# Patient Record
Sex: Female | Born: 1985 | Race: Black or African American | Hispanic: No | Marital: Single | Smoking: Former smoker
Health system: Southern US, Community
[De-identification: ages and names within clinical notes are randomized; demographics above are authoritative.]

## PROBLEM LIST (undated history)

## (undated) ENCOUNTER — Inpatient Hospital Stay (HOSPITAL_COMMUNITY): Payer: Self-pay

## (undated) DIAGNOSIS — F32A Depression, unspecified: Secondary | ICD-10-CM

## (undated) DIAGNOSIS — F419 Anxiety disorder, unspecified: Secondary | ICD-10-CM

## (undated) DIAGNOSIS — R1110 Vomiting, unspecified: Secondary | ICD-10-CM

## (undated) DIAGNOSIS — IMO0001 Reserved for inherently not codable concepts without codable children: Secondary | ICD-10-CM

## (undated) DIAGNOSIS — O3430 Maternal care for cervical incompetence, unspecified trimester: Secondary | ICD-10-CM

## (undated) DIAGNOSIS — A6000 Herpesviral infection of urogenital system, unspecified: Secondary | ICD-10-CM

## (undated) DIAGNOSIS — K219 Gastro-esophageal reflux disease without esophagitis: Secondary | ICD-10-CM

## (undated) DIAGNOSIS — F329 Major depressive disorder, single episode, unspecified: Secondary | ICD-10-CM

## (undated) DIAGNOSIS — B999 Unspecified infectious disease: Secondary | ICD-10-CM

## (undated) DIAGNOSIS — R42 Dizziness and giddiness: Secondary | ICD-10-CM

---
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
## 2010-11-27 ENCOUNTER — Encounter: Payer: Self-pay | Admitting: Obstetrics & Gynecology

## 2011-12-06 ENCOUNTER — Emergency Department (HOSPITAL_COMMUNITY)
Admission: EM | Admit: 2011-12-06 | Discharge: 2011-12-06 | Disposition: A | Discharge status: Home / Self Care | Payer: Medicaid - Out of State | Attending: Emergency Medicine | Admitting: Emergency Medicine

## 2011-12-06 ENCOUNTER — Emergency Department (HOSPITAL_COMMUNITY): Payer: Medicaid - Out of State

## 2011-12-06 ENCOUNTER — Encounter (HOSPITAL_COMMUNITY): Payer: Self-pay | Admitting: *Deleted

## 2011-12-06 DIAGNOSIS — R110 Nausea: Secondary | ICD-10-CM | POA: Insufficient documentation

## 2011-12-06 DIAGNOSIS — H8110 Benign paroxysmal vertigo, unspecified ear: Secondary | ICD-10-CM

## 2011-12-06 DIAGNOSIS — H538 Other visual disturbances: Secondary | ICD-10-CM | POA: Insufficient documentation

## 2011-12-06 MED ORDER — MECLIZINE HCL 25 MG PO TABS: 25.0000 mg | ORAL_TABLET | Freq: Three times a day (TID) | ORAL | Status: AC | PRN

## 2011-12-06 MED ORDER — MECLIZINE HCL 25 MG PO TABS
25.0000 mg | ORAL_TABLET | Freq: Once | ORAL | Status: AC
  Administered 2011-12-06: 25 mg via ORAL
  Filled 2011-12-06: qty 1

## 2011-12-06 MED ORDER — ONDANSETRON 4 MG PO TBDP
4.0000 mg | ORAL_TABLET | Freq: Once | ORAL | Status: AC
  Administered 2011-12-06: 4 mg via ORAL
  Filled 2011-12-06: qty 1

## 2011-12-06 MED ORDER — ONDANSETRON HCL 4 MG PO TABS: 4.0000 mg | ORAL_TABLET | Freq: Four times a day (QID) | ORAL | Status: AC

## 2011-12-06 NOTE — ED Notes (Signed)
Reports having nausea, dizziness, change in vision and hearing x over one year and has been to 3 diff drs for it. No acute distress noted at triage.

## 2011-12-06 NOTE — ED Provider Notes (Signed)
History     CSN: [id number]  Arrival date & time 12/06/11  1751   First MD Initiated Contact with Patient 12/06/11 1819      Chief Complaint  Patient presents with  . Nausea  . Dizziness  . Blurred Vision    (Consider location/radiation/quality/duration/timing/severity/associated sxs/prior treatment) HPI Comments: Patient reports 3 months of nausea, dizziness, blurry vision that occurs intermittently. She seen several doctors for this and told she may have a middle ear problem. She's had recurrent episodes of dizziness with position changes associated with the room spinning. Associated with nausea and blurry vision. No chest pain or shortness of breath or syncope. She denies any weakness, numbness, tingling, headache. She's not take any medications for it. She has not seen an ENT specialist.  The history is provided by the patient.    History reviewed. No pertinent past medical history.  History reviewed. No pertinent past surgical history.  History reviewed. No pertinent family history.  History  Substance Use Topics  . Smoking status: Not on file  . Smokeless tobacco: Not on file  . Alcohol Use: Yes     occ    OB History    Grav Para Term Preterm Abortions TAB SAB Ect Mult Living                  Review of Systems  Constitutional: Negative for fever and activity change.  HENT: Negative for congestion and rhinorrhea.   Eyes: Positive for visual disturbance.  Respiratory: Negative for chest tightness and shortness of breath.   Cardiovascular: Negative for chest pain.  Gastrointestinal: Positive for nausea. Negative for vomiting and abdominal pain.  Genitourinary: Negative for dysuria and hematuria.  Musculoskeletal: Negative for back pain.  Skin: Negative for rash.  Neurological: Positive for dizziness and light-headedness. Negative for weakness.    Allergies  Review of patient's allergies indicates no known allergies.  Home Medications  No current  outpatient prescriptions on file.  BP 135/82  Pulse 91  Temp(Src) 98.2 F (36.8 C) (Oral)  Ht 5\' 6"  (1.676 m)  Wt 290 lb (131.543 kg)  BMI 46.81 kg/m2  SpO2 99%  LMP 12/04/2011  Physical Exam  Constitutional: She is oriented to person, place, and time. She appears well-developed and well-nourished. No distress.  HENT:  Head: Normocephalic and atraumatic.  Mouth/Throat: Oropharynx is clear and moist. No oropharyngeal exudate.  Eyes: Conjunctivae are normal. Pupils are equal, round, and reactive to light.  Neck: Normal range of motion.  Cardiovascular: Normal rate, regular rhythm and normal heart sounds.   Pulmonary/Chest: Effort normal and breath sounds normal. No respiratory distress.  Abdominal: Soft. There is no tenderness. There is no rebound and no guarding.  Musculoskeletal: Normal range of motion. She exhibits no edema and no tenderness.  Neurological: She is alert and oriented to person, place, and time. No cranial nerve deficit.       No face asymmetry, no nystagmus, 5 out of 5 strength throughout, no ataxia finger to nose, normal gait. Catch up saccades on head impulse testing Test of skew negative  Skin: Skin is warm.    ED Course  Procedures (including critical care time)  Labs Reviewed - No data to display Ct Head Wo Contrast  12/06/2011  *RADIOLOGY REPORT*  Clinical Data: Nausea.  Dizziness.  Blurry vision.  CT HEAD WITHOUT CONTRAST  Technique:  Contiguous axial images were obtained from the base of the skull through the vertex without contrast.  Comparison: None  Findings: No mass lesion,  mass effect, midline shift, hydrocephalus, hemorrhage.  No territorial ischemia or acute infarction.  Paranasal sinuses are within normal limits.  IMPRESSION: Negative CT head.  Original Report Authenticated By: Andreas Newport, M.D.     No diagnosis found.    MDM  Intermittent vertigo, nausea, change in vision. And off problem for several months. Normal neurological exam  today. Catch up saccades suggestive of peripheral vestibular nerve problem We'll treat as benign positional vertigo and followup with ENT.  CT head negative symptoms improved with meclizine. No further dizziness. Normal gait.   Date: 12/06/2011  Rate: 82  Rhythm: normal sinus rhythm  QRS Axis: normal  Intervals: normal  ST/T Wave abnormalities: normal  Conduction Disutrbances:none  Narrative Interpretation:   Old EKG Reviewed: unchanged         Glynn Octave, MD 12/06/11 1939

## 2011-12-06 NOTE — ED Notes (Signed)
Patient transported to CT 

## 2012-01-28 ENCOUNTER — Emergency Department (HOSPITAL_COMMUNITY)
Admission: EM | Admit: 2012-01-28 | Discharge: 2012-01-29 | Disposition: A | Discharge status: Home / Self Care | Payer: Medicaid Other | Attending: Emergency Medicine | Admitting: Emergency Medicine

## 2012-01-28 ENCOUNTER — Encounter (HOSPITAL_COMMUNITY): Payer: Self-pay | Admitting: Emergency Medicine

## 2012-01-28 DIAGNOSIS — N939 Abnormal uterine and vaginal bleeding, unspecified: Secondary | ICD-10-CM

## 2012-01-28 DIAGNOSIS — A599 Trichomoniasis, unspecified: Secondary | ICD-10-CM | POA: Insufficient documentation

## 2012-01-28 DIAGNOSIS — N898 Other specified noninflammatory disorders of vagina: Secondary | ICD-10-CM | POA: Insufficient documentation

## 2012-01-28 DIAGNOSIS — IMO0001 Reserved for inherently not codable concepts without codable children: Secondary | ICD-10-CM | POA: Insufficient documentation

## 2012-01-28 HISTORY — DX: Reserved for inherently not codable concepts without codable children: IMO0001

## 2012-01-28 LAB — URINALYSIS, ROUTINE W REFLEX MICROSCOPIC
Bilirubin Urine: NEGATIVE
Glucose, UA: NEGATIVE mg/dL
Ketones, ur: NEGATIVE mg/dL
Nitrite: NEGATIVE
Protein, ur: NEGATIVE mg/dL
Specific Gravity, Urine: 1.028 (ref 1.005–1.030)
Urobilinogen, UA: 1 mg/dL (ref 0.0–1.0)
pH: 6.5 (ref 5.0–8.0)

## 2012-01-28 LAB — GLUCOSE, CAPILLARY: Glucose-Capillary: 93 mg/dL (ref 70–99)

## 2012-01-28 LAB — URINE MICROSCOPIC-ADD ON

## 2012-01-28 LAB — POCT PREGNANCY, URINE: Preg Test, Ur: NEGATIVE

## 2012-01-28 NOTE — ED Notes (Signed)
Patient states that she has been vaginal bleeding and "spotting" for the past six months; patient also reports UTI symptoms that started yesterday -- patient reports severe itching and burning and that she has taken AZO.  Patient states that she has had several relatives that have issues with spotting and needed to have surgery to alleviate symptoms. Patient reports that she does not have a primary care physician and wants to be worked up for both issues.

## 2012-01-29 LAB — WET PREP, GENITAL: Yeast Wet Prep HPF POC: NONE SEEN

## 2012-01-29 MED ORDER — LIDOCAINE HCL (PF) 1 % IJ SOLN
INTRAMUSCULAR | Status: AC
  Administered 2012-01-29: 2.1 mL
  Filled 2012-01-29: qty 5

## 2012-01-29 MED ORDER — AZITHROMYCIN 250 MG PO TABS
1000.0000 mg | ORAL_TABLET | Freq: Once | ORAL | Status: AC
  Administered 2012-01-29: 1000 mg via ORAL
  Filled 2012-01-29: qty 4

## 2012-01-29 MED ORDER — CEFTRIAXONE SODIUM 250 MG IJ SOLR
250.0000 mg | Freq: Once | INTRAMUSCULAR | Status: AC
  Administered 2012-01-29: 250 mg via INTRAMUSCULAR
  Filled 2012-01-29: qty 250

## 2012-01-29 MED ORDER — METRONIDAZOLE 500 MG PO TABS
2000.0000 mg | ORAL_TABLET | Freq: Once | ORAL | Status: AC
  Administered 2012-01-29: 2000 mg via ORAL
  Filled 2012-01-29: qty 4

## 2012-01-29 NOTE — ED Notes (Signed)
Natalia Leatherwood, FNP at bedside with pelvic cart and RN.

## 2012-01-29 NOTE — ED Notes (Signed)
Patient currently resting quietly in bed; no respiratory or acute distress noted.  Patient updated on plan of care; informed patient that we are currently waiting on EDP to come and assess patient.  Patient has no other questions or concerns at this time; will continue to monitor.

## 2012-01-29 NOTE — ED Notes (Signed)
Pt states understanding of discharge instructions 

## 2012-01-29 NOTE — ED Notes (Signed)
Patient currently sitting up in bed; no respiratory or acute distress noted.  Patient updated on plan of care; informed patient that we are currently waiting on disposition from FNP.  Patient has no other questions or concerns at this time; will continue to monitor.

## 2012-01-29 NOTE — ED Notes (Signed)
Patient currently sitting up in bed; no respiratory or acute distress noted.  Patient moved to CDU 11 to prepare for pelvic exam; pelvic cart set up at bedside.  Natalia Leatherwood, FNP at bedside.  Patient has no other questions or concerns at this time; will continue to monitor.

## 2012-01-29 NOTE — Discharge Instructions (Signed)
Please review the instructions below. You were seen in the emergency department tonight for your vaginal discharge, irritation  and persistent vaginal bleeding. Your pelvic exam tonight shows that you have Trichomonas. We have treated that infection here in the emergency department. You are having a small to moderate amount of bleeding,  your remaining pelvic exam appeared normal. You will need to arrange follow up with a gynecologist or the [facility] for further evaluation of your abnormal vaginal bleeding. We also encourage you to arrange follow up at the [facility] Department STD clinic for further screening such as Syphilis and HIV screening. Return to the emergency department for worsening symptoms otherwise follow up as discussed.   Trichomoniasis Trichomoniasis is an infection, caused by the Trichomonas organism, that affects both women and men. In women, the outer female genitalia and the vagina are affected. In men, the penis is mainly affected, but the prostate and other reproductive organs can also be involved. Trichomoniasis is a sexually transmitted disease (STD) and is most often passed to another person through sexual contact. The majority of people who get trichomoniasis do so from a sexual encounter and are also at risk for other STDs. CAUSES   Sexual intercourse with an infected partner.   It can be present in swimming pools or hot tubs.  SYMPTOMS   Abnormal gray-green frothy vaginal discharge in women.   Vaginal itching and irritation in women.   Itching and irritation of the area outside the vagina in women.   Penile discharge with or without pain in males.   Inflammation of the urethra (urethritis), causing painful urination.   Bleeding after sexual intercourse.  RELATED COMPLICATIONS  Pelvic inflammatory disease.   Infection of the uterus (endometritis).   Infertility.   Tubal (ectopic) pregnancy.   It can be associated with other  STDs, including gonorrhea and chlamydia, hepatitis B, and HIV.  COMPLICATIONS DURING PREGNANCY  Early (premature) delivery.   Premature rupture of the membranes (PROM).   Low birth weight.  DIAGNOSIS   Visualization of Trichomonas under the microscope from the vagina discharge.   Ph of the vagina greater than 4.5, tested with a test tape.   Trich Rapid Test.   Culture of the organism, but this is not usually needed.   It may be found on a Pap test.   Having a "strawberry cervix,"which means the cervix looks very red like a strawberry.  TREATMENT   You may be given medication to fight the infection. Inform your caregiver if you could be or are pregnant. Some medications used to treat the infection should not be taken during pregnancy.   Over-the-counter medications or creams to decrease itching or irritation may be recommended.   Your sexual partner will need to be treated if infected.  HOME CARE INSTRUCTIONS   Take all medication prescribed by your caregiver.   Take over-the-counter medication for itching or irritation as directed by your caregiver.   Do not have sexual intercourse while you have the infection.   Do not douche or wear tampons.   Discuss your infection with your partner, as your partner may have acquired the infection from you. Or, your partner may have been the person who transmitted the infection to you.   Have your sex partner examined and treated if necessary.   Practice safe, informed, and protected sex.   See your caregiver for other STD testing.  SEEK MEDICAL CARE IF:   You still have symptoms after you finish the  medication.   You have an oral temperature above 102 F (38.9 C).   You develop belly (abdominal) pain.   You have pain when you urinate.   You have bleeding after sexual intercourse.   You develop a rash.   The medication makes you sick or makes you throw up (vomit).  Document Released: 04/18/2001 Document Revised:  10/12/2011 Document Reviewed: 05/14/2009 [facility] Patient Information 2012 [place], [place].Abnormal Vaginal Bleeding Abnormal vaginal bleeding means bleeding from the vagina that is not your normal menstrual period. Bleeding may be heavy or light. It may last for days or come and go. There are many problems that may cause this. HOME CARE  Keep track of your periods on a calendar if they are not regular.   Write down:   How often pads or tampons are changed.   The size and number of clots, if there are any.   A change in the color of the blood.   A change in the amount of blood.   Any smell.   The time and strength of cramps or pain.   Limit activity as told.   Eat a healthy diet.   Do not have sex (intercourse) until your doctor says it is okay.   Never have unprotected sex unless you are trying to get pregnant.   Only take medicine as told by your doctor.  GET HELP RIGHT AWAY IF:   You get dizzy or feel faint when standing up.   You have to change pads or tampons more than once an hour.   You feel a sudden change in your pain.   You start bleeding heavily.   You develop a fever.  MAKE SURE YOU:  Understand these instructions.   Will watch your condition.   Will get help right away if you are not doing well or get worse.  Document Released: 08/20/2009 Document Revised: 10/12/2011 Document Reviewed: 08/20/2009 [facility] Patient Information 2012 [place], [place].Abnormal Vaginal Bleeding Abnormal vaginal bleeding means bleeding from the vagina that is not your normal menstrual period. Bleeding may be heavy or light. It may last for days or come and go. There are many problems that may cause this. HOME CARE  Keep track of your periods on a calendar if they are not regular.   Write down:   How often pads or tampons are changed.   The size and number of clots, if there are any.   A change in the color of the blood.   A change in the amount of blood.   Any  smell.   The time and strength of cramps or pain.   Limit activity as told.   Eat a healthy diet.   Do not have sex (intercourse) until your doctor says it is okay.   Never have unprotected sex unless you are trying to get pregnant.   Only take medicine as told by your doctor.  GET HELP RIGHT AWAY IF:   You get dizzy or feel faint when standing up.   You have to change pads or tampons more than once an hour.   You feel a sudden change in your pain.   You start bleeding heavily.   You develop a fever.  MAKE SURE YOU:  Understand these instructions.   Will watch your condition.   Will get help right away if you are not doing well or get worse.  Document Released: 08/20/2009 Document Revised: 10/12/2011 Document Reviewed: 08/20/2009 [facility] Patient Information 2012 [place], [place].

## 2012-01-29 NOTE — ED Notes (Signed)
Patient currently sitting up in bed; no respiratory or acute distress noted.  Patient updated on plan of care; informed patient that Samantha Leatherwood, FNP will be in to talk with patient.  No new orders at this time.  Patient denies pain and nausea at this time.  Patient has no other questions or concerns; will continue to monitor.

## 2012-01-29 NOTE — ED Provider Notes (Signed)
History     CSN: [id number]  Arrival date & time 01/28/12  2047   First MD Initiated Contact with Patient 01/28/12 2344      Chief Complaint  Patient presents with  . Vaginal Bleeding    HPI: Patient is a 26 y.o. female presenting with vaginal bleeding. The history is provided by the patient.  Vaginal Bleeding This is a chronic problem. The current episode started more than 1 month ago. The problem occurs intermittently. The problem has been unchanged. Associated symptoms include urinary symptoms. Pertinent negatives include no abdominal pain, change in bowel habit, fever, nausea or vomiting. The symptoms are aggravated by nothing. She has tried nothing for the symptoms.   patient reports vaginal irritation and itching for approximately 2 months. She recently has had increased dysuria. Admits to recent unprotected intercourse. Patient also reports a several month history of dysfunctional uterine bleeding. States she has bleeding every 2 to 3 weeks for months. Denies significant abd pain. States that this is not new but she would like to be evaluated for. Denies any significant abdominal pain. Denies fever, nausea, vomiting or other associated symptoms.   Past Medical History  Diagnosis Date  . No significant past medical history     History reviewed. No pertinent past surgical history.  History reviewed. No pertinent family history.  History  Substance Use Topics  . Smoking status: Not on file  . Smokeless tobacco: Not on file  . Alcohol Use: Yes     occ    OB History    Grav Para Term Preterm Abortions TAB SAB Ect Mult Living                  Review of Systems  Constitutional: Negative.  Negative for fever.  HENT: Negative.   Eyes: Negative.   Respiratory: Negative.   Cardiovascular: Negative.   Gastrointestinal: Negative.  Negative for nausea, vomiting, abdominal pain and change in bowel habit.  Genitourinary: Positive for dysuria and vaginal bleeding.    Musculoskeletal: Negative.   Skin: Negative.   Neurological: Negative.   Hematological: Negative.   Psychiatric/Behavioral: Negative.     Allergies  Review of patient's allergies indicates no known allergies.  Home Medications  No current outpatient prescriptions on file.  BP 125/83  Pulse 60  Temp(Src) 97.2 F (36.2 C) (Oral)  Resp 19  SpO2 99%  LMP 12/29/2011  Physical Exam  Constitutional: She is oriented to person, place, and time. She appears well-developed and well-nourished.  HENT:  Head: Normocephalic and atraumatic.  Eyes: Conjunctivae are normal.  Neck: Neck supple.  Cardiovascular: Normal rate and regular rhythm.   Pulmonary/Chest: Effort normal and breath sounds normal.  Abdominal: Soft. Bowel sounds are normal. Hernia confirmed negative in the right inguinal area and confirmed negative in the left inguinal area.  Genitourinary: Uterus normal. Cervix exhibits no motion tenderness, no discharge and no friability. Right adnexum displays no mass, no tenderness and no fullness. Left adnexum displays no mass, no tenderness and no fullness. There is bleeding around the vagina. No tenderness around the vagina. Vaginal discharge found.       Mod amount vag bleeding  Musculoskeletal: Normal range of motion.  Lymphadenopathy:       Right: No inguinal adenopathy present.       Left: No inguinal adenopathy present.  Neurological: She is alert and oriented to person, place, and time.  Skin: Skin is warm and dry. No erythema.  Psychiatric: She has a normal mood and affect.  ED Course  Pelvic exam Date/Time: 01/29/2012 1:40 AM Performed by: Leanne Chang Authorized by: Leanne Chang Consent: Verbal consent obtained. Risks and benefits: risks, benefits and alternatives were discussed Consent given by: patient Patient understanding: patient states understanding of the procedure being performed Required items: required blood products, implants, devices, and  special equipment available Patient identity confirmed: verbally with patient and arm band Local anesthesia used: no Patient sedated: no Patient tolerance: Patient tolerated the procedure well with no immediate complications.  Findings and clinical impression discussed w/ pt. Will treat for Trich and encourage close f/u for further screening at [facility] and at [facility] for further eval of her abnormal vaginal bleeding. Pt agreeable w/ plan.  Labs Reviewed  URINALYSIS, ROUTINE W REFLEX MICROSCOPIC - Abnormal; Notable for the following:    APPearance CLOUDY (*)    Hgb urine dipstick MODERATE (*)    Leukocytes, UA MODERATE (*)    All other components within normal limits  URINE MICROSCOPIC-ADD ON - Abnormal; Notable for the following:    Squamous Epithelial / LPF FEW (*)    All other components within normal limits  WET PREP, GENITAL - Abnormal; Notable for the following:    Trich, Wet Prep MODERATE (*)    Clue Cells Wet Prep HPF POC FEW (*)    WBC, Wet Prep HPF POC FEW (*)    All other components within normal limits  POCT PREGNANCY, URINE  GLUCOSE, CAPILLARY  GC/CHLAMYDIA PROBE AMP, GENITAL   No results found.   1. Trichomoniasis   2. Abnormal vaginal bleeding       MDM  HPI/PE and clinical findings c/w 1. Trichomonas vaginitis 2. Abnormal vaginal bleeding        Leanne Chang, NP 01/31/12 1401

## 2012-01-30 LAB — GC/CHLAMYDIA PROBE AMP, GENITAL
Chlamydia, DNA Probe: NEGATIVE
GC Probe Amp, Genital: NEGATIVE

## 2012-01-31 NOTE — ED Provider Notes (Signed)
Medical screening examination/treatment/procedure(s) were performed by non-physician practitioner and as supervising physician I was immediately available for consultation/collaboration.  Vallerie Hentz K Adriena Manfre-Rasch, MD 01/31/12 2301 

## 2012-02-20 ENCOUNTER — Emergency Department (INDEPENDENT_AMBULATORY_CARE_PROVIDER_SITE_OTHER)
Admission: EM | Admit: 2012-02-20 | Discharge: 2012-02-20 | Disposition: A | Discharge status: Home / Self Care | Payer: Self-pay | Source: Home / Self Care | Attending: Emergency Medicine | Admitting: Emergency Medicine

## 2012-02-20 ENCOUNTER — Encounter (HOSPITAL_COMMUNITY): Payer: Self-pay | Admitting: *Deleted

## 2012-02-20 DIAGNOSIS — N926 Irregular menstruation, unspecified: Secondary | ICD-10-CM

## 2012-02-20 DIAGNOSIS — N938 Other specified abnormal uterine and vaginal bleeding: Secondary | ICD-10-CM

## 2012-02-20 HISTORY — DX: Dizziness and giddiness: R42

## 2012-02-20 LAB — POCT PREGNANCY, URINE: Preg Test, Ur: NEGATIVE

## 2012-02-20 LAB — POCT URINALYSIS DIP (DEVICE)
Glucose, UA: NEGATIVE mg/dL
Hgb urine dipstick: NEGATIVE
Ketones, ur: NEGATIVE mg/dL
Leukocytes, UA: NEGATIVE
Nitrite: NEGATIVE
Protein, ur: 30 mg/dL — AB
Specific Gravity, Urine: >=1.03 (ref 1.005–1.030)
Urobilinogen, UA: 0.2 mg/dL (ref 0.0–1.0)
pH: 6 (ref 5.0–8.0)

## 2012-02-20 LAB — WET PREP, GENITAL
Trich, Wet Prep: NONE SEEN
Yeast Wet Prep HPF POC: NONE SEEN

## 2012-02-20 MED ORDER — NORGESTIM-ETH ESTRAD TRIPHASIC 0.18/0.215/0.25 MG-35 MCG PO TABS: 1.0000 | ORAL_TABLET | Freq: Every day | ORAL | Status: DC

## 2012-02-20 NOTE — Discharge Instructions (Signed)
Abnormal Uterine Bleeding Abnormal uterine bleeding can have many causes. Some cases are simply treated, while others are more serious. There are several kinds of bleeding that is considered abnormal, including:  Bleeding between periods.   Bleeding after sexual intercourse.   Spotting anytime in the menstrual cycle.   Bleeding heavier or more than normal.   Bleeding after menopause.  CAUSES  There are many causes of abnormal uterine bleeding. It can be present in teenagers, pregnant women, women during their reproductive years, and women who have reached menopause. Your caregiver will look for the more common causes depending on your age, signs, symptoms and your particular circumstance. Most cases are not serious and can be treated. Even the more serious causes, like cancer of the female organs, can be treated adequately if found in the early stages. That is why all types of bleeding should be evaluated and treated as soon as possible. DIAGNOSIS  Diagnosing the cause may take several kinds of tests. Your caregiver may:  Take a complete history of the type of bleeding.   Perform a complete physical exam and Pap smear.   Take an ultrasound on the abdomen showing a picture of the female organs and the pelvis.   Inject dye into the uterus and Fallopian tubes and X-ray them (hysterosalpingogram).   Place fluid in the uterus and do an ultrasound (sonohysterogrqphy).   Take a CT scan to examine the female organs and pelvis.   Take an MRI to examine the female organs and pelvis. There is no X-ray involved with this procedure.   Look inside the uterus with a telescope that has a light at the end (hysteroscopy).   Scrap the inside of the uterus to get tissue to examine (Dilatation and Curettage, D&C).   Look into the pelvis with a telescope that has a light at the end (laparoscopy). This is done through a very small cut (incision) in the abdomen.  TREATMENT  Treatment will depend on the  cause of the abnormal bleeding. It can include:  Doing nothing to allow the problem to take care of itself over time.   Hormone treatment.   Birth control pills.   Treating the medical condition causing the problem.   Laparoscopy.   Major or minor surgery   Destroying the lining of the uterus with electrical currant, laser, freezing or heat (uterine ablation).  HOME CARE INSTRUCTIONS   Follow your caregiver's recommendation on how to treat your problem.   See your caregiver if you missed a menstrual period and think you may be pregnant.   If you are bleeding heavily, count the number of pads/tampons you use and how often you have to change them. Tell this to your caregiver.   Avoid sexual intercourse until the problem is controlled.  SEEK MEDICAL CARE IF:   You have any kind of abnormal bleeding mentioned above.   You feel dizzy at times.   You are 26 years old and have not had a menstrual period yet.  SEEK IMMEDIATE MEDICAL CARE IF:   You pass out.   You are changing pads/tampons every 15 to 30 minutes.   You have belly (abdominal) pain.   You have a temperature of 100 F (37.8 C) or higher.   You become sweaty or weak.   You are passing large blood clots from the vagina.   You start to feel sick to your stomach (nauseous) and throw up (vomit).  Document Released: 10/23/2005 Document Revised: 10/12/2011 Document Reviewed: 03/18/2009 [employer]   Patient Information 2012 [employer]. 

## 2012-02-20 NOTE — ED Notes (Signed)
4 months of irregular vaginal bleeding which has gotten worse.    3 days sago she started having fever/chills N, V and D . Today she has stopped vomiting and has had 1 loose stool

## 2012-02-20 NOTE — ED Provider Notes (Signed)
Chief Complaint  Patient presents with  . Vaginal Bleeding    History of Present Illness:   Samantha Guzman is a 26 year old female who comes in today because of a 4 month history of irregular vaginal bleeding. Sometimes she has spotting and sometimes a normal menstrual flow, but this can occur randomly throughout the month with no particular pattern. She denies any pain or passage of tissue or clots. She has not been sexually active for about a year. She did have STD testing about a month ago this was all negative. The patient recalls she had something similar several years ago and had to take birth control pills to control her menses. She states the birth control pills does work quite well and while she was on them she had very regular periods.  She also had what she thinks was food poisoning from Saturday March 13 through Monday, March 15 with nausea, vomiting, and diarrhea. She also felt fatigued and chilled. She had eaten at a [place] buffet prior to onset of symptoms.  Review of Systems:  Other than noted above, the patient denies any of the following symptoms: Systemic:  No fever, chills, sweats, fatigue, or weight loss. GI:  No abdominal pain, nausea, anorexia, vomiting, diarrhea, constipation, melena or hematochezia. GU:  No dysuria, frequency, urgency, hematuria, vaginal discharge, itching, or abnormal vaginal bleeding. Skin:  No rash or itching.   PMFSH:  Past medical history, family history, social history, meds, and allergies were reviewed.  Physical Exam:   Vital signs:  BP 110/62  Pulse 78  Temp(Src) 98.5 F (36.9 C) (Oral)  Resp 16  SpO2 98%  LMP 11/28/2011 General:  Alert, oriented and in no distress. Lungs:  Breath sounds clear and equal bilaterally.  No wheezes, rales or rhonchi. Heart:  Regular rhythm.  No gallops or murmers. Abdomen:  Soft, flat and non-distended.  No organomegaly or mass.  No tenderness, guarding or rebound.  Bowel sounds normally active. Pelvic exam:   Normal external genitalia. Vaginal and cervical mucosa unremarkable. There was a small amount of blood in the vaginal vault. No clots or tissue. The cervix appeared normal. No cervical motion tenderness. Uterus was anterior, normal in size and shape without any tenderness or masses. No adnexal tenderness or mass. Skin:  Clear, warm and dry.  Labs:   Results for orders placed during the hospital encounter of 02/20/12  POCT URINALYSIS DIP (DEVICE)      Component Value Range   Glucose, UA NEGATIVE  NEGATIVE (mg/dL)   Bilirubin Urine SMALL (*) NEGATIVE    Ketones, ur NEGATIVE  NEGATIVE (mg/dL)   Specific Gravity, Urine >=1.030  1.005 - 1.030    Hgb urine dipstick NEGATIVE  NEGATIVE    pH 6.0  5.0 - 8.0    Protein, ur 30 (*) NEGATIVE (mg/dL)   Urobilinogen, UA 0.2  0.0 - 1.0 (mg/dL)   Nitrite NEGATIVE  NEGATIVE    Leukocytes, UA NEGATIVE  NEGATIVE     Other Labs Obtained at Urgent Care Center:  GC and Chlamydia DNA probe was obtained as well.  Results are pending at this time and we will call about any positive results. Her urine pregnancy test was negative.  Assessment:  The encounter diagnosis was Dysfunctional uterine bleeding.  Plan:   1.  The following meds were prescribed:   New Prescriptions   NORGESTIMATE-ETHINYL ESTRADIOL TRIPHASIC (TRI-SPRINTEC) 0.18/0.215/0.25 MG-35 MCG TABLET    Take 1 tablet by mouth daily.   2.  The patient was instructed in  symptomatic care and handouts were given. 3.  The patient was told to return if becoming worse in any way, if no better in 3 or 4 days, and given some red flag symptoms that would indicate earlier return.  Follow up:  The patient was told to follow up with Dr.Lavoie within the next month.     Reuben Likes, MD 02/20/12 [phone]

## 2012-02-21 LAB — GC/CHLAMYDIA PROBE AMP, GENITAL: Chlamydia, DNA Probe: NEGATIVE

## 2012-03-18 ENCOUNTER — Encounter (HOSPITAL_COMMUNITY): Payer: Self-pay | Admitting: Emergency Medicine

## 2012-03-18 ENCOUNTER — Emergency Department (INDEPENDENT_AMBULATORY_CARE_PROVIDER_SITE_OTHER)
Admission: EM | Admit: 2012-03-18 | Discharge: 2012-03-18 | Disposition: A | Discharge status: Home / Self Care | Payer: Self-pay | Source: Home / Self Care | Attending: Emergency Medicine | Admitting: Emergency Medicine

## 2012-03-18 DIAGNOSIS — R300 Dysuria: Secondary | ICD-10-CM

## 2012-03-18 LAB — POCT URINALYSIS DIP (DEVICE)
Bilirubin Urine: NEGATIVE
Glucose, UA: NEGATIVE mg/dL
Hgb urine dipstick: NEGATIVE
Ketones, ur: NEGATIVE mg/dL
Leukocytes, UA: NEGATIVE
Nitrite: NEGATIVE
Protein, ur: NEGATIVE mg/dL
Specific Gravity, Urine: 1.025 (ref 1.005–1.030)
Urobilinogen, UA: 0.2 mg/dL (ref 0.0–1.0)
pH: 6 (ref 5.0–8.0)

## 2012-03-18 LAB — POCT PREGNANCY, URINE: Preg Test, Ur: NEGATIVE

## 2012-03-18 LAB — WET PREP, GENITAL

## 2012-03-18 MED ORDER — CEPHALEXIN 500 MG PO CAPS: 500.0000 mg | ORAL_CAPSULE | Freq: Three times a day (TID) | ORAL | Status: AC

## 2012-03-18 NOTE — Discharge Instructions (Signed)

## 2012-03-18 NOTE — ED Provider Notes (Signed)
Chief Complaint  Patient presents with  . Urinary Tract Infection  . SEXUALLY TRANSMITTED DISEASE    History of Present Illness:   Samantha Guzman is a 26 year old female who presents today with irritative voiding symptoms with bladder pressure and urgency. She's had slight dysuria and frequency but no hematuria. She denies any suprapubic or lower back pain. No fever, chills, nausea, or vomiting. She denies any vaginal discharge or itching. Her menses are now regular with the birth control pills. She was here about a month ago because of your her menses and she was switched to try Tri-Sprintec and she feels these are working very well and she is pleased with the results. She plans to see a gynecologist sometime in the next few months.  Review of Systems:  Other than noted above, the patient denies any of the following symptoms: Systemic:  No fever, chills, sweats, fatigue, or weight loss. GI:  No abdominal pain, nausea, anorexia, vomiting, diarrhea, constipation, melena or hematochezia. GU:  No dysuria, frequency, urgency, hematuria, vaginal discharge, itching, or abnormal vaginal bleeding. Skin:  No rash or itching.   PMFSH:  Past medical history, family history, social history, meds, and allergies were reviewed.  Physical Exam:   Vital signs:  BP 127/73  Pulse 90  Temp(Src) 98.3 F (36.8 C) (Oral)  Resp 16  SpO2 97%  LMP 03/06/2012 General:  Alert, oriented and in no distress. Lungs:  Breath sounds clear and equal bilaterally.  No wheezes, rales or rhonchi. Heart:  Regular rhythm.  No gallops or murmers. Abdomen:  Soft, flat and non-distended.  No organomegaly or mass.  No tenderness, guarding or rebound.  Bowel sounds normally active. Pelvic exam:  Normal external genitalia, vaginal and cervical mucosa are normal. No cervical motion tenderness. Uterus is normal in size and nontender. No adnexal masses or tenderness. Skin:  Clear, warm and dry.  Labs:   Results for orders placed during the  hospital encounter of 03/18/12  WET PREP, GENITAL      Component Value Range   Yeast Wet Prep HPF POC NONE SEEN  NONE SEEN    Trich, Wet Prep NONE SEEN  NONE SEEN    Clue Cells Wet Prep HPF POC FEW (*) NONE SEEN    WBC, Wet Prep HPF POC NONE SEEN  NONE SEEN   POCT URINALYSIS DIP (DEVICE)      Component Value Range   Glucose, UA NEGATIVE  NEGATIVE (mg/dL)   Bilirubin Urine NEGATIVE  NEGATIVE    Ketones, ur NEGATIVE  NEGATIVE (mg/dL)   Specific Gravity, Urine 1.025  1.005 - 1.030    Hgb urine dipstick NEGATIVE  NEGATIVE    pH 6.0  5.0 - 8.0    Protein, ur NEGATIVE  NEGATIVE (mg/dL)   Urobilinogen, UA 0.2  0.0 - 1.0 (mg/dL)   Nitrite NEGATIVE  NEGATIVE    Leukocytes, UA NEGATIVE  NEGATIVE   POCT PREGNANCY, URINE      Component Value Range   Preg Test, Ur NEGATIVE  NEGATIVE      Assessment:  The encounter diagnosis was Dysuria. Even though her UA doesn't show much, I went ahead and did a culture and we'll go ahead and start with antibiotics since she has symptoms suggestive of an early urinary tract infection.  Plan:   1.  The following meds were prescribed:   New Prescriptions   CEPHALEXIN (KEFLEX) 500 MG CAPSULE    Take 1 capsule (500 mg total) by mouth 3 (three) times daily.  2.  The patient was instructed in symptomatic care and handouts were given. 3.  The patient was told to return if becoming worse in any way, if no better in 3 or 4 days, and given some red flag symptoms that would indicate earlier return.    Reuben Likes, MD 03/18/12 1346

## 2012-03-18 NOTE — ED Notes (Signed)
PT HERE WITH URINE FREQ/URGE AND IRRITATION THAT STARTED YESTERDAY.DENIES BURN/ITCH OR FEVERS.LMP 03/06/12.PT ALSO REQUESTING STD TESTING DUE TO POS TRICHOMONAS LAST MNTH FROM BOYFRIEND.DENIES VAG D/C

## 2012-03-19 LAB — URINE CULTURE
Colony Count: 30000
Culture  Setup Time: 201305131240

## 2012-03-19 LAB — GC/CHLAMYDIA PROBE AMP, GENITAL: Chlamydia, DNA Probe: NEGATIVE

## 2012-03-21 NOTE — ED Notes (Signed)
Urine culture: 30,000 colonies Lactobacillus species.  Pt. treated with Keflex.  No sensitivity. Lab shown to Dr. Lorenz Coaster and he said it is normal flora- no further action needed. Samantha Guzman 03/21/2012

## 2012-05-03 ENCOUNTER — Emergency Department (HOSPITAL_COMMUNITY)
Admission: EM | Admit: 2012-05-03 | Discharge: 2012-05-03 | Disposition: A | Discharge status: Home / Self Care | Payer: Medicaid Other | Attending: Emergency Medicine | Admitting: Emergency Medicine

## 2012-05-03 ENCOUNTER — Encounter (HOSPITAL_COMMUNITY): Payer: Self-pay

## 2012-05-03 ENCOUNTER — Emergency Department (HOSPITAL_COMMUNITY): Payer: Medicaid Other

## 2012-05-03 DIAGNOSIS — N72 Inflammatory disease of cervix uteri: Secondary | ICD-10-CM

## 2012-05-03 DIAGNOSIS — A599 Trichomoniasis, unspecified: Secondary | ICD-10-CM

## 2012-05-03 DIAGNOSIS — N390 Urinary tract infection, site not specified: Secondary | ICD-10-CM

## 2012-05-03 DIAGNOSIS — A5909 Other urogenital trichomoniasis: Secondary | ICD-10-CM | POA: Insufficient documentation

## 2012-05-03 LAB — URINALYSIS, ROUTINE W REFLEX MICROSCOPIC
Bilirubin Urine: NEGATIVE
Glucose, UA: NEGATIVE mg/dL
Hgb urine dipstick: NEGATIVE
Protein, ur: NEGATIVE mg/dL

## 2012-05-03 LAB — WET PREP, GENITAL: Clue Cells Wet Prep HPF POC: NONE SEEN

## 2012-05-03 MED ORDER — CEPHALEXIN 500 MG PO CAPS: 500.0000 mg | ORAL_CAPSULE | Freq: Four times a day (QID) | ORAL | Status: AC

## 2012-05-03 MED ORDER — METRONIDAZOLE 500 MG PO TABS
2000.0000 mg | ORAL_TABLET | Freq: Once | ORAL | Status: AC
  Administered 2012-05-03: 2000 mg via ORAL
  Filled 2012-05-03: qty 4

## 2012-05-03 MED ORDER — CEFTRIAXONE SODIUM 250 MG IJ SOLR
250.0000 mg | Freq: Once | INTRAMUSCULAR | Status: AC
  Administered 2012-05-03: 250 mg via INTRAMUSCULAR
  Filled 2012-05-03: qty 250

## 2012-05-03 MED ORDER — AZITHROMYCIN 250 MG PO TABS
1000.0000 mg | ORAL_TABLET | Freq: Once | ORAL | Status: AC
  Administered 2012-05-03: 1000 mg via ORAL
  Filled 2012-05-03: qty 4

## 2012-05-03 NOTE — Discharge Instructions (Signed)
Your urine looks contaminated, take keflex as prescribed until all gone. You were treated today for trichomonas and other possible sexually transmitted infections. Make sure to avoid intercourse for 5 days, make sure your partner is treated as well. Follow up with your doctor or health department for recheck in 1 week.    Trichomoniasis Trichomoniasis is an infection, caused by the Trichomonas organism, that affects both women and men. In women, the outer female genitalia and the vagina are affected. In men, the penis is mainly affected, but the prostate and other reproductive organs can also be involved. Trichomoniasis is a sexually transmitted disease (STD) and is most often passed to another person through sexual contact. The majority of people who get trichomoniasis do so from a sexual encounter and are also at risk for other STDs. CAUSES   Sexual intercourse with an infected partner.   It can be present in swimming pools or hot tubs.  SYMPTOMS   Abnormal gray-green frothy vaginal discharge in women.   Vaginal itching and irritation in women.   Itching and irritation of the area outside the vagina in women.   Penile discharge with or without pain in males.   Inflammation of the urethra (urethritis), causing painful urination.   Bleeding after sexual intercourse.  RELATED COMPLICATIONS  Pelvic inflammatory disease.   Infection of the uterus (endometritis).   Infertility.   Tubal (ectopic) pregnancy.   It can be associated with other STDs, including gonorrhea and chlamydia, hepatitis B, and HIV.  COMPLICATIONS DURING PREGNANCY  Early (premature) delivery.   Premature rupture of the membranes (PROM).   Low birth weight.  DIAGNOSIS   Visualization of Trichomonas under the microscope from the vagina discharge.   Ph of the vagina greater than 4.5, tested with a test tape.   Trich Rapid Test.   Culture of the organism, but this is not usually needed.   It may be found  on a Pap test.   Having a "strawberry cervix,"which means the cervix looks very red like a strawberry.  TREATMENT   You may be given medication to fight the infection. Inform your caregiver if you could be or are pregnant. Some medications used to treat the infection should not be taken during pregnancy.   Over-the-counter medications or creams to decrease itching or irritation may be recommended.   Your sexual partner will need to be treated if infected.  HOME CARE INSTRUCTIONS   Take all medication prescribed by your caregiver.   Take over-the-counter medication for itching or irritation as directed by your caregiver.   Do not have sexual intercourse while you have the infection.   Do not douche or wear tampons.   Discuss your infection with your partner, as your partner may have acquired the infection from you. Or, your partner may have been the person who transmitted the infection to you.   Have your sex partner examined and treated if necessary.   Practice safe, informed, and protected sex.   See your caregiver for other STD testing.  SEEK MEDICAL CARE IF:   You still have symptoms after you finish the medication.   You have an oral temperature above 102 F (38.9 C).   You develop belly (abdominal) pain.   You have pain when you urinate.   You have bleeding after sexual intercourse.   You develop a rash.   The medication makes you sick or makes you throw up (vomit).  Document Released: 04/18/2001 Document Revised: 10/12/2011 Document Reviewed: 05/14/2009 [employer] Patient  Information 2012 [place], [place]. Cervicitis Cervicitis is a soreness and swelling (inflammation) of the cervix. Your cervix is located at the bottom of your uterus which opens up to the vagina.  CAUSES   Sexually transmitted infections (STIs).   Allergic reaction.   Medicines or birth control devices that are put in the vagina.   Injury to the cervix.   Bacterial infections.  SYMPTOMS    There may be no symptoms. If symptoms occur, they may include:  Grey, white, yellow, or bad smelling vaginal discharge.   Pain or itching of the area outside the vagina.   Painful sexual intercourse.   Lower abdominal or lower back pain, especially during intercourse.   Frequent urination.   Abnormal vaginal bleeding between periods, after sexual intercourse, or after menopause.   Pressure or a heavy feeling in the pelvis.  DIAGNOSIS  Diagnosis is made after a pelvic exam. Other tests may include:  Examination of any discharge under a microscope (wet prep).   A Pap test.  TREATMENT  Treatment will depend on the cause of cervicitis. If it is caused by an STI, both you and your partner will need to be treated. Antibiotic medicines will be given. HOME CARE INSTRUCTIONS   Do not have sexual intercourse until your caregiver says it is okay.   Do not have sexual intercourse until your partner has been treated if your cervicitis is caused by an STI.   Take your antibiotics as directed. Finish them even if you start to feel better.  SEEK IMMEDIATE MEDICAL CARE IF:   Your symptoms come back.   You have a fever.   You experience any problems that may be related to the medicine you are taking.  MAKE SURE YOU:   Understand these instructions.   Will watch your condition.   Will get help right away if you are not doing well or get worse.  Document Released: 10/23/2005 Document Revised: 10/12/2011 Document Reviewed: 05/22/2011 [facility] Patient Information 2012 [place], [place].

## 2012-05-03 NOTE — ED Notes (Signed)
Pt presents with 3 week h/o urinary frequency.  Pt reports R flank pain x 1 week that radiates into r groin with voiding. +foul-smelling urine.  Pt reports vaginal discharge that is brown, just began birth control x 1 month ago.

## 2012-05-03 NOTE — ED Provider Notes (Signed)
History     CSN: [id number]  Arrival date & time 05/03/12  [id number]   First MD Initiated Contact with Patient 05/03/12 [phone]      Chief Complaint  Patient presents with  . Dysuria    (Consider location/radiation/quality/duration/timing/severity/associated sxs/prior treatment) Patient is a 26 y.o. female presenting with dysuria. The history is provided by the patient.  Dysuria  This is a new problem. The current episode started more than 2 days ago. The problem occurs every urination. The problem has not changed since onset.The quality of the pain is described as burning. The pain is at a severity of 3/10. The pain is mild. There has been no fever. She is sexually active. There is no history of pyelonephritis. Associated symptoms include discharge, frequency, possible pregnancy, urgency and flank pain. Pertinent negatives include no chills, no nausea and no vomiting.  Pt states dysuria for about a week. States suprapubic pain for the same time. States pain now starting in her right flank. No fever, n/v. States unsure if pregnant. Also reports starting on birth control a month ago. States also vaginal discharge that is brown. Denies prior similar discharge.   Past Medical History  Diagnosis Date  . No significant past medical history   . Vertigo     Past Surgical History  Procedure Date  . Cesarean section     No family history on file.  History  Substance Use Topics  . Smoking status: Never Smoker   . Smokeless tobacco: Not on file  . Alcohol Use: Yes     occ    OB History    Grav Para Term Preterm Abortions TAB SAB Ect Mult Living                  Review of Systems  Constitutional: Negative for fever and chills.  Respiratory: Negative.   Cardiovascular: Negative.   Gastrointestinal: Positive for abdominal pain. Negative for nausea and vomiting.  Genitourinary: Positive for dysuria, urgency, frequency, flank pain and vaginal discharge. Negative for vaginal bleeding,  vaginal pain and pelvic pain.  Musculoskeletal: Positive for back pain.  Skin: Negative.   Neurological: Negative for dizziness and weakness.    Allergies  Review of patient's allergies indicates no known allergies.  Home Medications   Current Outpatient Rx  Name Route Sig Dispense Refill  . NORGESTIM-ETH ESTRAD TRIPHASIC 0.18/0.215/0.25 MG-35 MCG PO TABS Oral Take 1 tablet by mouth daily. 1 Package 11    BP 131/64  Pulse 81  Temp 98.1 F (36.7 C) (Oral)  Resp 18  Ht 5\' 7"  (1.702 m)  Wt 286 lb (129.729 kg)  BMI 44.79 kg/m2  SpO2 99%  LMP 04/21/2012  Physical Exam  Nursing note and vitals reviewed. Constitutional: She appears well-developed and well-nourished. No distress.  Eyes: Conjunctivae are normal.  Neck: Neck supple.  Cardiovascular: Normal rate, regular rhythm and normal heart sounds.   Pulmonary/Chest: Effort normal and breath sounds normal. No respiratory distress. She has no wheezes. She has no rales.  Abdominal: Soft. Bowel sounds are normal. She exhibits no distension. There is no tenderness. There is no rebound.  Genitourinary:       Normal external genitalia. Normal vaginal canal. White vaginal discharge. No CMT. No adnexal masses or tenderness, however limited exam due to pt's body habitus.   Neurological: She is alert.  Skin: Skin is warm and dry.    ED Course  Procedures (including critical care time)   Results for orders placed during the hospital encounter  of 05/03/12  URINALYSIS, ROUTINE W REFLEX MICROSCOPIC      Component Value Range   Color, Urine YELLOW  YELLOW   APPearance CLOUDY (*) CLEAR   Specific Gravity, Urine 1.029  1.005 - 1.030   pH 6.5  5.0 - 8.0   Glucose, UA NEGATIVE  NEGATIVE mg/dL   Hgb urine dipstick NEGATIVE  NEGATIVE   Bilirubin Urine NEGATIVE  NEGATIVE   Ketones, ur NEGATIVE  NEGATIVE mg/dL   Protein, ur NEGATIVE  NEGATIVE mg/dL   Urobilinogen, UA 0.2  0.0 - 1.0 mg/dL   Nitrite NEGATIVE  NEGATIVE   Leukocytes, UA  SMALL (*) NEGATIVE  WET PREP, GENITAL      Component Value Range   Yeast Wet Prep HPF POC NONE SEEN  NONE SEEN   Trich, Wet Prep MODERATE (*) NONE SEEN   Clue Cells Wet Prep HPF POC NONE SEEN  NONE SEEN   WBC, Wet Prep HPF POC MODERATE (*) NONE SEEN  URINE MICROSCOPIC-ADD ON      Component Value Range   Squamous Epithelial / LPF MANY (*) RARE   WBC, UA 7-10  <3 WBC/hpf   RBC / HPF 0-2  <3 RBC/hpf   Bacteria, UA FEW (*) RARE   Urine-Other MUCOUS PRESENT     No results found.  Pt with urinary symptoms, vaginal discharge. Pelvic exam showed white vaginal discharge, no CMT. Results as above. Possible UTI. Cervicitis. Treated in ED with flagyl 2g PO, zithromax 1g PO, rocephin 250mg  IM. Will treat with keflex for UTI. D/c home with follow up.    1. Cervicitis   2. Trichimoniasis   3. UTI (lower urinary tract infection)       MDM          Lottie Mussel, PA 05/03/12 [phone]

## 2012-05-04 LAB — GC/CHLAMYDIA PROBE AMP, GENITAL
Chlamydia, DNA Probe: NEGATIVE
GC Probe Amp, Genital: NEGATIVE

## 2012-05-05 LAB — URINE CULTURE: Colony Count: 70000

## 2012-05-05 NOTE — ED Provider Notes (Signed)
Medical screening examination/treatment/procedure(s) were performed by non-physician practitioner and as supervising physician I was immediately available for consultation/collaboration.  Jasmine Awe, MD 05/05/12 [id number]

## 2012-05-23 NOTE — ED Notes (Signed)
Pt called this am 05/23/12 requesting refill of Rx CEPHALEXIN 500MG  TAB because she lost medication and sx has restarted.Told pt she needs to be seen again due to last visit 03/18/12

## 2012-05-24 ENCOUNTER — Encounter (HOSPITAL_COMMUNITY): Payer: Self-pay

## 2012-05-24 ENCOUNTER — Emergency Department (HOSPITAL_COMMUNITY)
Admission: EM | Admit: 2012-05-24 | Discharge: 2012-05-24 | Disposition: A | Discharge status: Home / Self Care | Payer: Medicaid Other | Attending: Emergency Medicine | Admitting: Emergency Medicine

## 2012-05-24 DIAGNOSIS — R109 Unspecified abdominal pain: Secondary | ICD-10-CM | POA: Insufficient documentation

## 2012-05-24 DIAGNOSIS — Z87891 Personal history of nicotine dependence: Secondary | ICD-10-CM | POA: Insufficient documentation

## 2012-05-24 DIAGNOSIS — R011 Cardiac murmur, unspecified: Secondary | ICD-10-CM | POA: Insufficient documentation

## 2012-05-24 DIAGNOSIS — N342 Other urethritis: Secondary | ICD-10-CM | POA: Insufficient documentation

## 2012-05-24 LAB — URINE MICROSCOPIC-ADD ON

## 2012-05-24 LAB — URINALYSIS, ROUTINE W REFLEX MICROSCOPIC
Glucose, UA: NEGATIVE mg/dL
Hgb urine dipstick: NEGATIVE
Ketones, ur: 15 mg/dL — AB
Protein, ur: NEGATIVE mg/dL

## 2012-05-24 LAB — WET PREP, GENITAL

## 2012-05-24 MED ORDER — AZITHROMYCIN 1 G PO PACK
1.0000 g | PACK | Freq: Once | ORAL | Status: AC
  Administered 2012-05-24: 1 g via ORAL
  Filled 2012-05-24: qty 1

## 2012-05-24 MED ORDER — ONDANSETRON 4 MG PO TBDP
8.0000 mg | ORAL_TABLET | Freq: Once | ORAL | Status: DC
  Filled 2012-05-24: qty 2

## 2012-05-24 MED ORDER — METRONIDAZOLE 500 MG PO TABS: 500.0000 mg | ORAL_TABLET | Freq: Two times a day (BID) | ORAL | Status: AC

## 2012-05-24 MED ORDER — ONDANSETRON HCL 8 MG PO TABS: 8.0000 mg | ORAL_TABLET | Freq: Four times a day (QID) | ORAL | Status: AC

## 2012-05-24 MED ORDER — PHENAZOPYRIDINE HCL 200 MG PO TABS: 200.0000 mg | ORAL_TABLET | Freq: Three times a day (TID) | ORAL | Status: AC

## 2012-05-24 NOTE — ED Provider Notes (Cosign Needed)
History  This chart was scribed for Ward Givens, MD by Erskine Emery. This patient was seen in room TR08C/TR08C and the patient's care was started at 15:31.   CSN: [id number]  Arrival date & time 05/24/12  1306   First MD Initiated Contact with Patient 05/24/12 1531      Chief Complaint  Patient presents with  . Urinary Tract Infection    (Consider location/radiation/quality/duration/timing/severity/associated sxs/prior treatment) HPI  Samantha Guzman is a 26 y.o. female who presents to the Emergency Department complaining of a persistent UTI with associated left flank pain for the last 2 months. Pt was prescribed antibiotics on June 28th after her last visit but did not take them correctly. Pt reports she first noticed the symptoms about 2 months ago but they have been intermittent since, gradually worsening within the last few days. Pt also reports recent episode of emesis x 3 , nausea the past 3 days, and abdominal pain during urination, with a squeezing sensation in bladder for the past couple days. Pt denies any fevers. Pt reports frequency without dysuria, she states she started having vaginal discharge yesterday.  Pt's LNMP started the 11th of June.  Pt reports she is having sex with the same sexual partner. Pt reports she found out at her last visit that she had an trichomoniasis and she was treated and he also went to be checked. Pt is G3 P1 A2.  Pt has no PCP.    Past Medical History  Diagnosis Date  . No significant past medical history   . Vertigo     Past Surgical History  Procedure Date  . Cesarean section     No family history on file.  History  Substance Use Topics  . Smoking status: Former Games developer  . Smokeless tobacco: Not on file  . Alcohol Use: Yes     occ  Pt reports she recently quit smoking. Pt is a CNA.   OB History    Grav Para Term Preterm Abortions TAB SAB Ect Mult Living                   Review of Systems  Constitutional: Negative for  fever and chills.  Respiratory: Negative for shortness of breath.   Gastrointestinal: Positive for nausea, vomiting and abdominal pain.  Genitourinary: Positive for dysuria, frequency, flank pain and vaginal discharge.  Neurological: Negative for weakness.    Allergies  Review of patient's allergies indicates no known allergies.  Home Medications   Current Outpatient Rx  Name Route Sig Dispense Refill  . CEPHALEXIN 500 MG PO CAPS Oral Take 500 mg by mouth 4 (four) times daily. Patient was on this medication and lost it.      BP 147/78  Pulse 71  Temp 98.3 F (36.8 C) (Oral)  Resp 16  SpO2 97%  LMP 04/21/2012  Vital signs normal    Physical Exam  Nursing note and vitals reviewed. Constitutional: She is oriented to person, place, and time. She appears well-developed and well-nourished. No distress.  HENT:  Head: Normocephalic and atraumatic.  Right Ear: External ear normal.  Left Ear: External ear normal.  Mouth/Throat: Oropharynx is clear and moist.  Eyes: Conjunctivae and EOM are normal. Pupils are equal, round, and reactive to light.  Neck: Normal range of motion. Neck supple. No tracheal deviation present.  Cardiovascular: Normal rate and regular rhythm.   Murmur heard.      Faint, grade 1 systolic murmur along the external border.   Pulmonary/Chest:  Effort normal and breath sounds normal. No respiratory distress.  Abdominal: Soft. Bowel sounds are normal. She exhibits no distension. There is no tenderness. There is no rebound and no guarding.  Genitourinary:       Normal external genitalia, some white discharge, thick at times. Uterus and ovaries are normal sized and nontender.   Musculoskeletal: Normal range of motion.  Neurological: She is alert and oriented to person, place, and time.  Skin: Skin is warm and dry.  Psychiatric: She has a normal mood and affect. Her behavior is normal.    ED Course  Patient does not appear to have a UTI or STD. She does not want  to have a CT AP done to r/o renal stone, states the pain isn't that bad. Also no pain in her flank on ROM to be musculoskeletal. She hasn't had fever, hematuria, but does have a lot of bladder pain with urination.   Procedures (including critical care time)  DIAGNOSTIC STUDIES: Oxygen Saturation is 97% on room air, adequate by my interpretation.    COORDINATION OF CARE:  15:56--I did a pelvic examination on the pt.   17:56--I rechecked the pt and she notified me she does not want a CAT scan.    Results for orders placed during the hospital encounter of 05/24/12  URINALYSIS, ROUTINE W REFLEX MICROSCOPIC      Component Value Range   Color, Urine YELLOW  YELLOW   APPearance CLOUDY (*) CLEAR   Specific Gravity, Urine 1.021  1.005 - 1.030   pH 6.0  5.0 - 8.0   Glucose, UA NEGATIVE  NEGATIVE mg/dL   Hgb urine dipstick NEGATIVE  NEGATIVE   Bilirubin Urine NEGATIVE  NEGATIVE   Ketones, ur 15 (*) NEGATIVE mg/dL   Protein, ur NEGATIVE  NEGATIVE mg/dL   Urobilinogen, UA 0.2  0.0 - 1.0 mg/dL   Nitrite NEGATIVE  NEGATIVE   Leukocytes, UA SMALL (*) NEGATIVE  POCT PREGNANCY, URINE      Component Value Range   Preg Test, Ur NEGATIVE  NEGATIVE  URINE MICROSCOPIC-ADD ON      Component Value Range   Squamous Epithelial / LPF MANY (*) RARE   WBC, UA 3-6  <3 WBC/hpf   RBC / HPF 0-2  <3 RBC/hpf   Bacteria, UA RARE  RARE   Urine-Other MUCOUS PRESENT    WET PREP, GENITAL      Component Value Range   Yeast Wet Prep HPF POC NONE SEEN  NONE SEEN   Trich, Wet Prep NONE SEEN  NONE SEEN   Clue Cells Wet Prep HPF POC NONE SEEN  NONE SEEN   WBC, Wet Prep HPF POC FEW (*) NONE SEEN   Laboratory interpretation all normal     1. Lt flank pain   2. Urethritis   3. Heart murmur    Patient's Medications  New Prescriptions   METRONIDAZOLE (FLAGYL) 500 MG TABLET    Take 1 tablet (500 mg total) by mouth 2 (two) times daily.   ONDANSETRON (ZOFRAN) 8 MG TABLET    Take 1 tablet (8 mg total) by mouth every  6 (six) hours.   PHENAZOPYRIDINE (PYRIDIUM) 200 MG TABLET    Take 1 tablet (200 mg total) by mouth 3 (three) times daily.   Plan discharge  Devoria Albe, MD, FACEP    MDM  I personally performed the services described in this documentation, which was scribed in my presence. The recorded information has been reviewed and considered.  Devoria Albe, MD,  Franz Dell, MD 05/24/12 Aldona Lento  Ward Givens, MD 05/24/12 [phone]

## 2012-05-24 NOTE — ED Notes (Signed)
Pt sts taht dx with uti, did not take abx appropriately, sts has back pain, and body pain, n/v ? bv infection per pt

## 2012-05-24 NOTE — ED Notes (Signed)
Pt states that she wants to be checked for STDs.  Pt states she was treated for Trich couple weeks ago. Pt told partner that they needed to be treated.  Pt states she doesn't believe pt was ever treated for Trich and she is having same vaginal discharge. Pt states she tried OTC Monistat but discharge and symptoms keep returning. Pt instructed to undress from waist down and put on gown.

## 2012-05-27 LAB — GC/CHLAMYDIA PROBE AMP, GENITAL
Chlamydia, DNA Probe: POSITIVE — AB
GC Probe Amp, Genital: NEGATIVE

## 2012-05-28 NOTE — ED Notes (Addendum)
+   Chlamydia Patient treated with Chlamydia-chart appended per protocol MD.

## 2012-05-28 NOTE — ED Notes (Signed)
Patient informed of positive results after id'd x 2 and informed of need to notify partner to be treated. 

## 2012-06-08 ENCOUNTER — Emergency Department (HOSPITAL_COMMUNITY)
Admission: EM | Admit: 2012-06-08 | Discharge: 2012-06-08 | Disposition: A | Discharge status: Home / Self Care | Payer: Medicaid Other | Attending: Emergency Medicine | Admitting: Emergency Medicine

## 2012-06-08 ENCOUNTER — Encounter (HOSPITAL_COMMUNITY): Payer: Self-pay | Admitting: *Deleted

## 2012-06-08 DIAGNOSIS — Z87891 Personal history of nicotine dependence: Secondary | ICD-10-CM | POA: Insufficient documentation

## 2012-06-08 DIAGNOSIS — N898 Other specified noninflammatory disorders of vagina: Secondary | ICD-10-CM

## 2012-06-08 LAB — URINALYSIS, ROUTINE W REFLEX MICROSCOPIC
Bilirubin Urine: NEGATIVE
Hgb urine dipstick: NEGATIVE
Nitrite: NEGATIVE
Specific Gravity, Urine: 1.028 (ref 1.005–1.030)
pH: 6 (ref 5.0–8.0)

## 2012-06-08 LAB — WET PREP, GENITAL
Clue Cells Wet Prep HPF POC: NONE SEEN
Trich, Wet Prep: NONE SEEN
Yeast Wet Prep HPF POC: NONE SEEN

## 2012-06-08 NOTE — ED Provider Notes (Signed)
History     CSN: [id number]  Arrival date & time 06/08/12  7829   First MD Initiated Contact with Patient 06/08/12 1046      Chief Complaint  Patient presents with  . Vaginal Discharge    (Consider location/radiation/quality/duration/timing/severity/associated sxs/prior treatment) Patient is a 26 y.o. female presenting with vaginal discharge. The history is provided by the patient.  Vaginal Discharge This is a new problem. The current episode started yesterday. The problem occurs constantly. The problem has been unchanged. Associated symptoms include abdominal pain. Pertinent negatives include no fever. Associated symptoms comments: She has mild lower abdominal pain associated with vaginal discharge since yesterday. No bleeding, nausea or fever. She denies dysuria. .    Past Medical History  Diagnosis Date  . No significant past medical history   . Vertigo     Past Surgical History  Procedure Date  . Cesarean section     History reviewed. No pertinent family history.  History  Substance Use Topics  . Smoking status: Former Games developer  . Smokeless tobacco: Not on file  . Alcohol Use: Yes     occ    OB History    Grav Para Term Preterm Abortions TAB SAB Ect Mult Living                  Review of Systems  Constitutional: Negative for fever.  Gastrointestinal: Positive for abdominal pain.  Genitourinary: Positive for vaginal discharge. Negative for dysuria.    Allergies  Review of patient's allergies indicates no known allergies.  Home Medications   Current Outpatient Rx  Name Route Sig Dispense Refill  . IBUPROFEN 800 MG PO TABS Oral Take 400 mg by mouth every 8 (eight) hours as needed. For pain      BP 135/67  Pulse 56  Temp 98.4 F (36.9 C) (Oral)  Resp 18  SpO2 99%  LMP 05/30/2012  Physical Exam  Constitutional: She appears well-developed and well-nourished.  HENT:  Head: Normocephalic.  Neck: Normal range of motion. Neck supple.  Cardiovascular:  Normal rate and regular rhythm.   Pulmonary/Chest: Effort normal and breath sounds normal.  Abdominal: Soft. Bowel sounds are normal. There is no tenderness. There is no rebound and no guarding.  Genitourinary: Vagina normal and uterus normal.       No adnexal mass or tenderness. Minimal white vaginal discharge. Cervix is unremarkable in appearance with motion tenderness.   Musculoskeletal: Normal range of motion.  Neurological: She is alert. No cranial nerve deficit.  Skin: Skin is warm and dry. No rash noted.  Psychiatric: She has a normal mood and affect.    ED Course  Procedures (including critical care time)   Labs Reviewed  URINALYSIS, ROUTINE W REFLEX MICROSCOPIC  WET PREP, GENITAL  PREGNANCY, URINE  GC/CHLAMYDIA PROBE AMP, GENITAL   No results found.  Results for orders placed during the hospital encounter of 06/08/12  URINALYSIS, ROUTINE W REFLEX MICROSCOPIC      Component Value Range   Color, Urine YELLOW  YELLOW   APPearance CLEAR  CLEAR   Specific Gravity, Urine 1.028  1.005 - 1.030   pH 6.0  5.0 - 8.0   Glucose, UA NEGATIVE  NEGATIVE mg/dL   Hgb urine dipstick NEGATIVE  NEGATIVE   Bilirubin Urine NEGATIVE  NEGATIVE   Ketones, ur NEGATIVE  NEGATIVE mg/dL   Protein, ur NEGATIVE  NEGATIVE mg/dL   Urobilinogen, UA 0.2  0.0 - 1.0 mg/dL   Nitrite NEGATIVE  NEGATIVE   Leukocytes,  UA NEGATIVE  NEGATIVE  WET PREP, GENITAL      Component Value Range   Yeast Wet Prep HPF POC NONE SEEN  NONE SEEN   Trich, Wet Prep NONE SEEN  NONE SEEN   Clue Cells Wet Prep HPF POC NONE SEEN  NONE SEEN   WBC, Wet Prep HPF POC NONE SEEN  NONE SEEN  PREGNANCY, URINE      Component Value Range   Preg Test, Ur NEGATIVE  NEGATIVE    No diagnosis found.  MDM  All tests negative, exam essentially unremarkable.         Rodena Medin, PA-C 06/08/12 1308

## 2012-06-08 NOTE — ED Notes (Signed)
Pt reports having vaginal discharge and itching x 4 days, thinks she has an infection. No acute distress noted at triage.

## 2012-06-09 NOTE — ED Provider Notes (Signed)
Medical screening examination/treatment/procedure(s) were performed by non-physician practitioner and as supervising physician I was immediately available for consultation/collaboration.  Finnian Husted, MD 06/09/12 0744 

## 2012-06-10 LAB — GC/CHLAMYDIA PROBE AMP, GENITAL: Chlamydia, DNA Probe: NEGATIVE

## 2012-09-12 ENCOUNTER — Emergency Department (HOSPITAL_COMMUNITY): Payer: Self-pay

## 2012-09-12 ENCOUNTER — Encounter (HOSPITAL_COMMUNITY): Payer: Self-pay | Admitting: Emergency Medicine

## 2012-09-12 ENCOUNTER — Emergency Department (HOSPITAL_COMMUNITY)
Admission: EM | Admit: 2012-09-12 | Discharge: 2012-09-12 | Disposition: A | Discharge status: Home / Self Care | Payer: Self-pay | Attending: Emergency Medicine | Admitting: Emergency Medicine

## 2012-09-12 DIAGNOSIS — R51 Headache: Secondary | ICD-10-CM | POA: Insufficient documentation

## 2012-09-12 DIAGNOSIS — R509 Fever, unspecified: Secondary | ICD-10-CM | POA: Insufficient documentation

## 2012-09-12 DIAGNOSIS — J069 Acute upper respiratory infection, unspecified: Secondary | ICD-10-CM

## 2012-09-12 DIAGNOSIS — R5381 Other malaise: Secondary | ICD-10-CM | POA: Insufficient documentation

## 2012-09-12 DIAGNOSIS — Z87891 Personal history of nicotine dependence: Secondary | ICD-10-CM | POA: Insufficient documentation

## 2012-09-12 DIAGNOSIS — R42 Dizziness and giddiness: Secondary | ICD-10-CM | POA: Insufficient documentation

## 2012-09-12 DIAGNOSIS — R0602 Shortness of breath: Secondary | ICD-10-CM | POA: Insufficient documentation

## 2012-09-12 DIAGNOSIS — R6883 Chills (without fever): Secondary | ICD-10-CM | POA: Insufficient documentation

## 2012-09-12 LAB — CBC WITH DIFFERENTIAL/PLATELET
Eosinophils Relative: 0 % (ref 0–5)
HCT: 34.6 % — ABNORMAL LOW (ref 36.0–46.0)
Hemoglobin: 11.4 g/dL — ABNORMAL LOW (ref 12.0–15.0)
Lymphocytes Relative: 16 % (ref 12–46)
Lymphs Abs: 1.9 10*3/uL (ref 0.7–4.0)
MCV: 77.8 fL — ABNORMAL LOW (ref 78.0–100.0)
Monocytes Absolute: 0.9 10*3/uL (ref 0.1–1.0)
Monocytes Relative: 8 % (ref 3–12)
Neutro Abs: 8.7 10*3/uL — ABNORMAL HIGH (ref 1.7–7.7)
RBC: 4.45 MIL/uL (ref 3.87–5.11)
WBC: 11.4 10*3/uL — ABNORMAL HIGH (ref 4.0–10.5)

## 2012-09-12 LAB — COMPREHENSIVE METABOLIC PANEL
AST: 13 U/L (ref 0–37)
CO2: 27 mEq/L (ref 19–32)
Calcium: 8.9 mg/dL (ref 8.4–10.5)
Chloride: 99 mEq/L (ref 96–112)
Creatinine, Ser: 1.09 mg/dL (ref 0.50–1.10)
GFR calc Af Amer: 82 mL/min — ABNORMAL LOW (ref >90)
GFR calc non Af Amer: 70 mL/min — ABNORMAL LOW (ref >90)
Glucose, Bld: 90 mg/dL (ref 70–99)
Total Bilirubin: 0.3 mg/dL (ref 0.3–1.2)

## 2012-09-12 LAB — URINALYSIS, ROUTINE W REFLEX MICROSCOPIC
Glucose, UA: NEGATIVE mg/dL
Hgb urine dipstick: NEGATIVE
Protein, ur: NEGATIVE mg/dL
Specific Gravity, Urine: 1.019 (ref 1.005–1.030)
pH: 7.5 (ref 5.0–8.0)

## 2012-09-12 LAB — URINE MICROSCOPIC-ADD ON

## 2012-09-12 MED ORDER — ACETAMINOPHEN 325 MG PO TABS
650.0000 mg | ORAL_TABLET | Freq: Once | ORAL | Status: AC
  Administered 2012-09-12: 650 mg via ORAL

## 2012-09-12 MED ORDER — AMOXICILLIN 500 MG PO CAPS: 1000.0000 mg | ORAL_CAPSULE | Freq: Two times a day (BID) | ORAL | Status: DC

## 2012-09-12 MED ORDER — HYDROCOD POLST-CHLORPHEN POLST 10-8 MG/5ML PO LQCR: 5.0000 mL | Freq: Two times a day (BID) | ORAL | Status: DC

## 2012-09-12 NOTE — ED Notes (Signed)
Pt c/o throat pain x couple of days, also c/o productive cough, clear mucus with yellow, also lower back pain, denies dysuria, but c/o frequency and urgency

## 2012-09-12 NOTE — ED Provider Notes (Signed)
History     CSN: [id number]  Arrival date & time 09/12/12  4098   First MD Initiated Contact with Patient 09/12/12 [phone]      Chief Complaint  Patient presents with  . Sore Throat  . Fever    (Consider location/radiation/quality/duration/timing/severity/associated sxs/prior treatment) HPI Comments: Samantha Guzman 26 y.o. female   The chief complaint is: Patient presents with:   Sore Throat   Fever    C/o sore throat, swollen tonsils, + BL rib pain and LBP. + cough +weakness and myalgias. No arthralgia. + chills and fever. + mild fatigue. No flushot.  Mucus in throat. _ sinus pressure/ pain. + odynophagia. + DOE when going up stairs. Different from previous.  No hs asthma. Denies wheezing.  Patient been taking 400 mg ibuprofen and nyquil. Friend with similar sxs.      Patient is a 26 y.o. female presenting with pharyngitis and fever.  Sore Throat Associated symptoms include chills, fatigue, a fever, headaches and neck pain. Pertinent negatives include no congestion, numbness or weakness.  Fever Primary symptoms of the febrile illness include fever, fatigue, headaches and shortness of breath. Primary symptoms do not include wheezing.  The headache is not associated with neck stiffness or weakness.     Past Medical History  Diagnosis Date  . No significant past medical history   . Vertigo     Past Surgical History  Procedure Date  . Cesarean section     History reviewed. No pertinent family history.  History  Substance Use Topics  . Smoking status: Former Games developer  . Smokeless tobacco: Not on file  . Alcohol Use: Yes     Comment: occ    OB History    Grav Para Term Preterm Abortions TAB SAB Ect Mult Living                  Review of Systems  Constitutional: Positive for fever, chills, activity change and fatigue. Negative for appetite change.  HENT: Positive for neck pain and tinnitus. Negative for ear pain, congestion, facial swelling, mouth sores,  trouble swallowing, neck stiffness, voice change and sinus pressure.   Eyes: Negative.   Respiratory: Positive for shortness of breath. Negative for wheezing.   Cardiovascular: Negative.   Gastrointestinal: Negative.   Genitourinary: Negative.   Skin: Negative.   Neurological: Positive for headaches. Negative for weakness and numbness.  All other systems reviewed and are negative.    Allergies  Review of patient's allergies indicates no known allergies.  Home Medications   Current Outpatient Rx  Name  Route  Sig  Dispense  Refill  . IBUPROFEN 800 MG PO TABS   Oral   Take 400 mg by mouth every 8 (eight) hours as needed. For pain           BP 148/80  Pulse 105  Temp 101.4 F (38.6 C) (Oral)  Resp 18  SpO2 97%  Physical Exam  Nursing note and vitals reviewed. Constitutional: She is oriented to person, place, and time. She appears well-developed and well-nourished. No distress.       Appears ill, glassy eyes  HENT:  Head: Normocephalic and atraumatic.       Large swollen tonsils with exudates.  Mild pharyngeal erythema uvula is midline.  No posterior pharynx edema or erythema noted.  She has tender tonsillar and cervical lymphadenopathy.  TMs normal bilaterally.  Eyes: Conjunctivae normal are normal. No scleral icterus.  Neck: Normal range of motion.  Cardiovascular: Normal rate, regular  rhythm and normal heart sounds.  Exam reveals no gallop and no friction rub.   No murmur heard. Pulmonary/Chest: Effort normal and breath sounds normal. No respiratory distress.  Abdominal: Soft. Bowel sounds are normal. She exhibits no distension and no mass. There is no tenderness. There is no guarding.  Neurological: She is alert and oriented to person, place, and time.  Skin: Skin is warm and dry. She is not diaphoretic.    ED Course  Procedures (including critical care time)  Labs Reviewed  URINALYSIS, ROUTINE W REFLEX MICROSCOPIC - Abnormal; Notable for the following:     Leukocytes, UA MODERATE (*)     All other components within normal limits  CBC WITH DIFFERENTIAL - Abnormal; Notable for the following:    WBC 11.4 (*)     Hemoglobin 11.4 (*)     HCT 34.6 (*)     MCV 77.8 (*)     MCH 25.6 (*)     Neutro Abs 8.7 (*)     All other components within normal limits  COMPREHENSIVE METABOLIC PANEL - Abnormal; Notable for the following:    GFR calc non Af Amer 70 (*)     GFR calc Af Amer 82 (*)     All other components within normal limits  RAPID STREP SCREEN  URINE MICROSCOPIC-ADD ON  PREGNANCY, URINE   Dg Chest 2 View  09/12/2012  *RADIOLOGY REPORT*  Clinical Data: Fever, sore throat, chest pain.  CHEST - 2 VIEW  Comparison: None.  Findings: Lungs are clear. No pleural effusion or pneumothorax. The cardiomediastinal contours are within normal limits. The visualized bones and soft tissues are without significant appreciable abnormality.  IMPRESSION: No radiographic evidence of acute cardiopulmonary process.   Original Report Authenticated By: Jearld Lesch, M.D.      1. Acute URI       MDM  URI with Fever and tender LAD.  I will d/c with amoxicillin and tussionex.   Fu at ED if sxs worsen or shourtness of breath/ difficulty swallowing. Discussed reasons to seek immediate care. Patient expresses understanding and agrees with plan.         Arthor Captain, PA-C 09/12/12 1724

## 2012-09-12 NOTE — ED Notes (Signed)
Pt c/o sore throat and fever x3 days.

## 2012-09-16 NOTE — ED Provider Notes (Signed)
Medical screening examination/treatment/procedure(s) were performed by non-physician practitioner and as supervising physician I was immediately available for consultation/collaboration.  Flint Melter, MD 09/16/12 [phone]

## 2012-10-12 ENCOUNTER — Emergency Department (HOSPITAL_COMMUNITY)
Admission: EM | Admit: 2012-10-12 | Discharge: 2012-10-12 | Disposition: A | Discharge status: Home / Self Care | Payer: Medicaid Other | Attending: Emergency Medicine | Admitting: Emergency Medicine

## 2012-10-12 ENCOUNTER — Encounter (HOSPITAL_COMMUNITY): Payer: Self-pay | Admitting: *Deleted

## 2012-10-12 DIAGNOSIS — Z8679 Personal history of other diseases of the circulatory system: Secondary | ICD-10-CM | POA: Insufficient documentation

## 2012-10-12 DIAGNOSIS — R300 Dysuria: Secondary | ICD-10-CM | POA: Insufficient documentation

## 2012-10-12 DIAGNOSIS — N390 Urinary tract infection, site not specified: Secondary | ICD-10-CM

## 2012-10-12 DIAGNOSIS — Z87891 Personal history of nicotine dependence: Secondary | ICD-10-CM | POA: Insufficient documentation

## 2012-10-12 LAB — URINE MICROSCOPIC-ADD ON

## 2012-10-12 LAB — URINALYSIS, ROUTINE W REFLEX MICROSCOPIC
Bilirubin Urine: NEGATIVE
Ketones, ur: NEGATIVE mg/dL
Nitrite: NEGATIVE
Urobilinogen, UA: 0.2 mg/dL (ref 0.0–1.0)
pH: 5.5 (ref 5.0–8.0)

## 2012-10-12 MED ORDER — SULFAMETHOXAZOLE-TRIMETHOPRIM 800-160 MG PO TABS: 1.0000 | ORAL_TABLET | Freq: Two times a day (BID) | ORAL | Status: DC

## 2012-10-12 NOTE — ED Notes (Signed)
Reports lower back pain and pain with urination x 1 month, thinks she has uti. Denies vaginal discharge or itching. No distress noted at triage.

## 2012-10-12 NOTE — ED Provider Notes (Signed)
History     CSN: [id number]  Arrival date & time 10/12/12  1157   First MD Initiated Contact with Patient 10/12/12 1236      Chief Complaint  Patient presents with  . Back Pain  . Urinary Tract Infection    (Consider location/radiation/quality/duration/timing/severity/associated sxs/prior treatment) HPI Comments: Pt c/o lower back pain:pt denies vaginal discharge:pt states that she is having lower abdominal pressure and this is similar to previous QMV:HQION pt has not had in the last year  Patient is a 26 y.o. female presenting with urinary tract infection. The history is provided by the patient. No language interpreter was used.  Urinary Tract Infection This is a new problem. The current episode started 1 to 4 weeks ago. The problem occurs constantly. The problem has been unchanged. Pertinent negatives include no fever, nausea or vomiting.    Past Medical History  Diagnosis Date  . No significant past medical history   . Vertigo     Past Surgical History  Procedure Date  . Cesarean section     History reviewed. No pertinent family history.  History  Substance Use Topics  . Smoking status: Former Games developer  . Smokeless tobacco: Not on file  . Alcohol Use: Yes     Comment: occ    OB History    Grav Para Term Preterm Abortions TAB SAB Ect Mult Living                  Review of Systems  Constitutional: Negative for fever.  Respiratory: Negative.   Cardiovascular: Negative.   Gastrointestinal: Negative for nausea and vomiting.  Genitourinary: Positive for dysuria.    Allergies  Review of patient's allergies indicates no known allergies.  Home Medications   Current Outpatient Rx  Name  Route  Sig  Dispense  Refill  . IBUPROFEN 800 MG PO TABS   Oral   Take 400 mg by mouth every 8 (eight) hours as needed. For pain         . SULFAMETHOXAZOLE-TRIMETHOPRIM 800-160 MG PO TABS   Oral   Take 1 tablet by mouth every 12 (twelve) hours.   6 tablet   0      BP 142/82  Pulse 84  Temp 97.8 F (36.6 C) (Oral)  Resp 20  SpO2 93%  LMP 10/02/2012  Physical Exam  Nursing note and vitals reviewed. Constitutional: She appears well-developed and well-nourished.  Cardiovascular: Normal rate and regular rhythm.   Pulmonary/Chest: Effort normal and breath sounds normal.  Abdominal: Soft. Bowel sounds are normal. There is no tenderness. There is no CVA tenderness.  Musculoskeletal: Normal range of motion.  Neurological: She is alert.  Skin: Skin is warm and dry.    ED Course  Procedures (including critical care time)  Labs Reviewed  URINALYSIS, ROUTINE W REFLEX MICROSCOPIC - Abnormal; Notable for the following:    APPearance TURBID (*)     Hgb urine dipstick LARGE (*)     Protein, ur 100 (*)     Leukocytes, UA LARGE (*)     All other components within normal limits  URINE MICROSCOPIC-ADD ON - Abnormal; Notable for the following:    Squamous Epithelial / LPF MANY (*)     All other components within normal limits  PREGNANCY, URINE  URINE CULTURE   No results found.   1. UTI (lower urinary tract infection)       MDM  Will treat for simple uti with bactrim:pt not pregnant:will send for culture  [name], NP 10/12/12 1358

## 2012-10-13 NOTE — ED Provider Notes (Signed)
Medical screening examination/treatment/procedure(s) were performed by non-physician practitioner and as supervising physician I was immediately available for consultation/collaboration.   Samantha Guzman M Samantha Howser, DO 10/13/12 1324 

## 2012-10-14 LAB — URINE CULTURE: Colony Count: 15000

## 2012-11-02 ENCOUNTER — Encounter (HOSPITAL_COMMUNITY): Payer: Self-pay | Admitting: Emergency Medicine

## 2012-11-02 ENCOUNTER — Emergency Department (HOSPITAL_COMMUNITY)
Admission: EM | Admit: 2012-11-02 | Discharge: 2012-11-02 | Disposition: A | Discharge status: Home / Self Care | Payer: Medicaid Other | Source: Home / Self Care

## 2012-11-02 DIAGNOSIS — J9801 Acute bronchospasm: Secondary | ICD-10-CM

## 2012-11-02 DIAGNOSIS — J069 Acute upper respiratory infection, unspecified: Secondary | ICD-10-CM

## 2012-11-02 LAB — POCT RAPID STREP A: Streptococcus, Group A Screen (Direct): NEGATIVE

## 2012-11-02 MED ORDER — IPRATROPIUM BROMIDE 0.02 % IN SOLN
0.5000 mg | Freq: Once | RESPIRATORY_TRACT | Status: AC
  Administered 2012-11-02: 0.5 mg via RESPIRATORY_TRACT

## 2012-11-02 MED ORDER — METHYLPREDNISOLONE 4 MG PO KIT: PACK | ORAL | Status: DC

## 2012-11-02 MED ORDER — ALBUTEROL SULFATE (5 MG/ML) 0.5% IN NEBU
INHALATION_SOLUTION | RESPIRATORY_TRACT | Status: AC
  Filled 2012-11-02: qty 0.5

## 2012-11-02 MED ORDER — ALBUTEROL SULFATE (5 MG/ML) 0.5% IN NEBU
2.5000 mg | INHALATION_SOLUTION | Freq: Once | RESPIRATORY_TRACT | Status: AC
  Administered 2012-11-02: 2.5 mg via RESPIRATORY_TRACT

## 2012-11-02 MED ORDER — ALBUTEROL SULFATE HFA 108 (90 BASE) MCG/ACT IN AERS: 1.0000 | INHALATION_SPRAY | Freq: Four times a day (QID) | RESPIRATORY_TRACT | Status: DC | PRN

## 2012-11-02 NOTE — ED Provider Notes (Signed)
History     CSN: [id number]  Arrival date & time 11/02/12  1145   None     Chief Complaint  Patient presents with  . URI    (Consider location/radiation/quality/duration/timing/severity/associated sxs/prior treatment) HPI Comments: 26 year old female presents with cough for one week. Is associated with bodyaches and headache. She also is nasal congestion and shortness of breath. She has been taking Tussionex but this has not helped her cough. The cough is worse when lying down. Chest and ear discomfort and congestion associated with nasal congestion, runny nose and perception of a fever earlier this week but not recently.  Patient is a 26 y.o. female presenting with URI. The history is provided by the patient.  URI The primary symptoms include fever, headaches, sore throat, cough, vomiting and myalgias. Primary symptoms do not include fatigue, swollen glands or rash. The current episode started 6 to 7 days ago.  The headache is not associated with neck stiffness.  Symptoms associated with the illness include congestion and rhinorrhea. The illness is not associated with chills.    Past Medical History  Diagnosis Date  . No significant past medical history   . Vertigo     Past Surgical History  Procedure Date  . Cesarean section     No family history on file.  History  Substance Use Topics  . Smoking status: Former Games developer  . Smokeless tobacco: Not on file  . Alcohol Use: Yes     Comment: occ    OB History    Grav Para Term Preterm Abortions TAB SAB Ect Mult Living                  Review of Systems  Constitutional: Positive for fever. Negative for chills, activity change, appetite change and fatigue.  HENT: Positive for congestion, sore throat, rhinorrhea and postnasal drip. Negative for facial swelling, neck pain and neck stiffness.   Eyes: Negative.   Respiratory: Positive for cough.   Cardiovascular: Negative.   Gastrointestinal: Positive for vomiting.    Vomiting once today.  Genitourinary: Negative.   Musculoskeletal: Positive for myalgias.  Skin: Negative for pallor and rash.  Neurological: Positive for headaches.    Allergies  Review of patient's allergies indicates no known allergies.  Home Medications   Current Outpatient Rx  Name  Route  Sig  Dispense  Refill  . ALBUTEROL SULFATE HFA 108 (90 BASE) MCG/ACT IN AERS   Inhalation   Inhale 1-2 puffs into the lungs every 6 (six) hours as needed for wheezing.   1 Inhaler   0   . IBUPROFEN 800 MG PO TABS   Oral   Take 400 mg by mouth every 8 (eight) hours as needed. For pain         . METHYLPREDNISOLONE 4 MG PO KIT      follow package directions   21 tablet   0   . SULFAMETHOXAZOLE-TRIMETHOPRIM 800-160 MG PO TABS   Oral   Take 1 tablet by mouth every 12 (twelve) hours.   6 tablet   0     BP 115/69  Pulse 102  Temp 98.4 F (36.9 C) (Oral)  Resp 19  SpO2 99%  LMP 11/02/2012  Physical Exam  Nursing note and vitals reviewed. Constitutional: She is oriented to person, place, and time. She appears well-developed and well-nourished. No distress.  HENT:       Bilateral TMs are normal Oropharynx with erythema mildly enlarged tonsils with postpharyngeal streaking and exudates.  Eyes:  EOM are normal.  Neck: Normal range of motion. Neck supple.  Cardiovascular: Normal rate, regular rhythm and normal heart sounds.   Pulmonary/Chest: Effort normal. No respiratory distress. She has wheezes. She has no rales.       She is unable to take a deep breath due to coughing. Expiratory wheezes throughout. No crackles.  Abdominal: Soft. There is no tenderness.  Musculoskeletal: Normal range of motion. She exhibits no edema.  Lymphadenopathy:    She has no cervical adenopathy.  Neurological: She is alert and oriented to person, place, and time. She exhibits normal muscle tone.  Skin: Skin is warm and dry. No rash noted.  Psychiatric: She has a normal mood and affect.    ED  Course  Procedures (including critical care time)   Labs Reviewed  POCT RAPID STREP A ([facility] ONLY)   No results found.   1. URI (upper respiratory infection)   2. Bronchospasm       MDM  Duoneb. Instead she states she is feeling better. She is able to take a deep breath without coughing now. Her wheezes have greatly improved and are much diminished. Abdomen has improved and expiratory phase is normal.  Medrol dose pack Albuterol HFA 2 puffs every 4 hours when necessary cough and wheeze Drink plenty of fluids stay well hydrated May continue taking the Tussionex every 12 hours when necessary upper respiratory symptoms. Do not overuse the Tussionex for the cough as this is primarily due to bronchospasm and the albuterol HFA should be the treatment of choice for the cough. For any worsening, new symptoms problems such as shortness of breath, fever, worsening cough or not getting any better he should recheck be rechecked. May return if necessary.        Hayden Rasmussen, NP 11/02/12 [phone]

## 2012-11-02 NOTE — ED Notes (Signed)
Pt c/o cold sx x1 week... Started to vomit and feel dizzy today... Sx include: fevers, nauseas, cough w/green sputum, body aches, headaches... Denies: diarrhea... She is alert w/no signs of acute distress

## 2012-11-04 NOTE — ED Provider Notes (Signed)
Medical screening examination/treatment/procedure(s) were performed by resident physician or non-physician practitioner and as supervising physician I was immediately available for consultation/collaboration.   Evadne Ose DOUGLAS MD.    Malaquias Lenker D Knowledge Escandon, MD 11/04/12 2056 

## 2013-01-16 ENCOUNTER — Inpatient Hospital Stay (HOSPITAL_COMMUNITY)
Admission: AD | Admit: 2013-01-16 | Discharge: 2013-01-16 | Disposition: A | Discharge status: Home / Self Care | Payer: Medicaid Other | Source: Ambulatory Visit | Attending: Obstetrics and Gynecology | Admitting: Obstetrics and Gynecology

## 2013-01-16 ENCOUNTER — Encounter (HOSPITAL_COMMUNITY): Payer: Self-pay | Admitting: *Deleted

## 2013-01-16 DIAGNOSIS — R109 Unspecified abdominal pain: Secondary | ICD-10-CM | POA: Insufficient documentation

## 2013-01-16 DIAGNOSIS — N390 Urinary tract infection, site not specified: Secondary | ICD-10-CM | POA: Insufficient documentation

## 2013-01-16 DIAGNOSIS — R300 Dysuria: Secondary | ICD-10-CM | POA: Insufficient documentation

## 2013-01-16 LAB — URINALYSIS, ROUTINE W REFLEX MICROSCOPIC
Nitrite: POSITIVE — AB
Protein, ur: >300 mg/dL — AB
Urobilinogen, UA: 0.2 mg/dL (ref 0.0–1.0)

## 2013-01-16 LAB — URINE MICROSCOPIC-ADD ON

## 2013-01-16 MED ORDER — CIPROFLOXACIN HCL 500 MG PO TABS: 500.0000 mg | ORAL_TABLET | Freq: Two times a day (BID) | ORAL | Status: DC

## 2013-01-16 MED ORDER — PHENAZOPYRIDINE HCL 200 MG PO TABS: 200.0000 mg | ORAL_TABLET | Freq: Three times a day (TID) | ORAL | Status: DC | PRN

## 2013-01-16 NOTE — MAU Provider Note (Signed)
History     CSN: [id number]  Arrival date and time: 01/16/13 4098   First Provider Initiated Contact with Patient 01/16/13 0242      Chief Complaint  Patient presents with  . Vaginal Bleeding  . Dysuria   HPI [name] is a 27 y.o. [phone] who presents to MAU today with complaint of dysuria and leaking of urine. The patient states that she had intercourse yesterday and felt a pop. She states that there was no pain at the time, but she thought he may have "gone through her cervix." Today she is having lower abdominal pressure and leaking urine. She is also having bleeding although unsure if this is vaginal bleeding or hematuria. LMP was 12/30/12. The patient states that this is not a new partner and they did not use protection. She denies vaginal discharge or fever. She has had some nausea without vomiting. 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   OB History   Grav Para Term Preterm Abortions TAB SAB Ect Mult Living   3 1  1 2  2   1       Past Medical History  Diagnosis Date  . No significant past medical history   . Vertigo     Past Surgical History  Procedure Laterality Date  . Cesarean section      History reviewed. No pertinent family history.  History  Substance Use Topics  . Smoking status: Former Games developer  . Smokeless tobacco: Not on file  . Alcohol Use: Yes     Comment: occ    Allergies: No Known Allergies  Prescriptions prior to admission  Medication Sig Dispense Refill  . albuterol (PROVENTIL HFA;VENTOLIN HFA) 108 (90 BASE) MCG/ACT inhaler Inhale 1-2 puffs into the lungs every 6 (six) hours as needed for wheezing.  1 Inhaler  0  . ibuprofen (ADVIL,MOTRIN) 800 MG tablet Take 400 mg by mouth every 8 (eight) hours as needed. For pain      . [DISCONTINUED] methylPREDNISolone (MEDROL DOSEPAK) 4 MG tablet follow package directions  21 tablet  0  . [DISCONTINUED] sulfamethoxazole-trimethoprim (SEPTRA DS) 800-160 MG per  tablet Take 1 tablet by mouth every 12 (twelve) hours.  6 tablet  0    Review of Systems  Constitutional: Negative for fever, chills and malaise/fatigue.  Gastrointestinal: Positive for nausea, abdominal pain and constipation. Negative for vomiting and diarrhea.  Genitourinary: Positive for urgency and hematuria. Negative for dysuria and frequency.       + bladder tightening + leaking urine  Musculoskeletal: Negative for back pain.   Physical Exam   Blood pressure 124/80, pulse 69, temperature 97.4 F (36.3 C), temperature source Oral, resp. rate 20, height 5\' 7"  (1.702 m), weight 268 lb (121.564 kg), last menstrual period 12/30/2012, SpO2 100.00%.  Physical Exam  Constitutional: She is oriented to person, place, and time. She appears well-developed and well-nourished. No distress.  HENT:  Head: Normocephalic and atraumatic.  Cardiovascular: Normal rate, regular rhythm and normal heart sounds.   Respiratory: Effort normal and breath sounds normal. No respiratory distress.  GI: Soft. Bowel sounds are normal. She exhibits no distension and no mass. There is no tenderness. There is no rebound, no guarding and no CVA tenderness.  Genitourinary: Vagina normal. Uterus is not enlarged and not tender. Cervix exhibits discharge (small amount of thin white discharge noted in the vagina). Cervix exhibits no motion tenderness and no friability. Right adnexum displays no mass and no tenderness. Left  adnexum displays no mass and no tenderness.  No evidence of trauma. No bleeding. No loss of urine with valsalva.   Neurological: She is alert and oriented to person, place, and time.  Skin: Skin is warm and dry. No erythema.  Psychiatric: She has a normal mood and affect.   Results for orders placed during the hospital encounter of 01/16/13 (from the past 24 hour(s))  URINALYSIS, ROUTINE W REFLEX MICROSCOPIC     Status: Abnormal   Collection Time    01/16/13  2:03 AM      Result Value Range   Color,  Urine YELLOW  YELLOW   APPearance CLOUDY (*) CLEAR   Specific Gravity, Urine >1.030 (*) 1.005 - 1.030   pH 6.0  5.0 - 8.0   Glucose, UA NEGATIVE  NEGATIVE mg/dL   Hgb urine dipstick LARGE (*) NEGATIVE   Bilirubin Urine SMALL (*) NEGATIVE   Ketones, ur NEGATIVE  NEGATIVE mg/dL   Protein, ur >308 (*) NEGATIVE mg/dL   Urobilinogen, UA 0.2  0.0 - 1.0 mg/dL   Nitrite POSITIVE (*) NEGATIVE   Leukocytes, UA SMALL (*) NEGATIVE  URINE MICROSCOPIC-ADD ON     Status: None   Collection Time    01/16/13  2:03 AM      Result Value Range   Squamous Epithelial / LPF RARE  RARE   WBC, UA 0-2  <3 WBC/hpf   RBC / HPF 21-50  <3 RBC/hpf   Bacteria, UA RARE  RARE  POCT PREGNANCY, URINE     Status: None   Collection Time    01/16/13  2:39 AM      Result Value Range   Preg Test, Ur NEGATIVE  NEGATIVE    MAU Course  Procedures None  MDM Patient is afebrile and without CVA tenderness today. Leaking of urine is most likely from urinary urgency as a symptom of current UTI.   Assessment and Plan  A: UTI  P: Discharge home Rx for Cipro and Pyridium given to the patient Encouraged increased PO hydration Contact information for [facility] given to patient. Encouraged patient to establish care with PCP. Possible urology referral may be necessary if symptoms do not improve.  Patient may return to MAU as needed or if her condition were to change or worsen  Freddi Starr, PA-C  01/16/2013, 4:18 AM

## 2013-01-16 NOTE — MAU Note (Signed)
Pt states"i think i messed something up in my bladder", states during sex yesterday she states she felt a "pop". States it was not painful at the time but she has had lower abd pain all day today. States she is having vaginal bleeding and is leaking urine today.

## 2013-01-21 NOTE — MAU Provider Note (Signed)
Attestation of Attending Supervision of Advanced Practitioner: Evaluation and management procedures were performed by the PA/NP/CNM/OB Fellow under my supervision/collaboration. Chart reviewed and agree with management and plan.  FERGUSON,JOHN V 01/21/2013 2:57 PM

## 2013-06-15 ENCOUNTER — Encounter (HOSPITAL_COMMUNITY): Payer: Self-pay

## 2013-06-15 ENCOUNTER — Inpatient Hospital Stay (HOSPITAL_COMMUNITY)
Admission: AD | Admit: 2013-06-15 | Discharge: 2013-06-16 | Disposition: A | Discharge status: Home / Self Care | Payer: Medicaid Other | Source: Ambulatory Visit | Attending: Obstetrics and Gynecology | Admitting: Obstetrics and Gynecology

## 2013-06-15 DIAGNOSIS — O210 Mild hyperemesis gravidarum: Secondary | ICD-10-CM | POA: Insufficient documentation

## 2013-06-15 DIAGNOSIS — O219 Vomiting of pregnancy, unspecified: Secondary | ICD-10-CM

## 2013-06-15 LAB — POCT PREGNANCY, URINE: Preg Test, Ur: POSITIVE — AB

## 2013-06-15 NOTE — MAU Note (Signed)
Pt reports vomiting x 5 today, LMP 04/30/2013. +UPT at [facility].

## 2013-06-16 ENCOUNTER — Encounter (HOSPITAL_COMMUNITY): Payer: Self-pay

## 2013-06-16 DIAGNOSIS — O210 Mild hyperemesis gravidarum: Secondary | ICD-10-CM

## 2013-06-16 LAB — URINALYSIS, ROUTINE W REFLEX MICROSCOPIC
Glucose, UA: NEGATIVE mg/dL
Nitrite: NEGATIVE
Specific Gravity, Urine: 1.02 (ref 1.005–1.030)
pH: 6.5 (ref 5.0–8.0)

## 2013-06-16 LAB — URINE MICROSCOPIC-ADD ON

## 2013-06-16 MED ORDER — ONDANSETRON HCL 4 MG PO TABS: 4.0000 mg | ORAL_TABLET | Freq: Three times a day (TID) | ORAL | Status: DC | PRN

## 2013-06-16 MED ORDER — PROMETHAZINE HCL 12.5 MG PO TABS: 12.5000 mg | ORAL_TABLET | Freq: Four times a day (QID) | ORAL | Status: DC | PRN

## 2013-06-16 MED ORDER — PROMETHAZINE HCL 25 MG/ML IJ SOLN
25.0000 mg | Freq: Once | INTRAMUSCULAR | Status: AC
  Administered 2013-06-16: 25 mg via INTRAMUSCULAR
  Filled 2013-06-16: qty 1

## 2013-06-16 NOTE — MAU Provider Note (Signed)
History     CSN: [id number]  Arrival date and time: 06/15/13 2306   First Provider Initiated Contact with Patient 06/15/13 2356      Chief Complaint  Patient presents with  . Emesis During Pregnancy   HPI  Samantha Guzman is a 27 y.o. [id number] at Unknown EGA who presents today with nausea and vomiting. She states that for the last week she has been vomiting about 5x per day. She denies any pain or bleeding. She states that she had hyperemesis with her first pregnancy and had reglan pump. That was while she was living in [place]. She has an appointment on the 25th with [facility].   Past Medical History  Diagnosis Date  . No significant past medical history   . Vertigo     Past Surgical History  Procedure Laterality Date  . Cesarean section      History reviewed. No pertinent family history.  History  Substance Use Topics  . Smoking status: Former Games developer  . Smokeless tobacco: Not on file  . Alcohol Use: Yes     Comment: occ    Allergies: No Known Allergies  Prescriptions prior to admission  Medication Sig Dispense Refill  . albuterol (PROVENTIL HFA;VENTOLIN HFA) 108 (90 BASE) MCG/ACT inhaler Inhale 1-2 puffs into the lungs every 6 (six) hours as needed for wheezing.  1 Inhaler  0  . ciprofloxacin (CIPRO) 500 MG tablet Take 1 tablet (500 mg total) by mouth every 12 (twelve) hours.  14 tablet  0  . ibuprofen (ADVIL,MOTRIN) 800 MG tablet Take 400 mg by mouth every 8 (eight) hours as needed. For pain      . phenazopyridine (PYRIDIUM) 200 MG tablet Take 1 tablet (200 mg total) by mouth 3 (three) times daily as needed for pain.  10 tablet  0    ROS Physical Exam   Blood pressure 129/93, pulse 101, temperature 98.5 F (36.9 C), temperature source Oral, resp. rate 20, height 5\' 7"  (1.702 m), weight 113.671 kg (250 lb 9.6 oz), last menstrual period 04/30/2013.  Physical Exam  Nursing note and vitals reviewed. Constitutional: She is oriented to person, place, and time. She  appears well-developed and well-nourished. No distress.  Cardiovascular: Normal rate.   Respiratory: Effort normal.  GI: Soft. There is no tenderness.  Neurological: She is alert and oriented to person, place, and time.  Skin: Skin is warm and dry.  Psychiatric: She has a normal mood and affect.    MAU Course  Procedures  Results for orders placed during the hospital encounter of 06/15/13 (from the past 24 hour(s))  URINALYSIS, ROUTINE W REFLEX MICROSCOPIC     Status: Abnormal   Collection Time    06/15/13 11:25 PM      Result Value Range   Color, Urine YELLOW  YELLOW   APPearance CLEAR  CLEAR   Specific Gravity, Urine 1.020  1.005 - 1.030   pH 6.5  5.0 - 8.0   Glucose, UA NEGATIVE  NEGATIVE mg/dL   Hgb urine dipstick NEGATIVE  NEGATIVE   Bilirubin Urine NEGATIVE  NEGATIVE   Ketones, ur NEGATIVE  NEGATIVE mg/dL   Protein, ur NEGATIVE  NEGATIVE mg/dL   Urobilinogen, UA 0.2  0.0 - 1.0 mg/dL   Nitrite NEGATIVE  NEGATIVE   Leukocytes, UA SMALL (*) NEGATIVE  URINE MICROSCOPIC-ADD ON     Status: Abnormal   Collection Time    06/15/13 11:25 PM      Result Value Range   Squamous Epithelial /  LPF FEW (*) RARE   WBC, UA 7-10  <3 WBC/hpf   RBC / HPF 3-6  <3 RBC/hpf   Bacteria, UA FEW (*) RARE   Urine-Other RARE YEAST    POCT PREGNANCY, URINE     Status: Abnormal   Collection Time    06/15/13 11:29 PM      Result Value Range   Preg Test, Ur POSITIVE (*) NEGATIVE   0155: Patient has been able to keep down gingerale, and states that she is feeling better.   Assessment and Plan   1. Nausea and vomiting in pregnancy prior to [redacted] weeks gestation    RX: phenergan 12.5 #30 with 1 RF Zofran 4mg  #30 with 1 rf Start PNC at [facility] as planned Return to MAU as needed  Tawnya Crook 06/16/2013, 12:01 AM

## 2013-06-18 LAB — URINE CULTURE

## 2013-06-20 NOTE — MAU Provider Note (Signed)
Attestation of Attending Supervision of Advanced Practitioner: Evaluation and management procedures were performed by the PA/NP/CNM/OB Fellow under my supervision/collaboration. Chart reviewed and agree with management and plan.  Bently Morath V 06/20/2013 5:33 AM

## 2013-06-27 ENCOUNTER — Encounter (HOSPITAL_COMMUNITY): Payer: Self-pay | Admitting: *Deleted

## 2013-06-27 ENCOUNTER — Emergency Department (HOSPITAL_COMMUNITY)
Admission: EM | Admit: 2013-06-27 | Discharge: 2013-06-27 | Disposition: A | Discharge status: Home / Self Care | Payer: Medicaid Other | Attending: Emergency Medicine | Admitting: Emergency Medicine

## 2013-06-27 ENCOUNTER — Emergency Department (HOSPITAL_COMMUNITY): Payer: Medicaid Other

## 2013-06-27 DIAGNOSIS — K5900 Constipation, unspecified: Secondary | ICD-10-CM | POA: Insufficient documentation

## 2013-06-27 DIAGNOSIS — O9989 Other specified diseases and conditions complicating pregnancy, childbirth and the puerperium: Secondary | ICD-10-CM | POA: Insufficient documentation

## 2013-06-27 DIAGNOSIS — O219 Vomiting of pregnancy, unspecified: Secondary | ICD-10-CM | POA: Insufficient documentation

## 2013-06-27 DIAGNOSIS — Z87891 Personal history of nicotine dependence: Secondary | ICD-10-CM | POA: Insufficient documentation

## 2013-06-27 DIAGNOSIS — R110 Nausea: Secondary | ICD-10-CM | POA: Insufficient documentation

## 2013-06-27 LAB — POCT I-STAT, CHEM 8
BUN: 5 mg/dL — ABNORMAL LOW (ref 6–23)
Calcium, Ion: 1.1 mmol/L — ABNORMAL LOW (ref 1.12–1.23)
Chloride: 102 mEq/L (ref 96–112)
Creatinine, Ser: 0.8 mg/dL (ref 0.50–1.10)
Glucose, Bld: 87 mg/dL (ref 70–99)
HCT: 37 % (ref 36.0–46.0)
Hemoglobin: 12.6 g/dL (ref 12.0–15.0)
Potassium: 3.2 mEq/L — ABNORMAL LOW (ref 3.5–5.1)
Sodium: 138 mEq/L (ref 135–145)
TCO2: 23 mmol/L (ref 0–100)

## 2013-06-27 LAB — URINALYSIS, ROUTINE W REFLEX MICROSCOPIC
Bilirubin Urine: NEGATIVE
Glucose, UA: NEGATIVE mg/dL
Hgb urine dipstick: NEGATIVE
Ketones, ur: >80 mg/dL — AB
Nitrite: NEGATIVE
Protein, ur: NEGATIVE mg/dL
Specific Gravity, Urine: 1.027 (ref 1.005–1.030)
Urobilinogen, UA: 0.2 mg/dL (ref 0.0–1.0)
pH: 6 (ref 5.0–8.0)

## 2013-06-27 LAB — URINE MICROSCOPIC-ADD ON

## 2013-06-27 LAB — POCT PREGNANCY, URINE: Preg Test, Ur: POSITIVE — AB

## 2013-06-27 MED ORDER — ONDANSETRON HCL 4 MG PO TABS: 4.0000 mg | ORAL_TABLET | Freq: Four times a day (QID) | ORAL | Status: DC | PRN

## 2013-06-27 MED ORDER — SODIUM CHLORIDE 0.9 % IV BOLUS (SEPSIS)
1000.0000 mL | Freq: Once | INTRAVENOUS | Status: AC
  Administered 2013-06-27: 1000 mL via INTRAVENOUS

## 2013-06-27 MED ORDER — ONDANSETRON HCL 4 MG/2ML IJ SOLN
4.0000 mg | INTRAMUSCULAR | Status: AC
  Administered 2013-06-27: 4 mg via INTRAVENOUS
  Filled 2013-06-27: qty 2

## 2013-06-27 MED ORDER — POTASSIUM CHLORIDE CRYS ER 20 MEQ PO TBCR
40.0000 meq | EXTENDED_RELEASE_TABLET | Freq: Once | ORAL | Status: AC
  Administered 2013-06-27: 10 meq via ORAL
  Filled 2013-06-27: qty 2

## 2013-06-27 MED ORDER — POTASSIUM CHLORIDE 20 MEQ/15ML (10%) PO LIQD
40.0000 meq | Freq: Once | ORAL | Status: AC
  Administered 2013-06-27: 40 meq via ORAL
  Filled 2013-06-27: qty 30

## 2013-06-27 NOTE — ED Notes (Addendum)
Pt laying in bed, appears uncomfortable sts feeling nauseated and vomiting x3 weeks with no relief. Sts is approximately [redacted] weeks pregnant has had similar episode with previous pregnancy sts was admitted due to pancreatitis and fatty liver. Pt sts has taken PO phenegran and zofran without relief. sts previous admission reglan gave pt moderate relief.

## 2013-06-27 NOTE — ED Notes (Signed)
Pt st's unable to void at this time 

## 2013-06-27 NOTE — ED Provider Notes (Signed)
CSN: [id number]     Arrival date & time 06/27/13  1653 History     First MD Initiated Contact with Patient 06/27/13 1706     Chief Complaint  Patient presents with  . Emesis During Pregnancy   (Consider location/radiation/quality/duration/timing/severity/associated sxs/prior Treatment) HPI Comments: Patient is a 27 year old [id number] female with recent positive pregnancy who presents for nausea and vomiting x 1 week. Patient states that symptoms have been persistent since onset without any modifying factors. She states she has tried phenergan for symptoms without relief. She endorses nausea/emesis with prior pregnancies. Patient denies hematemesis, though she states she did have one episode of emesis with a small amount of streaked blood. She admits to associated constipation and states it has been a few days since her last BM. She further denies fever, CP, SOB, abdominal pain, vaginal bleeding, vaginal discharge, urinary symptoms, numbness/tingling, and weakness.  LMP 04/30/2013. Patient has not been seen by OBGYN for prenatal care yet; however, Epic indicates she was seen by Dr. Sherryl Barters on 06/15/13. She endorses scheduled appt in 3 days.  The history is provided by the patient. No language interpreter was used.    Past Medical History  Diagnosis Date  . No significant past medical history   . Vertigo    Past Surgical History  Procedure Laterality Date  . Cesarean section     History reviewed. No pertinent family history. History  Substance Use Topics  . Smoking status: Former Games developer  . Smokeless tobacco: Not on file  . Alcohol Use: Yes     Comment: occ   OB History   Grav Para Term Preterm Abortions TAB SAB Ect Mult Living   4 1  1 2  2   1      Review of Systems  Constitutional: Negative for fever.  Gastrointestinal: Positive for nausea, vomiting and constipation. Negative for abdominal pain and blood in stool.  Genitourinary: Negative for dysuria, hematuria, flank pain,  vaginal bleeding and vaginal discharge.  Neurological: Negative for syncope, weakness and numbness.  All other systems reviewed and are negative.   Allergies  Review of patient's allergies indicates no known allergies.  Home Medications   Current Outpatient Rx  Name  Route  Sig  Dispense  Refill  . ibuprofen (ADVIL,MOTRIN) 800 MG tablet   Oral   Take 400 mg by mouth every 8 (eight) hours as needed. For pain         . promethazine (PHENERGAN) 12.5 MG tablet   Oral   Take 1 tablet (12.5 mg total) by mouth every 6 (six) hours as needed for nausea.   30 tablet   1   . ondansetron (ZOFRAN) 4 MG tablet   Oral   Take 1 tablet (4 mg total) by mouth every 6 (six) hours as needed for nausea.   20 tablet   0    BP 124/55  Pulse 63  Temp(Src) 98.6 F (37 C) (Oral)  Resp 18  SpO2 100%  LMP 04/30/2013  Physical Exam  Nursing note and vitals reviewed. Constitutional: She is oriented to person, place, and time. She appears well-developed and well-nourished. No distress.  HENT:  Head: Normocephalic and atraumatic.  Mouth/Throat: Oropharynx is clear and moist. No oropharyngeal exudate.  Eyes: Conjunctivae and EOM are normal. Pupils are equal, round, and reactive to light. No scleral icterus.  Neck: Normal range of motion.  Cardiovascular: Normal rate, regular rhythm and normal heart sounds.   Patient not tachycardic as noted in triage  Pulmonary/Chest: Effort  normal and breath sounds normal. No respiratory distress. She has no wheezes. She has no rales.  Abdominal: Soft. She exhibits no distension. There is no tenderness. There is no rebound.  Soft, obese abdomen. No tenderness to palpation, peritoneal signs, or evidence of acute surgical abdomen  Musculoskeletal: Normal range of motion.  Neurological: She is alert and oriented to person, place, and time.  Skin: Skin is warm and dry. No rash noted. She is not diaphoretic. No erythema. No pallor.  Psychiatric: She has a normal  mood and affect. Her behavior is normal.    ED Course   Procedures (including critical care time)  Labs Reviewed  URINALYSIS, ROUTINE W REFLEX MICROSCOPIC - Abnormal; Notable for the following:    Ketones, ur >80 (*)    Leukocytes, UA SMALL (*)    All other components within normal limits  URINE MICROSCOPIC-ADD ON - Abnormal; Notable for the following:    Squamous Epithelial / LPF MANY (*)    Bacteria, UA MANY (*)    All other components within normal limits  POCT I-STAT, CHEM 8 - Abnormal; Notable for the following:    Potassium 3.2 (*)    BUN 5 (*)    Calcium, Ion 1.10 (*)    All other components within normal limits  POCT PREGNANCY, URINE - Abnormal; Notable for the following:    Preg Test, Ur POSITIVE (*)    All other components within normal limits  URINE CULTURE   US Ob Comp Less 14 Wks  06/27/2013   *RADIOLOGY REPORT*  Clinical Data:  Pregnancy, emesis  OBSTETRIC <14 WK [place] AND TRANSVAGINAL OB US  Technique:  Both transabdominal and transvaginal ultrasound examinations were performed for complete evaluation of the gestation as well as the maternal uterus, adnexal regions, and pelvic cul-de-sac.  Transvaginal technique was performed to assess early pregnancy.  Comparison:  None for this gestation  Intrauterine gestational sac:  Visualized/normal in shape. Yolk sac: Present Embryo: Present Cardiac Activity: Present Heart Rate: 182 bpm  CRL: 17.9  mm  8 w  2 d             [place] EDC: 02/04/2014  Maternal uterus/adnexae: No subchorionic hemorrhage. Left ovary not visualized, question obscured by bowel gas. Right ovary normal size and morphology 2.1 x 4.0 x 2.4 cm. Trace free pelvic fluid. No adnexal masses.  IMPRESSION: Single live early intrauterine gestation measured at 8 weeks 2 days EGA by crown-rump length. No acute abnormalities.   Original Report Authenticated By: Ulyses Southward, M.D.   US Ob Transvaginal  06/27/2013   *RADIOLOGY REPORT*  Clinical Data:  Pregnancy, emesis  OBSTETRIC  <14 WK [place] AND TRANSVAGINAL OB US  Technique:  Both transabdominal and transvaginal ultrasound examinations were performed for complete evaluation of the gestation as well as the maternal uterus, adnexal regions, and pelvic cul-de-sac.  Transvaginal technique was performed to assess early pregnancy.  Comparison:  None for this gestation  Intrauterine gestational sac:  Visualized/normal in shape. Yolk sac: Present Embryo: Present Cardiac Activity: Present Heart Rate: 182 bpm  CRL: 17.9  mm  8 w  2 d             [place] EDC: 02/04/2014  Maternal uterus/adnexae: No subchorionic hemorrhage. Left ovary not visualized, question obscured by bowel gas. Right ovary normal size and morphology 2.1 x 4.0 x 2.4 cm. Trace free pelvic fluid. No adnexal masses.  IMPRESSION: Single live early intrauterine gestation measured at 8 weeks 2 days EGA by crown-rump  length. No acute abnormalities.   Original Report Authenticated By: Ulyses Southward, M.D.   1. Nausea and vomiting in pregnancy    MDM  27 year old female presents for nausea and vomiting times one week. Patient currently pregnant. She is afebrile and hemodynamically stable. Lab significant for hypokalemia for which patient was given PO potassium replacement. UA findings consistent with contamination and dehydration. Abdominal ultrasound obtained as patient was tachycardic in triage. Ultrasound findings significant for a single live intrauterine gestation approximately 8 weeks 2 days. Patient has been well hydrated with 2 L IVF. Nausea well controlled in ED with IV Zofran. Patient tolerating fluids without emesis. She is stable and appropriate for discharge with OB/GYN followup; endorses scheduled appointment with obstetrician on Monday. Indications for ED return provided.    Antony Madura, PA-C 06/27/13 2221

## 2013-06-27 NOTE — ED Provider Notes (Signed)
Medical screening examination/treatment/procedure(s) were performed by non-physician practitioner and as supervising physician I was immediately available for consultation/collaboration.  Lyanne Co, MD 06/27/13 2225

## 2013-06-27 NOTE — ED Notes (Signed)
Pt drinking water- able to keep fluids down. Requesting more anti-emetics

## 2013-06-27 NOTE — ED Notes (Signed)
Pt reports that she is pregnant and vomiting.  States taht she is unsure of how many weeks she is.  LMP June 25.

## 2013-06-27 NOTE — ED Notes (Signed)
Pt sts still unable to void. Pt requesting ultrasound to check on baby. Tresa Endo PA-C made aware.

## 2013-06-29 LAB — URINE CULTURE: Colony Count: 85000

## 2013-06-30 ENCOUNTER — Ambulatory Visit (INDEPENDENT_AMBULATORY_CARE_PROVIDER_SITE_OTHER): Payer: Medicaid Other | Admitting: Obstetrics

## 2013-06-30 ENCOUNTER — Encounter (HOSPITAL_COMMUNITY): Payer: Self-pay | Admitting: *Deleted

## 2013-06-30 ENCOUNTER — Encounter: Payer: Self-pay | Admitting: Obstetrics

## 2013-06-30 ENCOUNTER — Inpatient Hospital Stay (HOSPITAL_COMMUNITY)
Admission: AD | Admit: 2013-06-30 | Discharge: 2013-07-03 | DRG: 781 | Disposition: A | Discharge status: Home / Self Care | Payer: Medicaid Other | Source: Ambulatory Visit | Attending: Obstetrics & Gynecology | Admitting: Obstetrics & Gynecology

## 2013-06-30 VITALS — BP 126/88 | Temp 99.0°F | Wt 240.4 lb

## 2013-06-30 DIAGNOSIS — Z3480 Encounter for supervision of other normal pregnancy, unspecified trimester: Secondary | ICD-10-CM

## 2013-06-30 DIAGNOSIS — O210 Mild hyperemesis gravidarum: Secondary | ICD-10-CM

## 2013-06-30 DIAGNOSIS — O0990 Supervision of high risk pregnancy, unspecified, unspecified trimester: Secondary | ICD-10-CM | POA: Insufficient documentation

## 2013-06-30 DIAGNOSIS — Z3481 Encounter for supervision of other normal pregnancy, first trimester: Secondary | ICD-10-CM

## 2013-06-30 DIAGNOSIS — R1110 Vomiting, unspecified: Secondary | ICD-10-CM | POA: Diagnosis present

## 2013-06-30 DIAGNOSIS — E669 Obesity, unspecified: Secondary | ICD-10-CM | POA: Diagnosis present

## 2013-06-30 LAB — COMPREHENSIVE METABOLIC PANEL
AST: 15 U/L (ref 0–37)
CO2: 23 mEq/L (ref 19–32)
Calcium: 9.3 mg/dL (ref 8.4–10.5)
Creatinine, Ser: 0.85 mg/dL (ref 0.50–1.10)
GFR calc Af Amer: >90 mL/min (ref >90)
GFR calc non Af Amer: >90 mL/min (ref >90)

## 2013-06-30 LAB — URINALYSIS, ROUTINE W REFLEX MICROSCOPIC
Glucose, UA: NEGATIVE mg/dL
Protein, ur: NEGATIVE mg/dL
Urobilinogen, UA: 0.2 mg/dL (ref 0.0–1.0)

## 2013-06-30 LAB — URINE MICROSCOPIC-ADD ON

## 2013-06-30 MED ORDER — DOCUSATE SODIUM 100 MG PO CAPS
100.0000 mg | ORAL_CAPSULE | Freq: Every day | ORAL | Status: DC
  Administered 2013-07-01 – 2013-07-03 (×3): 100 mg via ORAL
  Filled 2013-06-30 (×3): qty 1

## 2013-06-30 MED ORDER — METHYLPREDNISOLONE 4 MG PO TABS: 8.0000 mg | ORAL_TABLET | Freq: Every day | ORAL | Status: DC

## 2013-06-30 MED ORDER — METOCLOPRAMIDE HCL 5 MG/ML IJ SOLN
5.0000 mg | Freq: Four times a day (QID) | INTRAMUSCULAR | Status: DC
  Administered 2013-06-30 – 2013-07-03 (×11): 5 mg via INTRAVENOUS
  Filled 2013-06-30 (×11): qty 2

## 2013-06-30 MED ORDER — PANTOPRAZOLE SODIUM 40 MG IV SOLR
40.0000 mg | Freq: Two times a day (BID) | INTRAVENOUS | Status: DC
  Administered 2013-06-30: 40 mg via INTRAVENOUS
  Filled 2013-06-30 (×3): qty 40

## 2013-06-30 MED ORDER — METHYLPREDNISOLONE 4 MG PO TABS: 4.0000 mg | ORAL_TABLET | Freq: Every day | ORAL | Status: DC

## 2013-06-30 MED ORDER — METHYLPREDNISOLONE 16 MG PO TABS
16.0000 mg | ORAL_TABLET | Freq: Every day | ORAL | Status: DC
  Filled 2013-06-30 (×2): qty 1

## 2013-06-30 MED ORDER — ZOLPIDEM TARTRATE 5 MG PO TABS
5.0000 mg | ORAL_TABLET | Freq: Every evening | ORAL | Status: DC | PRN
Start: 2013-06-30 — End: 2013-07-03
  Administered 2013-07-02 (×2): 5 mg via ORAL
  Filled 2013-06-30 (×2): qty 1

## 2013-06-30 MED ORDER — METHYLPREDNISOLONE SODIUM SUCC 125 MG IJ SOLR
48.0000 mg | Freq: Once | INTRAMUSCULAR | Status: AC
  Administered 2013-06-30: 48 mg via INTRAVENOUS
  Filled 2013-06-30: qty 0.77

## 2013-06-30 MED ORDER — METHYLPREDNISOLONE 16 MG PO TABS
16.0000 mg | ORAL_TABLET | Freq: Every day | ORAL | Status: DC
  Filled 2013-06-30: qty 1

## 2013-06-30 MED ORDER — ACETAMINOPHEN 325 MG PO TABS
650.0000 mg | ORAL_TABLET | ORAL | Status: DC | PRN
  Administered 2013-07-01: 650 mg via ORAL
  Filled 2013-06-30: qty 2

## 2013-06-30 MED ORDER — METHYLPREDNISOLONE 16 MG PO TABS
16.0000 mg | ORAL_TABLET | Freq: Every day | ORAL | Status: DC
  Administered 2013-07-01: 16 mg via ORAL
  Filled 2013-06-30 (×2): qty 1

## 2013-06-30 MED ORDER — M.V.I. ADULT IV INJ
Freq: Once | INTRAVENOUS | Status: AC
  Administered 2013-06-30: 12:00:00 via INTRAVENOUS
  Filled 2013-06-30: qty 1000

## 2013-06-30 MED ORDER — CALCIUM CARBONATE ANTACID 500 MG PO CHEW: 2.0000 | CHEWABLE_TABLET | ORAL | Status: DC | PRN

## 2013-06-30 MED ORDER — POTASSIUM CHLORIDE IN NACL 20-0.45 MEQ/L-% IV SOLN
INTRAVENOUS | Status: DC
  Administered 2013-06-30 – 2013-07-02 (×8): via INTRAVENOUS
  Filled 2013-06-30 (×13): qty 1000

## 2013-06-30 NOTE — Progress Notes (Signed)
Pulse- 118 . Subjective:    Samantha Guzman is being seen today for her first obstetrical visit.  This is a planned pregnancy. She is at [redacted]w[redacted]d gestation. Her obstetrical history is significant for Severe Hyperemesis during pregnancies.. Relationship with FOB: significant other, not living together. Patient does intend to breast feed. Pregnancy history fully reviewed.  Menstrual History: OB History   Grav Para Term Preterm Abortions TAB SAB Ect Mult Living   4 1 1  0 2  2   1       Menarche age: 71 Patient's last menstrual period was 04/30/2013.    The following portions of the patient's history were reviewed and updated as appropriate: allergies, current medications, past family history, past medical history, past social history, past surgical history and problem list.  Review of Systems Pertinent items are noted in HPI.    Objective:    No exam performed today, patient has extreme N/V and dehydration..    Assessment:    Pregnancy at [redacted]w[redacted]d weeks    Plan:    Initial labs drawn. Prenatal vitamins.  Counseling provided regarding continued use of seat belts, cessation of alcohol consumption, smoking or use of illicit drugs; infection precautions i.e., influenza/TDAP immunizations, toxoplasmosis,CMV, parvovirus, listeria and varicella; workplace safety, exercise during pregnancy; routine dental care, safe medications, sexual activity, hot tubs, saunas, pools, travel, caffeine use, fish and methlymercury, potential toxins, hair treatments, varicose veins Weight gain recommendations reviewed: underweight/BMI< 18.5--> gain 28 - 40 lbs; normal weight/BMI 18.5 - 24.9--> gain 25 - 35 lbs; overweight/BMI 25 - 29.9--> gain 15 - 25 lbs; obese/BMI >30->gain  11 - 20 lbs Problem list reviewed and updated. AFP3 discussed: requested. Role of ultrasound in pregnancy discussed; fetal survey: requested. Amniocentesis discussed: not indicated. Follow up in 2 weeks. 100% of 15 min visit spent on  counseling and coordination of care.

## 2013-06-30 NOTE — Progress Notes (Signed)
INITIAL NUTRITION ASSESSMENT  DOCUMENTATION CODES Per approved criteria  -Morbid Obesity   INTERVENTION: C/L diet, advance to antenatal regular as tolerated Noted initiation of steroid taper  NUTRITION DIAGNOSIS: Inadequate oral intake  related to Hyperemesis  as evidenced by weight loss, n/v.   Goal: Tolerance of po intake without nausea  Monitor:  weight  Reason for Assessment: MD Consult  27 y.o. female  Admitting Dx: <principal problem not specified>  ASSESSMENT: Hungry, tolerated C/L tray. Reports a 10 lb weight loss over 3 weeks. Weight recorded 3/13 was 268 lbs, which is not consistent with pts report.  Hyperemesis with previous pregnancy, controlled with reglan pump. Reports no weight gain during last pregnancy. Weight loss not significant enough to be malnourished, this pregnancy  Height: Ht Readings from Last 1 Encounters:  06/15/13 5\' 7"  (1.702 m)    Weight: Wt Readings from Last 1 Encounters:  06/30/13 240 lb 6.4 oz (109.045 kg)    Ideal Body Weight: 135 lbs  % Ideal Body Weight: 177%  Wt Readings from Last 10 Encounters:  06/30/13 240 lb 6.4 oz (109.045 kg)  06/15/13 250 lb 9.6 oz (113.671 kg)  01/16/13 268 lb (121.564 kg)  05/03/12 286 lb (129.729 kg)  12/06/11 290 lb (131.543 kg)    Usual Body Weight: 250 lbs per Pt  % Usual Body Weight: 96%  BMI:  There is no weight on file to calculate BMI. Prepregnancy BMI 39.3  Estimated Nutritional Needs: Kcal: 1800-2000 Protein: 70-80 Fluid: 2.2 L    Diet Order: Clear Liquid  EDUCATION NEEDS: -Education needs addressed. Reviewed diet for hyperemesis with pt, answered questions. AND Handout on diet during morning sickness left as reference  No intake or output data in the 24 hours ending 06/30/13 1547   Labs:   Recent Labs Lab 06/27/13 1751 06/30/13 1100  NA 138 132*  K 3.2* 3.6  CL 102 96  CO2  --  23  BUN 5* 6  CREATININE 0.80 0.85  CALCIUM  --  9.3  GLUCOSE 87 100*    CBG  (last 3)  No results found for this basename: GLUCAP,  in the last 72 hours  Scheduled Meds: . docusate sodium  100 mg Oral Daily  . [START ON 07/01/2013] methylPREDNISolone  16 mg Oral Q breakfast   Followed by  . [START ON 07/05/2013] methylPREDNISolone  8 mg Oral Q breakfast   Followed by  . [START ON 07/12/2013] methylPREDNISolone  4 mg Oral Q breakfast  . [START ON 07/01/2013] methylPREDNISolone  16 mg Oral Q1400   Followed by  . [START ON 07/03/2013] methylPREDNISolone  8 mg Oral Q1400   Followed by  . [START ON 07/06/2013] methylPREDNISolone  4 mg Oral Q1400  . [START ON 07/01/2013] methylPREDNISolone  16 mg Oral QHS   Followed by  . [START ON 07/04/2013] methylPREDNISolone  8 mg Oral QHS   Followed by  . [START ON 07/07/2013] methylPREDNISolone  4 mg Oral QHS  . metoCLOPramide (REGLAN) injection  5 mg Intravenous Q6H  . pantoprazole (PROTONIX) IV  40 mg Intravenous Q12H    Continuous Infusions: . 0.45 % NaCl with KCl 20 mEq / L      Past Medical History  Diagnosis Date  . No significant past medical history   . Vertigo     Past Surgical History  Procedure Laterality Date  . Cesarean section      Elisabeth Cara M.Odis Luster LDN Neonatal Nutrition Support Specialist Pager [phone]

## 2013-06-30 NOTE — H&P (Signed)
Samantha Guzman is a 27 y.o. female presenting for N/V and dehydration. Maternal Medical History:  Reason for admission: Nausea.  27 yo G4 P1021.  EDC 02-04-13.  Presents with severe N/V and dehydration.  Can't keep anything down.    OB History   Grav Para Term Preterm Abortions TAB SAB Ect Mult Living   4 1 1  0 2  2   1      Past Medical History  Diagnosis Date  . No significant past medical history   . Vertigo    Past Surgical History  Procedure Laterality Date  . Cesarean section     Family History: family history is not on file. Social History:  reports that she has quit smoking. She does not have any smokeless tobacco history on file. She reports that she does not drink alcohol or use illicit drugs.   Prenatal Transfer Tool  Maternal Diabetes: No Genetic Screening: not done yet Maternal Ultrasounds/Referrals: Normal Fetal Ultrasounds or other Referrals:  Referred to Materal Fetal Medicine  Maternal Substance Abuse:  No Significant Maternal Medications:  None Significant Maternal Lab Results:  Lab values include: Other:  CMET Other Comments:  H/O severe hyperemesis with previous pregnancies.  Review of Systems  Gastrointestinal: Positive for nausea and vomiting.      Blood pressure 126/88, temperature 99 F (37.2 C), weight 240 lb 6.4 oz (109.045 kg), last menstrual period 04/30/2013. Maternal Exam:  Abdomen: Patient reports no abdominal tenderness.   Physical Exam  Nursing note and vitals reviewed. Constitutional: She is oriented to person, place, and time. She appears well-developed and well-nourished.  HENT:  Head: Normocephalic and atraumatic.  Eyes: Conjunctivae are normal. Pupils are equal, round, and reactive to light.  Neck: Normal range of motion. Neck supple.  Cardiovascular: Normal rate and regular rhythm.   Respiratory: Effort normal and breath sounds normal.  GI: Soft.  Neurological: She is alert and oriented to person, place, and time.  Skin: Skin  is warm and dry.  Psychiatric: She has a normal mood and affect. Her behavior is normal. Judgment and thought content normal.    Prenatal labs: ABO, Rh:   Antibody:   Rubella:   RPR:    HBsAg:    HIV:    GBS:     Assessment/Plan: 8 weeks.  Severe N/V and dehydration.  Admit.  Antiemetic therapy.   [name] A 06/30/2013, 10:21 AM

## 2013-07-01 ENCOUNTER — Encounter (HOSPITAL_COMMUNITY): Payer: Medicaid Other

## 2013-07-01 MED ORDER — DOXYLAMINE SUCCINATE (SLEEP) 25 MG PO TABS
25.0000 mg | ORAL_TABLET | Freq: Every day | ORAL | Status: DC
  Administered 2013-07-01 – 2013-07-03 (×2): 25 mg via ORAL
  Filled 2013-07-01 (×3): qty 1

## 2013-07-01 MED ORDER — VITAMIN B-6 100 MG PO TABS
100.0000 mg | ORAL_TABLET | Freq: Every day | ORAL | Status: DC
  Administered 2013-07-01 – 2013-07-02 (×2): 100 mg via ORAL
  Filled 2013-07-01 (×3): qty 1

## 2013-07-01 MED ORDER — PANTOPRAZOLE SODIUM 40 MG PO TBEC
40.0000 mg | DELAYED_RELEASE_TABLET | Freq: Two times a day (BID) | ORAL | Status: DC
  Administered 2013-07-01 – 2013-07-03 (×5): 40 mg via ORAL
  Filled 2013-07-01 (×8): qty 1

## 2013-07-01 NOTE — Progress Notes (Signed)
Home Health Care choice offered to patient:   HOME HEALTH AGENCIES SERVING [place]   Agencies that are Medicare-Certified and are affiliated with [facility] Home Health Agency  Telephone Number Address  [facility]   The [facility] has ownership interest in this company; however, you are under no obligation to use this agency. [phone] or  [phone] [address] [place], [place] [address] [email or web address]   Agencies that are Medicare-Certified and are not affiliated with [facility] Agency Telephone Number Address  [facility] [phone] Fax [phone] [address] [place], [place]  [address] [email or web address]  [facility] [phone] or [phone] Fax [phone] [address] [place], [place] [address] [email or web address]  [facility] [phone] Fax [phone] [address] [place], [place] [address] [email or web address]  [facility] [phone] Fax [phone] [address] [place], [place]  [address] [email or web address]  [facility] [phone] Fax [phone] [address] [place], [place] [address] [email or web address]  [facility] [phone] [address] [place], [place] [address] [email or web address]  [facility] [phone]  [address] [place], [place] [address] [email or web address]  [facility] [phone] or [phone] Fax number [phone] [address] [place], [place]  [address] [email or web address]  [facility] [phone] Fax [phone] [address] [place], [place]  [address]  [facility]  [phone] Fax [phone] [address] [place], [place] [address] [email or web address]       Agencies that are not Medicare-Certified and are not affiliated with [facility] Agency Telephone Number Address  [facility], [place] [phone] or [phone] Fax [phone] [address] [place]  [address] [email or web address]  [facility] [phone] Fax [phone] [address] [place], [place]  [address] [email or web address]   [facility] [phone] Fax [phone] [address] [place], [place]  [address] [email or web address]   [facility] [place]  [phone] Fax [phone] [address] [place], [place]  [address] [email or web address]   [facility]  [phone] Fax [phone] [address] [place], [place] [address] [email or web address]  [facility] [phone] [address] [place], [place]  [address] [email or web address]   [facility], [place] [phone] Fax [phone] [address] [place], [place]  [address] [email or web address]    [facility] [phone] Fax [phone] [address] [place], [place] [address]  [facility] [phone] or [phone] Fax [phone] [address] [place], [place]  [address] [email or web address]  [facility] [phone]  [facility] [phone] Fax for both [phone] [address]  [place], [place] [address] [address]  [place]  [address] [email or web address]   [facility] [place] [phone] or [phone] Fax [phone]  [place], [place]   [address] [email or web address]  [facility] [phone] Fax [phone] [address] [place], [place]  [address] [email or web address]  [facility] [phone] [address] [place], [place]  [address]  [facility] [phone] Fax [phone] [address] [place], [place]  [address]  [facility]  [phone] Fax [phone] [address] [place], [place]  [address] [email or web address]   [facility] [phone] Fax [phone] [address] [place], [place]  [address] [email or web address]   [facility]  [phone] Fax [phone]  [address] [place], [place] [address] [email or web address]  [facility] [phone] Fax [phone] [address] [place], [place]  [address] [email or web address]

## 2013-07-01 NOTE — Care Management Note (Unsigned)
    Page 1 of 2   07/02/2013     6:15:13 PM   CARE MANAGEMENT NOTE 07/02/2013  Patient:  Samantha Guzman, Samantha Guzman   Account Number:  [id number]  Date Initiated:  07/01/2013  Documentation initiated by:  Roseanne Reno  Subjective/Objective Assessment:   [redacted] wks gestation w/ Hyperemesis.     Action/Plan:   Possible home IVF's and/or Reglan Pump.   Anticipated DC Date:  07/03/2013   Anticipated DC Plan:  HOME W HOME HEALTH SERVICES      DC Planning Services  CM consult      Choice offered to / List presented to:  C-1 Patient           Status of service:  In process, will continue to follow Medicare Important Message given?   (If response is "NO", the following Medicare IM given date fields will be blank) Date Medicare IM given:   Date Additional Medicare IM given:    Discharge Disposition:  HOME W HOME HEALTH SERVICES  Per UR Regulation:    If discussed at Long Length of Stay Meetings, dates discussed:    Comments:   07/02/13 1810 L. Floyce Stakes [facility] BSN- Spoke to patient at bedside and reviewed demographics with patient. Patient stated when she is discharged she will be staying at : [address] [place], [place] [address].  Phone number is correct on the face sheet.  Patient stated she is feeling better and tolering food now.  Please call Nurse Care Manager if Home Health is needed prior to discharge.  Will continue to follow.  07/02/13 0900 L. Floyce Stakes [facility] BSN- Nurse Care Manager spoke to Dr. Clearance Coots at hospital regarding possible home health needs for patient.  Stated that guidelines for Reglan pump were in the shadow chart on unit and if [facility] is needed for patient it can be set up.  No orders at this time for patien to have Home Health.  Will continue to follow.  07/01/13  1530p  Spoke w/ pt at bedside in room 309, discussed possible HHC needs.  Stated that she had HHC when she lived in [place].  She thinks that she will eventually have to have a cerclage as she did with her previous  pregnancy.  We discussed HHC and agenices, choice offered, chose [facility].  Pt's nurse spoke w/ MD about HHC issues and what orders would need to be addressed if [facility] is needed. Awaiting MD orders at this time.  CM available to assist as needed.  Hessie Diener    [phone]   07/01/13  1000a  Noted CM referral, spoke w/ pt's nurse about needs.  MD wanted to see if a Reglan pump would be available for use as this helped w/ the pts previous pregnancy.  I spoke w/ Baxter Hire at [facility] who stated that w/ Medicaid, the Reglan pump would be covered at 100%.  I gave the pts nurse a list of orders that would need to be addressed if ordering the Reglan pump.  I also gave her a list of orders that would need to be addressed if IVF's were ordered for home.  Pts nurse will speak with the MD about possible needs.  TJohnson, RNBSN   [phone]

## 2013-07-01 NOTE — Consult Note (Signed)
MATERNAL FETAL MEDICINE CONSULT  Patient Name: Samantha Guzman Medical Record Number:  [id number] Date of Birth: 1985-12-18 Requesting Physician Name:  Antionette Char, MD Date of Service: 07/01/2013  Chief Complaint Hyperemesis gravidarum  History of Present Illness Lajoya Dombek was seen today secondary to hyperemesis gravidarum at the request of Antionette Char, MD.  The patient is a 27 y.o. [phone] [redacted]w[redacted]d with an EDD of 02/04/2014, by 8 weeks ultrasound.  She vomits numerous times per day and just prior to admission was not able to tolerate any oral intake.  She feels much better now since admission and is able to tolerate oral fluid and bland food.  She has similar difficulties with nausea and vomiting in all of her prior pregnancies.  Her first two pregnancies were complicated by 20 week deliveries due to cervical insufficiency.  In her most recent pregnancy she had a prophylactic cerclage placed and was on weekly 17-hydroxyprogesterone.  She delivered via LTCS at 37 weeks in that pregnancy.  Review of Systems Pertinent items are noted in HPI.  Patient History OB History  Gravida Para Term Preterm AB SAB TAB Ectopic Multiple Living  4 1 1  0 2 2    1     # Outcome Date GA Lbr Len/2nd Weight Sex Delivery Anes PTL Lv  4 CUR           3 TRM 04/06/10 [redacted]w[redacted]d   F LTCS  Y Y  2 SAB 09/06/08 [redacted]w[redacted]d       N  1 SAB 02/05/08 [redacted]w[redacted]d       N      Past Medical History  Diagnosis Date  . No significant past medical history   . Vertigo     Past Surgical History  Procedure Laterality Date  . Cesarean section      History   Social History  . Marital Status: Single    Spouse Name: N/A    Number of Children: N/A  . Years of Education: N/A   Social History Main Topics  . Smoking status: Former Smoker    Quit date: 06/06/2009  . Smokeless tobacco: None  . Alcohol Use: No     Comment: occ  . Drug Use: No  . Sexual Activity: Yes    Birth Control/ Protection: None   Other Topics  Concern  . None   Social History Narrative  . None    Physical Examination Filed Vitals:   07/01/13 0542  BP: 109/67  Pulse: 63  Temp: 98.5 F (36.9 C)  Resp: 16   General appearance - alert, well appearing, and in no distress Abdomen - soft, nontender, nondistended, no masses or organomegaly Extremities - no pedal edema noted  Labs  Results for orders placed during the hospital encounter of 06/30/13 (from the past 24 hour(s))  COMPREHENSIVE METABOLIC PANEL     Status: Abnormal   Collection Time    06/30/13 11:00 AM      Result Value Range   Sodium 132 (*) 135 - 145 mEq/L   Potassium 3.6  3.5 - 5.1 mEq/L   Chloride 96  96 - 112 mEq/L   CO2 23  19 - 32 mEq/L   Glucose, Bld 100 (*) 70 - 99 mg/dL   BUN 6  6 - 23 mg/dL   Creatinine, Ser 7.82  0.50 - 1.10 mg/dL   Calcium 9.3  8.4 - 95.6 mg/dL   Total Protein 7.5  6.0 - 8.3 g/dL   Albumin 3.6  3.5 - 5.2 g/dL  AST 15  0 - 37 U/L   ALT 16  0 - 35 U/L   Alkaline Phosphatase 84  39 - 117 U/L   Total Bilirubin 0.3  0.3 - 1.2 mg/dL   GFR calc non Af Amer >90  >90 mL/min   GFR calc Af Amer >90  >90 mL/min  TSH     Status: None   Collection Time    06/30/13 11:00 AM      Result Value Range   TSH 1.265  0.350 - 4.500 uIU/mL  URINALYSIS, ROUTINE W REFLEX MICROSCOPIC     Status: Abnormal   Collection Time    06/30/13  2:02 PM      Result Value Range   Color, Urine YELLOW  YELLOW   APPearance CLEAR  CLEAR   Specific Gravity, Urine 1.025  1.005 - 1.030   pH 7.0  5.0 - 8.0   Glucose, UA NEGATIVE  NEGATIVE mg/dL   Hgb urine dipstick NEGATIVE  NEGATIVE   Bilirubin Urine SMALL (*) NEGATIVE   Ketones, ur >80 (*) NEGATIVE mg/dL   Protein, ur NEGATIVE  NEGATIVE mg/dL   Urobilinogen, UA 0.2  0.0 - 1.0 mg/dL   Nitrite NEGATIVE  NEGATIVE   Leukocytes, UA TRACE (*) NEGATIVE  URINE MICROSCOPIC-ADD ON     Status: Abnormal   Collection Time    06/30/13  2:02 PM      Result Value Range   Squamous Epithelial / LPF FEW (*) RARE    WBC, UA 7-10  <3 WBC/hpf   RBC / HPF 0-2  <3 RBC/hpf   Bacteria, UA MANY (*) RARE   Urine-Other MUCOUS PRESENT    GLUCOSE, RANDOM     Status: Abnormal   Collection Time    07/01/13  5:35 AM      Result Value Range   Glucose, Bld 125 (*) 70 - 99 mg/dL    Assessment and Recommendations 1.  Hyperemesis gravidarum.  Fortunately  Ms. Novitsky hyperemesis has not lead to any serious electrolyte abnormalities.  She is already feeling better with the IV hydration and anti-emetic therapy she has been receiving since admission.  I recommend continuing the current strategy of multi-agent therapy.  She is already getting both metoclopramide and ondansetron intravenously.  As these agents have been effective I would continue them but switch to po administration now that she is tolerating oral intake.  I would also add oral vitamin B6 and doxylamine.  This can be prescribed separately or given as a combination Diclegis formulation.  I advised Ms. Tourigny on the dietary modifications that often help alleviate nausea and vomiting of pregnancy such as eating small frequent meals of mostly bland food.  She will also likely need intravenous hydration often throughout the rest of the first trimester.  Arrangements should be made for her to receive this fluid conveniently as an outpatient.  Ms. Gasner has received 2 dose of methylprednisolone last night and this morning.  Although this can be an effective treatment for hyperemesis gravidarum, treatment with corticosteroids prior to 10 weeks of pregnancy is associated with an increased risk of fetal cleft lip and palate.  I would discontinue this medication at this time and readdress the need for it after 10 weeks.   Rema Fendt, MD

## 2013-07-01 NOTE — Progress Notes (Signed)
Order from Dr. Clearance Coots in order history to discontinue taper.

## 2013-07-01 NOTE — Progress Notes (Signed)
Per Dr. Rema Fendt note, methylprednisolone should be dc'd due to increased risk for fetal cleft lip and palate. Verbal orders given in report for med to be dc'd by Salena Saner, RN.

## 2013-07-02 MED ORDER — GLYCOPYRROLATE 1 MG PO TABS
1.0000 mg | ORAL_TABLET | Freq: Three times a day (TID) | ORAL | Status: DC
  Administered 2013-07-02 – 2013-07-03 (×3): 1 mg via ORAL
  Filled 2013-07-02 (×7): qty 1

## 2013-07-02 NOTE — Progress Notes (Signed)
Ur chart review completed.  

## 2013-07-02 NOTE — Progress Notes (Signed)
Patient ID: Samantha Guzman, female   DOB: 1986/08/30, 27 y.o.   MRN: [id number] Hospital Day: 3  S: Less nausea.  Tolerating diet..  O: Blood pressure 111/73, pulse 77, temperature 98.8 F (37.1 C), temperature source Oral, resp. rate 18, height 5\' 7"  (1.702 m), weight 242 lb 8 oz (109.997 kg), last menstrual period 04/30/2013, SpO2 100.00%.   FHT:not done Toco: None SVE:   A/P- 27 y.o. admitted with: Severe N/V and dehydration.  Responded well to antiemetics.  Continue current management.  Present on Admission:  **None**  Pregnancy Complications: N/V  Preterm labor management: no treatment necessary Dating:  [redacted]w[redacted]d PNL Needed:  none FWB:  good PTL:  none

## 2013-07-03 DIAGNOSIS — R1110 Vomiting, unspecified: Secondary | ICD-10-CM | POA: Diagnosis present

## 2013-07-03 LAB — URINE CULTURE

## 2013-07-03 MED ORDER — PANTOPRAZOLE SODIUM 40 MG PO TBEC: 40.0000 mg | DELAYED_RELEASE_TABLET | Freq: Two times a day (BID) | ORAL | Status: DC

## 2013-07-03 MED ORDER — METOCLOPRAMIDE HCL 10 MG PO TABS: 10.0000 mg | ORAL_TABLET | Freq: Three times a day (TID) | ORAL | Status: DC

## 2013-07-03 MED ORDER — DOXYLAMINE-PYRIDOXINE 10-10 MG PO TBEC: 10.0000 mg | DELAYED_RELEASE_TABLET | Freq: Every evening | ORAL | Status: DC | PRN

## 2013-07-03 MED ORDER — METOCLOPRAMIDE HCL 10 MG PO TABS
10.0000 mg | ORAL_TABLET | Freq: Three times a day (TID) | ORAL | Status: DC
  Administered 2013-07-03 (×2): 10 mg via ORAL
  Filled 2013-07-03 (×2): qty 1

## 2013-07-03 NOTE — Progress Notes (Signed)
Pts teaching complete  No nausea  And vomiting  Noted out in wheelchair

## 2013-07-03 NOTE — Discharge Summary (Signed)
  Physician Discharge Summary  Patient ID: Samantha Guzman MRN: [id number] DOB/AGE: 10-Jun-1986 27 y.o.  Admit date: 06/30/2013 Discharge date: 07/03/2013  Admission Diagnoses: Active Problems:   Hyperemesis gravidarum   Discharge Diagnoses:  Active Problems:   Hyperemesis gravidarum   Discharged Condition: stable  Hospital Course: She was started on antiemetics and given IV hydration.  Maternal fetal medicine was consulted and agreed with the multidrug regimen.  A dietician was consulted and it was felt that the patient's nutritional status was stable. The nausea was controlled at discharge and she was tolerating a regular diet.  Consults: see above  Significant Diagnostic Studies: labs: metabolic profile  Treatments: see above  Discharge Exam: Blood pressure 113/50, pulse 72, temperature 98.2 F (36.8 C), temperature source Oral, resp. rate 18, height 5\' 7"  (1.702 m), weight 250 lb 12 oz (113.739 kg), last menstrual period 04/30/2013, SpO2 100.00%. General appearance: alert GI: soft, non-tender; bowel sounds normal; no masses,  no organomegaly Extremities: extremities normal, atraumatic, no cyanosis or edema  Disposition: 01-Home or Self Care      Discharge Orders   Future Appointments Provider Department Dept Phone   07/16/2013 11:45 AM Brock Bad, MD [facility] [phone]   Future Orders Complete By Expires   Discharge activity:  No Restrictions  As directed    Discharge diet:  As directed    Comments:     Bland/BRATT   No sexual activity restrictions  As directed        Medication List    STOP taking these medications       promethazine 12.5 MG tablet  Commonly known as:  PHENERGAN      TAKE these medications       Doxylamine-Pyridoxine 10-10 MG Tbec  Commonly known as:  DICLEGIS  Take 10 mg by mouth at bedtime as needed. Take 2 tabs at bedtime     metoCLOPramide 10 MG tablet  Commonly known as:  REGLAN  Take 1 tablet (10 mg total) by  mouth 3 (three) times daily before meals.     ondansetron 4 MG tablet  Commonly known as:  ZOFRAN  Take 1 tablet (4 mg total) by mouth every 6 (six) hours as needed for nausea.     pantoprazole 40 MG tablet  Commonly known as:  PROTONIX  Take 1 tablet (40 mg total) by mouth 2 (two) times daily.       Follow-up Information   Follow up with HARPER,CHARLES A, MD In 1 week.   Specialty:  Obstetrics and Gynecology   Contact information:   [address] [place] [address] [phone]       Signed: Roseanna Rainbow 07/03/2013, 2:04 PM

## 2013-07-09 ENCOUNTER — Ambulatory Visit (INDEPENDENT_AMBULATORY_CARE_PROVIDER_SITE_OTHER): Payer: Medicaid Other | Admitting: Obstetrics

## 2013-07-09 ENCOUNTER — Inpatient Hospital Stay (HOSPITAL_COMMUNITY)
Admission: AD | Admit: 2013-07-09 | Discharge: 2013-07-09 | Disposition: A | Discharge status: Home / Self Care | Payer: Medicaid Other | Source: Ambulatory Visit | Attending: Obstetrics & Gynecology | Admitting: Obstetrics & Gynecology

## 2013-07-09 ENCOUNTER — Encounter (HOSPITAL_COMMUNITY): Payer: Self-pay | Admitting: *Deleted

## 2013-07-09 VITALS — BP 120/82 | Temp 98.7°F

## 2013-07-09 DIAGNOSIS — O210 Mild hyperemesis gravidarum: Secondary | ICD-10-CM | POA: Insufficient documentation

## 2013-07-09 DIAGNOSIS — O211 Hyperemesis gravidarum with metabolic disturbance: Secondary | ICD-10-CM

## 2013-07-09 MED ORDER — LACTATED RINGERS IV SOLN
INTRAVENOUS | Status: DC
  Administered 2013-07-09 (×2): via INTRAVENOUS

## 2013-07-09 MED ORDER — ONDANSETRON HCL 4 MG/2ML IJ SOLN
4.0000 mg | Freq: Once | INTRAMUSCULAR | Status: AC
  Administered 2013-07-09: 4 mg via INTRAVENOUS
  Filled 2013-07-09: qty 2

## 2013-07-09 MED ORDER — VITAMIN B-6 100 MG PO TABS: 100.0000 mg | ORAL_TABLET | Freq: Every day | ORAL | Status: DC

## 2013-07-09 MED ORDER — METOCLOPRAMIDE HCL 5 MG/ML IJ SOLN
10.0000 mg | Freq: Once | INTRAMUSCULAR | Status: AC
  Administered 2013-07-09: 10 mg via INTRAVENOUS
  Filled 2013-07-09: qty 2

## 2013-07-09 NOTE — MAU Note (Signed)
Pt presents with complaints of nausea and vomiting throughout the pregnancy but states that she has not been able to keep anything down since Friday.

## 2013-07-09 NOTE — MAU Provider Note (Signed)
History     CSN: [id number]  Arrival date and time: 07/09/13 1420   None     Chief Complaint  Patient presents with  . Morning Sickness  . Emesis During Pregnancy   HPI This is a 27 y.o. female at [redacted]w[redacted]d who presents for IV hydration due to persistent hyperemesis. Had been doing well on meds at home until yesterday. Now having trouble keeping anything down.   RN Note:  Patient is in the office for follow up to hospital visit last week. Patient has hyperemesis with a history of Reglan pump usage with her first pregnancy. Patient states she was doing fine until yesterday when the symptoms came back. Patient is vomiting constantly- can not stop.Patient has been using Reglan oral. Per Dr Clearance Coots- patient sent to MAU for fluids and IV medications for nausea and vomiting.        OB History   Grav Para Term Preterm Abortions TAB SAB Ect Mult Living   4 1 1  0 2  2   1       Past Medical History  Diagnosis Date  . No significant past medical history   . Vertigo     Past Surgical History  Procedure Laterality Date  . Cesarean section      History reviewed. No pertinent family history.  History  Substance Use Topics  . Smoking status: Former Smoker    Quit date: 06/06/2009  . Smokeless tobacco: Not on file  . Alcohol Use: No     Comment: occ    Allergies: No Known Allergies  Prescriptions prior to admission  Medication Sig Dispense Refill  . metoCLOPramide (REGLAN) 10 MG tablet Take 1 tablet (10 mg total) by mouth 3 (three) times daily before meals.  60 tablet  2  . ondansetron (ZOFRAN) 4 MG tablet Take 1 tablet (4 mg total) by mouth every 6 (six) hours as needed for nausea.  20 tablet  0  . pantoprazole (PROTONIX) 40 MG tablet Take 1 tablet (40 mg total) by mouth 2 (two) times daily.  60 tablet  3    Review of Systems  Constitutional: Negative for fever, chills and malaise/fatigue.  Gastrointestinal: Positive for nausea and vomiting. Negative for abdominal pain,  diarrhea and constipation.  Genitourinary: Negative for dysuria.  Neurological: Positive for weakness. Negative for dizziness and headaches.   Physical Exam   Blood pressure 130/84, pulse 94, temperature 98.2 F (36.8 C), temperature source Oral, resp. rate 16, height 5\' 7"  (1.702 m), weight 107.14 kg (236 lb 3.2 oz), last menstrual period 04/30/2013.  Physical Exam  Constitutional: She is oriented to person, place, and time. She appears well-developed. No distress.  HENT:  Head: Normocephalic.  Cardiovascular: Normal rate.   Respiratory: Effort normal.  GI: Soft. She exhibits no distension. There is no tenderness. There is no rebound and no guarding.  Musculoskeletal: Normal range of motion.  Neurological: She is alert and oriented to person, place, and time.  Skin: Skin is warm and dry.  Psychiatric: She has a normal mood and affect.   Fetal heart tones auscultated at 160  MAU Course  Procedures  MDM Given LR infusion with Zofran and Reglan. >> felt better and was able to keep down fluids.   Assessment and Plan  A:  SIUP at [redacted]w[redacted]d       Hyperemesis  P:  Discharge home       Will add B6 to her regimen       Followup in office  Wynelle Bourgeois 07/09/2013, 2:58 PM

## 2013-07-09 NOTE — Progress Notes (Signed)
Patient is in the office for follow up to hospital visit last week. Patient has hyperemesis with a history of Reglan pump usage with her first pregnancy. Patient states she was doing fine until yesterday when the symptoms came back. Patient is vomiting constantly- can not stop.Patient has been using Reglan oral.  Per Dr Clearance Coots- patient sent to MAU for fluids and IV medications for nausea and vomiting.

## 2013-07-10 ENCOUNTER — Encounter: Payer: Self-pay | Admitting: Obstetrics

## 2013-07-16 ENCOUNTER — Encounter: Payer: Self-pay | Admitting: Obstetrics

## 2013-07-16 ENCOUNTER — Ambulatory Visit (INDEPENDENT_AMBULATORY_CARE_PROVIDER_SITE_OTHER): Payer: Medicaid Other | Admitting: Obstetrics

## 2013-07-16 VITALS — BP 123/80 | Temp 98.2°F | Wt 241.8 lb

## 2013-07-16 DIAGNOSIS — O0990 Supervision of high risk pregnancy, unspecified, unspecified trimester: Secondary | ICD-10-CM

## 2013-07-16 DIAGNOSIS — O3430 Maternal care for cervical incompetence, unspecified trimester: Secondary | ICD-10-CM | POA: Insufficient documentation

## 2013-07-16 DIAGNOSIS — Z3481 Encounter for supervision of other normal pregnancy, first trimester: Secondary | ICD-10-CM

## 2013-07-16 DIAGNOSIS — O210 Mild hyperemesis gravidarum: Secondary | ICD-10-CM

## 2013-07-16 DIAGNOSIS — Z3480 Encounter for supervision of other normal pregnancy, unspecified trimester: Secondary | ICD-10-CM

## 2013-07-16 DIAGNOSIS — O3431 Maternal care for cervical incompetence, first trimester: Secondary | ICD-10-CM

## 2013-07-16 NOTE — Addendum Note (Signed)
Addended by: George Hugh on: 07/16/2013 01:01 PM   Modules accepted: Orders

## 2013-07-16 NOTE — Progress Notes (Signed)
Pulse: 92

## 2013-07-17 LAB — WET PREP BY MOLECULAR PROBE
Candida species: NEGATIVE
Gardnerella vaginalis: NEGATIVE
Trichomonas vaginosis: NEGATIVE

## 2013-07-17 LAB — OBSTETRIC PANEL
Antibody Screen: NEGATIVE
Eosinophils Absolute: 0.1 10*3/uL (ref 0.0–0.7)
HCT: 34.4 % — ABNORMAL LOW (ref 36.0–46.0)
Hemoglobin: 11.7 g/dL — ABNORMAL LOW (ref 12.0–15.0)
Hepatitis B Surface Ag: NEGATIVE
Lymphs Abs: 2.5 10*3/uL (ref 0.7–4.0)
MCH: 26.7 pg (ref 26.0–34.0)
MCHC: 34 g/dL (ref 30.0–36.0)
MCV: 78.5 fL (ref 78.0–100.0)
Monocytes Absolute: 0.5 10*3/uL (ref 0.1–1.0)
Monocytes Relative: 5 % (ref 3–12)
Neutrophils Relative %: 68 % (ref 43–77)
RBC: 4.38 MIL/uL (ref 3.87–5.11)
Rh Type: POSITIVE

## 2013-07-17 LAB — VARICELLA ZOSTER ANTIBODY, IGG: Varicella IgG: >4000 Index — ABNORMAL HIGH (ref <135.00)

## 2013-07-17 LAB — GC/CHLAMYDIA PROBE AMP: GC Probe RNA: NEGATIVE

## 2013-07-18 ENCOUNTER — Other Ambulatory Visit: Payer: Self-pay | Admitting: Obstetrics

## 2013-07-18 ENCOUNTER — Encounter (HOSPITAL_COMMUNITY): Payer: Self-pay | Admitting: *Deleted

## 2013-07-18 ENCOUNTER — Inpatient Hospital Stay (HOSPITAL_COMMUNITY)
Admission: AD | Admit: 2013-07-18 | Discharge: 2013-07-19 | Disposition: A | Discharge status: Home / Self Care | Payer: Medicaid Other | Source: Ambulatory Visit | Attending: Obstetrics | Admitting: Obstetrics

## 2013-07-18 DIAGNOSIS — O210 Mild hyperemesis gravidarum: Secondary | ICD-10-CM | POA: Insufficient documentation

## 2013-07-18 DIAGNOSIS — O3431 Maternal care for cervical incompetence, first trimester: Secondary | ICD-10-CM

## 2013-07-18 LAB — HEMOGLOBINOPATHY EVALUATION
Hemoglobin Other: 0 %
Hgb A: 96.9 % (ref 96.8–97.8)

## 2013-07-18 MED ORDER — PROMETHAZINE HCL 25 MG/ML IJ SOLN
Freq: Once | INTRAVENOUS | Status: AC
  Administered 2013-07-19: via INTRAVENOUS
  Filled 2013-07-18: qty 1000

## 2013-07-18 NOTE — MAU Provider Note (Signed)
History     CSN: [id number]  Arrival date and time: 07/18/13 2331   None     Chief Complaint  Patient presents with  . Morning Sickness   HPI Comments: Samantha Guzman 27 y.o. [id number]  presents to MAU for severe nausea and vomiting in pregnancy. Last pregnancy she had Reglan pump. Pt of Dr Clearance Coots.        Past Medical History  Diagnosis Date  . No significant past medical history   . Vertigo     Past Surgical History  Procedure Laterality Date  . Cesarean section      History reviewed. No pertinent family history.  History  Substance Use Topics  . Smoking status: Former Smoker    Quit date: 06/06/2009  . Smokeless tobacco: Not on file  . Alcohol Use: No     Comment: occ    Allergies: No Known Allergies  Prescriptions prior to admission  Medication Sig Dispense Refill  . metoCLOPramide (REGLAN) 10 MG tablet Take 1 tablet (10 mg total) by mouth 3 (three) times daily before meals.  60 tablet  2  . ondansetron (ZOFRAN) 4 MG tablet Take 1 tablet (4 mg total) by mouth every 6 (six) hours as needed for nausea.  20 tablet  0  . pantoprazole (PROTONIX) 40 MG tablet Take 1 tablet (40 mg total) by mouth 2 (two) times daily.  60 tablet  3  . pyridOXINE (VITAMIN B-6) 100 MG tablet Take 1 tablet (100 mg total) by mouth daily.  30 tablet  0    Review of Systems  HENT: Negative.   Eyes: Negative.   Cardiovascular: Negative.   Gastrointestinal: Positive for nausea and vomiting.  Genitourinary: Negative.        Unable to urinate since 12 noon  Musculoskeletal: Negative.   Skin: Negative.   Neurological: Positive for weakness.  Psychiatric/Behavioral: Negative.    Physical Exam   Blood pressure 119/75, pulse 127, temperature 98.9 F (37.2 C), temperature source Oral, resp. rate 18, last menstrual period 04/30/2013.  Physical Exam  Constitutional: She is oriented to person, place, and time. She appears well-developed and well-nourished. No distress.  HENT:  Head:  Normocephalic and atraumatic.  Eyes: Pupils are equal, round, and reactive to light.  Cardiovascular: Normal rate, regular rhythm and normal heart sounds.   Respiratory: Effort normal and breath sounds normal.  GI: Soft. Bowel sounds are normal. She exhibits no distension. There is no tenderness.  Genitourinary:  Not examined  Musculoskeletal: Normal range of motion.  Neurological: She is alert and oriented to person, place, and time.  Skin: Skin is warm and dry.  Psychiatric: She has a normal mood and affect. Her behavior is normal. Thought content normal.   Results for orders placed during the hospital encounter of 07/18/13 (from the past 24 hour(s))  URINALYSIS, ROUTINE W REFLEX MICROSCOPIC     Status: Abnormal   Collection Time    07/19/13  2:05 AM      Result Value Range   Color, Urine YELLOW  YELLOW   APPearance CLEAR  CLEAR   Specific Gravity, Urine 1.025  1.005 - 1.030   pH 6.0  5.0 - 8.0   Glucose, UA NEGATIVE  NEGATIVE mg/dL   Hgb urine dipstick NEGATIVE  NEGATIVE   Bilirubin Urine SMALL (*) NEGATIVE   Ketones, ur >80 (*) NEGATIVE mg/dL   Protein, ur NEGATIVE  NEGATIVE mg/dL   Urobilinogen, UA 1.0  0.0 - 1.0 mg/dL   Nitrite NEGATIVE  NEGATIVE  Leukocytes, UA TRACE (*) NEGATIVE  URINE MICROSCOPIC-ADD ON     Status: None   Collection Time    07/19/13  2:05 AM      Result Value Range   Squamous Epithelial / LPF RARE  RARE   WBC, UA 0-2  <3 WBC/hpf   RBC / HPF 0-2  <3 RBC/hpf   Urine-Other MUCOUS PRESENT       MAU Course  Procedures  MDM Start IVF LR with phenergan 25 mg, second was half in before she could give urine sample, 3rd liter MVI added Dr Clearance Coots called with patients status and he advised we could not get Reglan pump over weekend  Assessment and Plan   A: Hyperemesis Gravidarum P: Three liters IVF tonight Continue meds as prescribed Call Dr Thomes Lolling office on Monday  Carolynn Serve 07/18/2013, 11:52 PM

## 2013-07-18 NOTE — MAU Note (Signed)
Patient complains of nausea and vomiting. Medication stopped working last week. Is also having chest pain and pressure.

## 2013-07-18 NOTE — MAU Note (Signed)
Patient states she can not collect a urine at this time. She has been having a hard time urinating.

## 2013-07-19 LAB — URINE MICROSCOPIC-ADD ON

## 2013-07-19 LAB — URINALYSIS, ROUTINE W REFLEX MICROSCOPIC
Glucose, UA: NEGATIVE mg/dL
Ketones, ur: >80 mg/dL — AB
pH: 6 (ref 5.0–8.0)

## 2013-07-19 MED ORDER — ONDANSETRON 8 MG/NS 50 ML IVPB
8.0000 mg | Freq: Once | INTRAVENOUS | Status: AC
  Administered 2013-07-19: 8 mg via INTRAVENOUS
  Filled 2013-07-19: qty 8

## 2013-07-19 MED ORDER — THIAMINE HCL 100 MG/ML IJ SOLN
Freq: Once | INTRAVENOUS | Status: AC
  Administered 2013-07-19: 03:00:00 via INTRAVENOUS
  Filled 2013-07-19: qty 1000

## 2013-07-21 ENCOUNTER — Ambulatory Visit (HOSPITAL_COMMUNITY)
Admission: RE | Admit: 2013-07-21 | Discharge: 2013-07-21 | Disposition: A | Discharge status: Home / Self Care | Payer: Medicaid Other | Source: Ambulatory Visit | Attending: Obstetrics | Admitting: Obstetrics

## 2013-07-21 ENCOUNTER — Other Ambulatory Visit: Payer: Self-pay | Admitting: Obstetrics

## 2013-07-21 DIAGNOSIS — O3431 Maternal care for cervical incompetence, first trimester: Secondary | ICD-10-CM

## 2013-07-21 DIAGNOSIS — O3430 Maternal care for cervical incompetence, unspecified trimester: Secondary | ICD-10-CM | POA: Insufficient documentation

## 2013-07-21 DIAGNOSIS — O34219 Maternal care for unspecified type scar from previous cesarean delivery: Secondary | ICD-10-CM | POA: Insufficient documentation

## 2013-07-23 ENCOUNTER — Encounter: Payer: Medicaid Other | Admitting: Obstetrics

## 2013-07-24 ENCOUNTER — Ambulatory Visit (INDEPENDENT_AMBULATORY_CARE_PROVIDER_SITE_OTHER): Payer: Medicaid Other | Admitting: Obstetrics

## 2013-07-24 VITALS — BP 128/80 | Temp 98.7°F | Wt 244.0 lb

## 2013-07-24 DIAGNOSIS — N390 Urinary tract infection, site not specified: Secondary | ICD-10-CM

## 2013-07-24 DIAGNOSIS — Z3481 Encounter for supervision of other normal pregnancy, first trimester: Secondary | ICD-10-CM

## 2013-07-24 DIAGNOSIS — O0990 Supervision of high risk pregnancy, unspecified, unspecified trimester: Secondary | ICD-10-CM

## 2013-07-24 LAB — POCT URINALYSIS DIPSTICK
Bilirubin, UA: NEGATIVE
Ketones, UA: NEGATIVE
Spec Grav, UA: 1.01

## 2013-07-24 MED ORDER — LEVOFLOXACIN 250 MG PO TABS: 250.0000 mg | ORAL_TABLET | Freq: Every day | ORAL | Status: DC

## 2013-07-24 NOTE — Progress Notes (Signed)
P 109 Patient reports she is doing better with the nausea

## 2013-07-25 ENCOUNTER — Other Ambulatory Visit: Payer: Self-pay | Admitting: Obstetrics

## 2013-07-25 ENCOUNTER — Encounter: Payer: Self-pay | Admitting: Obstetrics

## 2013-07-25 ENCOUNTER — Inpatient Hospital Stay (HOSPITAL_COMMUNITY)
Admission: AD | Admit: 2013-07-25 | Discharge: 2013-07-26 | Disposition: A | Discharge status: Home / Self Care | Payer: Medicaid Other | Source: Ambulatory Visit | Attending: Obstetrics & Gynecology | Admitting: Obstetrics & Gynecology

## 2013-07-25 DIAGNOSIS — O210 Mild hyperemesis gravidarum: Secondary | ICD-10-CM | POA: Insufficient documentation

## 2013-07-25 LAB — URINALYSIS, ROUTINE W REFLEX MICROSCOPIC
Bilirubin Urine: NEGATIVE
Ketones, ur: 15 mg/dL — AB
Nitrite: NEGATIVE
Protein, ur: NEGATIVE mg/dL
Urobilinogen, UA: 1 mg/dL (ref 0.0–1.0)

## 2013-07-25 MED ORDER — DEXTROSE IN LACTATED RINGERS 5 % IV SOLN
INTRAVENOUS | Status: DC
  Administered 2013-07-25: 23:00:00 via INTRAVENOUS

## 2013-07-25 MED ORDER — ONDANSETRON 8 MG/NS 50 ML IVPB
8.0000 mg | Freq: Once | INTRAVENOUS | Status: AC
  Administered 2013-07-25: 8 mg via INTRAVENOUS
  Filled 2013-07-25: qty 8

## 2013-07-25 NOTE — MAU Provider Note (Signed)
History     CSN: [id number]  Arrival date and time: 07/25/13 2146   None     Chief Complaint  Patient presents with  . Emesis During Pregnancy  . Migraine   HPI Samantha Guzman is a 27yo [id number] at 12.2wks who presents for eval of spitting/N/V that began earlier today. She reports having 5 'good days' leading up to today where she only vomited 1-2 times/d. At her prenatal visit this week she told Dr Clearance Coots that she might be in the clear as far as hyperemesis and would forego looking into the Reglan pump as she required with her prev pregnancy. She takes Protonix, Declegis and Reglan regularly, but today she vomited these medications.  OB History   Grav Para Term Preterm Abortions TAB SAB Ect Mult Living   4 1 1  0 2  2   1       Past Medical History  Diagnosis Date  . No significant past medical history   . Vertigo     Past Surgical History  Procedure Laterality Date  . Cesarean section      No family history on file.  History  Substance Use Topics  . Smoking status: Former Smoker    Quit date: 06/06/2009  . Smokeless tobacco: Not on file  . Alcohol Use: No     Comment: occ    Allergies: No Known Allergies  Prescriptions prior to admission  Medication Sig Dispense Refill  . Doxylamine-Pyridoxine (DICLEGIS) 10-10 MG TBEC Take 1 tablet by mouth daily.       Marland Kitchen levofloxacin (LEVAQUIN) 250 MG tablet Take 250 mg by mouth daily. X 3 days. Started on 07/24/2013.      . metoCLOPramide (REGLAN) 10 MG tablet Take 1 tablet (10 mg total) by mouth 3 (three) times daily before meals.  60 tablet  2  . ondansetron (ZOFRAN) 4 MG tablet Take 1 tablet (4 mg total) by mouth every 6 (six) hours as needed for nausea.  20 tablet  0  . pantoprazole (PROTONIX) 40 MG tablet Take 1 tablet (40 mg total) by mouth 2 (two) times daily.  60 tablet  3  . [DISCONTINUED] levofloxacin (LEVAQUIN) 250 MG tablet Take 1 tablet (250 mg total) by mouth daily.  3 tablet  0    ROS Physical Exam   Blood  pressure 129/80, pulse 102, temperature 98.2 F (36.8 C), height 5\' 6"  (1.676 m), weight 108.228 kg (238 lb 9.6 oz), last menstrual period 04/30/2013.  Physical Exam  Constitutional: She is oriented to person, place, and time. She appears well-developed.  HENT:  Head: Normocephalic.  Neck: Normal range of motion.  Cardiovascular: Normal rate.   Respiratory: Effort normal.  GI: Soft.  Musculoskeletal: Normal range of motion.  Neurological: She is alert and oriented to person, place, and time.  Skin: Skin is warm and dry.  Psychiatric: She has a normal mood and affect. Her behavior is normal. Thought content normal.   Urinalysis    Component Value Date/Time   COLORURINE YELLOW 07/25/2013 2158   APPEARANCEUR CLEAR 07/25/2013 2158   LABSPEC 1.020 07/25/2013 2158   PHURINE 6.5 07/25/2013 2158   GLUCOSEU NEGATIVE 07/25/2013 2158   HGBUR NEGATIVE 07/25/2013 2158   BILIRUBINUR NEGATIVE 07/25/2013 2158   BILIRUBINUR NEGATIVE 07/24/2013 0949   KETONESUR 15* 07/25/2013 2158   PROTEINUR NEGATIVE 07/25/2013 2158   UROBILINOGEN 1.0 07/25/2013 2158   UROBILINOGEN negative 07/24/2013 0949   NITRITE NEGATIVE 07/25/2013 2158   NITRITE NEGATIVE 07/24/2013 0949   LEUKOCYTESUR  NEGATIVE 07/25/2013 2158     MAU Course  Procedures  Of note: no weight loss noted; urine with minimal ketones present; decision for 1L D5LR with Zofran 8mg  IVPB- feeling better after infusion.  Assessment and Plan  IUP at 12.2wks Hyperemesis  D/C home  Resume home meds (Protonix, Diclegis, Reglan and Zofran) Instructed to call [facility] on Monday (9/22) to discuss the possibility of a Reglan pump  Samantha Guzman 07/25/2013, 10:59 PM

## 2013-07-25 NOTE — MAU Note (Signed)
Patient is in with c/o n/v and not able to keep anything down today. She also states that she have a new onset headache. Denies vaginal bleeding

## 2013-07-25 NOTE — MAU Note (Signed)
I have hyperemesis. Today unble to keep down anything. I also have a migraine since last night

## 2013-07-27 ENCOUNTER — Telehealth: Payer: Self-pay | Admitting: *Deleted

## 2013-07-27 LAB — CULTURE, OB URINE: Colony Count: >=100000

## 2013-07-27 NOTE — Telephone Encounter (Signed)
Message copied by Glendell Docker on Sun Jul 27, 2013  1:28 PM ------      Message from: Coral Ceo A      Created: Fri Jul 18, 2013  3:57 PM       Vitamin D 1000 mg po daily. ------

## 2013-07-27 NOTE — Telephone Encounter (Signed)
Attempted to contact patient at [phone], person on voice message was not that of patient. No voice message left. OB stick note reminder in place for patient return appointment. Medication updated in patient chart.

## 2013-07-27 NOTE — Telephone Encounter (Signed)
Patient has been treated with Levaquin on 07/24/2013. No furhter action is required

## 2013-07-27 NOTE — Telephone Encounter (Signed)
Message copied by Glendell Docker on Sun Jul 27, 2013 10:59 AM ------      Message from: Coral Ceo A      Created: Fri Jul 04, 2013  8:00 AM       Macrobid 1 po bid x 7 days. ------

## 2013-08-02 ENCOUNTER — Inpatient Hospital Stay (HOSPITAL_COMMUNITY)
Admission: AD | Admit: 2013-08-02 | Discharge: 2013-08-03 | Disposition: A | Discharge status: Home / Self Care | Payer: Medicaid Other | Source: Ambulatory Visit | Attending: Obstetrics | Admitting: Obstetrics

## 2013-08-02 ENCOUNTER — Encounter (HOSPITAL_COMMUNITY): Payer: Self-pay | Admitting: *Deleted

## 2013-08-02 DIAGNOSIS — O210 Mild hyperemesis gravidarum: Secondary | ICD-10-CM | POA: Insufficient documentation

## 2013-08-02 LAB — URINALYSIS, ROUTINE W REFLEX MICROSCOPIC
Glucose, UA: NEGATIVE mg/dL
Hgb urine dipstick: NEGATIVE
Leukocytes, UA: NEGATIVE
Nitrite: NEGATIVE
Specific Gravity, Urine: >1.03 — ABNORMAL HIGH (ref 1.005–1.030)
pH: 6 (ref 5.0–8.0)

## 2013-08-02 LAB — URINE MICROSCOPIC-ADD ON

## 2013-08-02 MED ORDER — FAMOTIDINE IN NACL 20-0.9 MG/50ML-% IV SOLN
20.0000 mg | INTRAVENOUS | Status: AC
  Administered 2013-08-02: 20 mg via INTRAVENOUS
  Filled 2013-08-02: qty 50

## 2013-08-02 MED ORDER — PROMETHAZINE HCL 25 MG RE SUPP: 25.0000 mg | Freq: Four times a day (QID) | RECTAL | Status: DC | PRN

## 2013-08-02 MED ORDER — PROMETHAZINE HCL 25 MG/ML IJ SOLN
25.0000 mg | Freq: Once | INTRAVENOUS | Status: AC
  Administered 2013-08-02: 25 mg via INTRAVENOUS
  Filled 2013-08-02: qty 1

## 2013-08-02 MED ORDER — ONDANSETRON HCL 4 MG/2ML IJ SOLN
4.0000 mg | INTRAMUSCULAR | Status: AC
  Administered 2013-08-02: 4 mg via INTRAVENOUS
  Filled 2013-08-02: qty 2

## 2013-08-02 MED ORDER — LACTATED RINGERS IV SOLN
INTRAVENOUS | Status: DC
  Administered 2013-08-02: 22:00:00 via INTRAVENOUS

## 2013-08-02 NOTE — Progress Notes (Signed)
Chief Complaint: Morning Sickness   First Provider Initiated Contact with Patient 08/02/13 1956  SUBJECTIVE  HPI: Samantha Guzman is a 27 y.o. [id number] at [redacted]w[redacted]d by LMP who presents to maternity admissions reporting nausea with vomiting >9 times in last 24 hours. She is taking Reglan and Diglegis at home but these are not helping. She denies cramping/contractions, LOF, vaginal bleeding, vaginal itching/burning, urinary symptoms, h/a, dizziness, or fever/chills.  Past Medical History   Diagnosis  Date   .  No significant past medical history    .  Vertigo     Past Surgical History   Procedure  Laterality  Date   .  Cesarean section      History    Social History   .  Marital Status:  Single     Spouse Name:  N/A     Number of Children:  N/A   .  Years of Education:  N/A    Occupational History   .  Not on file.    Social History Main Topics   .  Smoking status:  Former Smoker     Quit date:  06/06/2009   .  Smokeless tobacco:  Not on file   .  Alcohol Use:  No      Comment: occ   .  Drug Use:  No   .  Sexual Activity:  Yes     Birth Control/ Protection:  None    Other Topics  Concern   .  Not on file    Social History Narrative   .  No narrative on file    No current facility-administered medications on file prior to encounter.    Current Outpatient Prescriptions on File Prior to Encounter   Medication  Sig  Dispense  Refill   .  cholecalciferol (VITAMIN D) 1000 UNITS tablet  Take 1,000 Units by mouth daily.     .  Doxylamine-Pyridoxine (DICLEGIS) 10-10 MG TBEC  Take 1 tablet by mouth daily.     Marland Kitchen  levofloxacin (LEVAQUIN) 250 MG tablet  Take 250 mg by mouth daily. X 3 days. Started on 07/24/2013.     .  metoCLOPramide (REGLAN) 10 MG tablet  Take 1 tablet (10 mg total) by mouth 3 (three) times daily before meals.  60 tablet  2   .  ondansetron (ZOFRAN) 4 MG tablet  Take 1 tablet (4 mg total) by mouth every 6 (six) hours as needed for nausea.  20 tablet  0   .  pantoprazole  (PROTONIX) 40 MG tablet  Take 1 tablet (40 mg total) by mouth 2 (two) times daily.  60 tablet  3    No Known Allergies  ROS: Pertinent items in HPI  OBJECTIVE  Blood pressure 132/82, pulse 129, temperature 98 F (36.7 C), temperature source Oral, resp. rate 18, height 5\' 6"  (1.676 m), weight 104.781 kg (231 lb), last menstrual period 04/30/2013.  GENERAL: Well-developed, well-nourished female in no acute distress.  HEENT: Normocephalic  HEART: normal rate  RESP: normal effort  ABDOMEN: Soft, non-tender  EXTREMITIES: Nontender, no edema  NEURO: Alert and oriented  SPECULUM EXAM: Deferred  LAB RESULTS  Results for orders placed during the hospital encounter of 08/02/13 (from the past 24 hour(s))   URINALYSIS, ROUTINE W REFLEX MICROSCOPIC Status: Abnormal    Collection Time    08/02/13 6:50 PM   Result  Value  Range    Color, Urine  YELLOW  YELLOW    APPearance  CLEAR  CLEAR    Specific Gravity, Urine  >1.030 (*)  1.005 - 1.030    pH  6.0  5.0 - 8.0    Glucose, UA  NEGATIVE  NEGATIVE mg/dL    Hgb urine dipstick  NEGATIVE  NEGATIVE    Bilirubin Urine  SMALL (*)  NEGATIVE    Ketones, ur  >80 (*)  NEGATIVE mg/dL    Protein, ur  30 (*)  NEGATIVE mg/dL    Urobilinogen, UA  0.2  0.0 - 1.0 mg/dL    Nitrite  NEGATIVE  NEGATIVE    Leukocytes, UA  NEGATIVE  NEGATIVE   URINE MICROSCOPIC-ADD ON Status: Abnormal    Collection Time    08/02/13 6:50 PM   Result  Value  Range    Squamous Epithelial / LPF  FEW (*)  RARE    WBC, UA  3-6  <3 WBC/hpf    RBC / HPF  0-2  <3 RBC/hpf    Bacteria, UA  MANY (*)  RARE    Urine-Other  MUCOUS PRESENT     ASSESSMENT  PLAN  Phenergan 25 mg in D5LR, Zofran 4 mg IV, and Pepcid IV in MAU  Report to [name], [place]  Sharen Counter  Certified Nurse-Midwife  08/02/2013  7:57 PM  2325 Pt reassesses and reports improvement in nausea and desires discharge to home.  Plan: Discharge home RX Phenergan suppositories. Keep scheduled  appointment.  [facility]

## 2013-08-02 NOTE — MAU Provider Note (Signed)
Chief Complaint: Morning Sickness   First Provider Initiated Contact with Patient 08/02/13 1956     SUBJECTIVE HPI: Samantha Guzman is a 27 y.o. [id number] at [redacted]w[redacted]d by LMP who presents to maternity admissions reporting nausea with vomiting >9 times in last 24 hours. She is taking Reglan and Diglegis at home but these are not helping.  She denies cramping/contractions, LOF, vaginal bleeding, vaginal itching/burning, urinary symptoms, h/a, dizziness, or fever/chills.     Past Medical History  Diagnosis Date  . No significant past medical history   . Vertigo    Past Surgical History  Procedure Laterality Date  . Cesarean section     History   Social History  . Marital Status: Single    Spouse Name: N/A    Number of Children: N/A  . Years of Education: N/A   Occupational History  . Not on file.   Social History Main Topics  . Smoking status: Former Smoker    Quit date: 06/06/2009  . Smokeless tobacco: Not on file  . Alcohol Use: No     Comment: occ  . Drug Use: No  . Sexual Activity: Yes    Birth Control/ Protection: None   Other Topics Concern  . Not on file   Social History Narrative  . No narrative on file   No current facility-administered medications on file prior to encounter.   Current Outpatient Prescriptions on File Prior to Encounter  Medication Sig Dispense Refill  . cholecalciferol (VITAMIN D) 1000 UNITS tablet Take 1,000 Units by mouth daily.      . Doxylamine-Pyridoxine (DICLEGIS) 10-10 MG TBEC Take 1 tablet by mouth daily.       Marland Kitchen levofloxacin (LEVAQUIN) 250 MG tablet Take 250 mg by mouth daily. X 3 days. Started on 07/24/2013.      . metoCLOPramide (REGLAN) 10 MG tablet Take 1 tablet (10 mg total) by mouth 3 (three) times daily before meals.  60 tablet  2  . ondansetron (ZOFRAN) 4 MG tablet Take 1 tablet (4 mg total) by mouth every 6 (six) hours as needed for nausea.  20 tablet  0  . pantoprazole (PROTONIX) 40 MG tablet Take 1 tablet (40 mg total) by mouth 2  (two) times daily.  60 tablet  3   No Known Allergies  ROS: Pertinent items in HPI  OBJECTIVE Blood pressure 132/82, pulse 129, temperature 98 F (36.7 C), temperature source Oral, resp. rate 18, height 5\' 6"  (1.676 m), weight 104.781 kg (231 lb), last menstrual period 04/30/2013. GENERAL: Well-developed, well-nourished female in no acute distress.  HEENT: Normocephalic HEART: normal rate RESP: normal effort ABDOMEN: Soft, non-tender EXTREMITIES: Nontender, no edema NEURO: Alert and oriented SPECULUM EXAM: Deferred  LAB RESULTS Results for orders placed during the hospital encounter of 08/02/13 (from the past 24 hour(s))  URINALYSIS, ROUTINE W REFLEX MICROSCOPIC     Status: Abnormal   Collection Time    08/02/13  6:50 PM      Result Value Range   Color, Urine YELLOW  YELLOW   APPearance CLEAR  CLEAR   Specific Gravity, Urine >1.030 (*) 1.005 - 1.030   pH 6.0  5.0 - 8.0   Glucose, UA NEGATIVE  NEGATIVE mg/dL   Hgb urine dipstick NEGATIVE  NEGATIVE   Bilirubin Urine SMALL (*) NEGATIVE   Ketones, ur >80 (*) NEGATIVE mg/dL   Protein, ur 30 (*) NEGATIVE mg/dL   Urobilinogen, UA 0.2  0.0 - 1.0 mg/dL   Nitrite NEGATIVE  NEGATIVE  Leukocytes, UA NEGATIVE  NEGATIVE  URINE MICROSCOPIC-ADD ON     Status: Abnormal   Collection Time    08/02/13  6:50 PM      Result Value Range   Squamous Epithelial / LPF FEW (*) RARE   WBC, UA 3-6  <3 WBC/hpf   RBC / HPF 0-2  <3 RBC/hpf   Bacteria, UA MANY (*) RARE   Urine-Other MUCOUS PRESENT      ASSESSMENT   PLAN Phenergan 25 mg in D5LR, Zofran 4 mg IV, and Pepcid IV in MAU Report to [name], [place]  Sharen Counter Certified Nurse-Midwife 08/02/2013  7:57 PM

## 2013-08-02 NOTE — MAU Note (Signed)
Pt presents with complaints of nausea and vomiting throughout this entire pregnancy. States she has tried taking multiple medications for the nausea and vomiting but nothing seems to help.

## 2013-08-03 NOTE — MAU Note (Signed)
Pt. Is ready for discharge, Pt. Is very drowsy from the phenergan infusion,  pt. Unable to drive herself home and unable to find a ride. Pt. Will stay on unit until she is able to drive herself home.

## 2013-08-04 ENCOUNTER — Encounter: Payer: Self-pay | Admitting: Obstetrics

## 2013-08-04 ENCOUNTER — Ambulatory Visit (INDEPENDENT_AMBULATORY_CARE_PROVIDER_SITE_OTHER): Payer: Medicaid Other | Admitting: Obstetrics

## 2013-08-04 VITALS — BP 109/76 | Temp 98.4°F | Wt 235.8 lb

## 2013-08-04 DIAGNOSIS — O219 Vomiting of pregnancy, unspecified: Secondary | ICD-10-CM

## 2013-08-04 DIAGNOSIS — Z3480 Encounter for supervision of other normal pregnancy, unspecified trimester: Secondary | ICD-10-CM

## 2013-08-04 DIAGNOSIS — O210 Mild hyperemesis gravidarum: Secondary | ICD-10-CM

## 2013-08-04 DIAGNOSIS — O0990 Supervision of high risk pregnancy, unspecified, unspecified trimester: Secondary | ICD-10-CM

## 2013-08-04 DIAGNOSIS — Z3481 Encounter for supervision of other normal pregnancy, first trimester: Secondary | ICD-10-CM

## 2013-08-04 LAB — POCT URINALYSIS DIPSTICK
Blood, UA: NEGATIVE
Glucose, UA: NEGATIVE
Nitrite, UA: NEGATIVE
Spec Grav, UA: 1.02
Urobilinogen, UA: NEGATIVE
pH, UA: 5

## 2013-08-04 LAB — URINE CULTURE

## 2013-08-04 MED ORDER — PROMETHAZINE HCL 25 MG RE SUPP: 25.0000 mg | Freq: Four times a day (QID) | RECTAL | Status: DC | PRN

## 2013-08-04 NOTE — Telephone Encounter (Signed)
Pt advised of vitamin d per Dr Clearance Coots instructions.

## 2013-08-04 NOTE — Progress Notes (Signed)
Pulse: 97

## 2013-08-05 ENCOUNTER — Other Ambulatory Visit: Payer: Self-pay | Admitting: *Deleted

## 2013-08-05 ENCOUNTER — Encounter: Payer: Self-pay | Admitting: Obstetrics

## 2013-08-05 ENCOUNTER — Other Ambulatory Visit (HOSPITAL_COMMUNITY): Payer: Self-pay | Admitting: Obstetrics and Gynecology

## 2013-08-06 ENCOUNTER — Encounter (HOSPITAL_COMMUNITY): Payer: Self-pay | Admitting: *Deleted

## 2013-08-06 ENCOUNTER — Encounter: Payer: Self-pay | Admitting: Obstetrics

## 2013-08-06 ENCOUNTER — Inpatient Hospital Stay (HOSPITAL_COMMUNITY)
Admission: AD | Admit: 2013-08-06 | Discharge: 2013-08-10 | DRG: 781 | Disposition: A | Discharge status: Home / Self Care | Payer: Medicaid Other | Source: Ambulatory Visit | Attending: Obstetrics & Gynecology | Admitting: Obstetrics & Gynecology

## 2013-08-06 DIAGNOSIS — O211 Hyperemesis gravidarum with metabolic disturbance: Secondary | ICD-10-CM

## 2013-08-06 DIAGNOSIS — O3432 Maternal care for cervical incompetence, second trimester: Secondary | ICD-10-CM

## 2013-08-06 DIAGNOSIS — O3430 Maternal care for cervical incompetence, unspecified trimester: Secondary | ICD-10-CM | POA: Diagnosis present

## 2013-08-06 DIAGNOSIS — O219 Vomiting of pregnancy, unspecified: Secondary | ICD-10-CM | POA: Diagnosis present

## 2013-08-06 DIAGNOSIS — O210 Mild hyperemesis gravidarum: Secondary | ICD-10-CM

## 2013-08-06 DIAGNOSIS — E876 Hypokalemia: Secondary | ICD-10-CM | POA: Diagnosis present

## 2013-08-06 LAB — COMPREHENSIVE METABOLIC PANEL
Albumin: 3.2 g/dL — ABNORMAL LOW (ref 3.5–5.2)
BUN: 3 mg/dL — ABNORMAL LOW (ref 6–23)
CO2: 25 mEq/L (ref 19–32)
Chloride: 98 mEq/L (ref 96–112)
Creatinine, Ser: 0.59 mg/dL (ref 0.50–1.10)
GFR calc Af Amer: >90 mL/min (ref >90)
GFR calc non Af Amer: >90 mL/min (ref >90)
Glucose, Bld: 90 mg/dL (ref 70–99)
Potassium: 2.8 mEq/L — ABNORMAL LOW (ref 3.5–5.1)
Total Bilirubin: 0.3 mg/dL (ref 0.3–1.2)
Total Protein: 7.2 g/dL (ref 6.0–8.3)

## 2013-08-06 LAB — CBC
HCT: 33.5 % — ABNORMAL LOW (ref 36.0–46.0)
MCH: 26.9 pg (ref 26.0–34.0)
MCH: 27.8 pg (ref 26.0–34.0)
MCHC: 35.9 g/dL (ref 30.0–36.0)
MCV: 77.5 fL — ABNORMAL LOW (ref 78.0–100.0)
Platelets: 232 10*3/uL (ref 150–400)
RBC: 4.32 MIL/uL (ref 3.87–5.11)
RBC: 3.63 MIL/uL — ABNORMAL LOW (ref 3.87–5.11)
RDW: 13.5 % (ref 11.5–15.5)
RDW: 13.6 % (ref 11.5–15.5)
WBC: 8.8 10*3/uL (ref 4.0–10.5)
WBC: 8.1 10*3/uL (ref 4.0–10.5)

## 2013-08-06 LAB — URINALYSIS, ROUTINE W REFLEX MICROSCOPIC
Glucose, UA: NEGATIVE mg/dL
Hgb urine dipstick: NEGATIVE
Ketones, ur: >80 mg/dL — AB
Leukocytes, UA: NEGATIVE
Nitrite: NEGATIVE
Protein, ur: NEGATIVE mg/dL
Specific Gravity, Urine: >1.03 — ABNORMAL HIGH (ref 1.005–1.030)
Urobilinogen, UA: 0.2 mg/dL (ref 0.0–1.0)
pH: 6.5 (ref 5.0–8.0)

## 2013-08-06 MED ORDER — DEXTROSE IN LACTATED RINGERS 5 % IV SOLN
INTRAVENOUS | Status: DC
  Administered 2013-08-06 (×2): via INTRAVENOUS

## 2013-08-06 MED ORDER — PROMETHAZINE HCL 25 MG/ML IJ SOLN
25.0000 mg | Freq: Once | INTRAVENOUS | Status: AC
  Administered 2013-08-06: 25 mg via INTRAVENOUS
  Filled 2013-08-06: qty 1

## 2013-08-06 MED ORDER — ACETAMINOPHEN 325 MG PO TABS: 650.0000 mg | ORAL_TABLET | ORAL | Status: DC | PRN

## 2013-08-06 MED ORDER — POTASSIUM CHLORIDE 2 MEQ/ML IV SOLN
Freq: Once | INTRAVENOUS | Status: AC
  Administered 2013-08-06: 19:00:00 via INTRAVENOUS
  Filled 2013-08-06: qty 1000

## 2013-08-06 MED ORDER — METOCLOPRAMIDE HCL 10 MG PO TABS
10.0000 mg | ORAL_TABLET | Freq: Three times a day (TID) | ORAL | Status: DC
  Administered 2013-08-07 – 2013-08-10 (×10): 10 mg via ORAL
  Filled 2013-08-06 (×10): qty 1

## 2013-08-06 MED ORDER — ONDANSETRON HCL 4 MG/2ML IJ SOLN
4.0000 mg | Freq: Once | INTRAMUSCULAR | Status: AC
  Administered 2013-08-06: 4 mg via INTRAVENOUS
  Filled 2013-08-06: qty 2

## 2013-08-06 MED ORDER — ONDANSETRON 8 MG PO TBDP
8.0000 mg | ORAL_TABLET | Freq: Three times a day (TID) | ORAL | Status: DC | PRN
  Administered 2013-08-10: 8 mg via ORAL
  Filled 2013-08-06: qty 1

## 2013-08-06 MED ORDER — ZOLPIDEM TARTRATE 5 MG PO TABS
5.0000 mg | ORAL_TABLET | Freq: Every evening | ORAL | Status: DC | PRN
  Administered 2013-08-06 – 2013-08-09 (×4): 5 mg via ORAL
  Filled 2013-08-06 (×4): qty 1

## 2013-08-06 MED ORDER — CALCIUM CARBONATE ANTACID 500 MG PO CHEW: 2.0000 | CHEWABLE_TABLET | ORAL | Status: DC | PRN

## 2013-08-06 MED ORDER — PYRIDOXINE HCL 100 MG/ML IJ SOLN
Freq: Every morning | INTRAVENOUS | Status: DC
  Administered 2013-08-07: 07:00:00 via INTRAVENOUS
  Filled 2013-08-06: qty 1000

## 2013-08-06 MED ORDER — POTASSIUM CHLORIDE 10 MEQ/100ML IV SOLN
10.0000 meq | INTRAVENOUS | Status: AC
  Administered 2013-08-06 – 2013-08-07 (×3): 10 meq via INTRAVENOUS
  Filled 2013-08-06 (×3): qty 100

## 2013-08-06 MED ORDER — DOCUSATE SODIUM 100 MG PO CAPS
100.0000 mg | ORAL_CAPSULE | Freq: Every day | ORAL | Status: DC
  Administered 2013-08-06 – 2013-08-10 (×5): 100 mg via ORAL
  Filled 2013-08-06 (×5): qty 1

## 2013-08-06 MED ORDER — LACTATED RINGERS IV SOLN: INTRAVENOUS | Status: AC

## 2013-08-06 MED ORDER — PANTOPRAZOLE SODIUM 40 MG PO TBEC
40.0000 mg | DELAYED_RELEASE_TABLET | Freq: Two times a day (BID) | ORAL | Status: DC
  Administered 2013-08-06 – 2013-08-10 (×8): 40 mg via ORAL
  Filled 2013-08-06 (×8): qty 1

## 2013-08-06 MED ORDER — POTASSIUM CHLORIDE CRYS ER 20 MEQ PO TBCR
20.0000 meq | EXTENDED_RELEASE_TABLET | Freq: Once | ORAL | Status: AC
  Administered 2013-08-06: 20 meq via ORAL
  Filled 2013-08-06: qty 1

## 2013-08-06 NOTE — H&P (Signed)
Samantha Guzman is a 27 y.o. female presenting with nausea and vomiting. Maternal Medical History:  Reason for admission: Nausea.  She presents with nausea and vomiting.  She has been diagnosed previously with hyperemesis.  Prenatal complications: Cervical insufficiency    OB History   Grav Para Term Preterm Abortions TAB SAB Ect Mult Living   4 1 1  0 2  2   1      Past Medical History  Diagnosis Date  . No significant past medical history   . Vertigo    Past Surgical History  Procedure Laterality Date  . Cesarean section     Family History: family history is not on file. Social History:  reports that she quit smoking about 4 years ago. She does not have any smokeless tobacco history on file. She reports that she does not drink alcohol or use illicit drugs.     Review of Systems  Constitutional: Negative for fever.  Eyes: Negative for blurred vision.  Respiratory: Negative for shortness of breath.   Gastrointestinal: Positive for nausea and vomiting.  Skin: Negative for rash.  Neurological: Negative for headaches.      Blood pressure 125/74, pulse 113, resp. rate 18, height 5\' 6"  (1.676 m), weight 105.416 kg (232 lb 6.4 oz), last menstrual period 04/30/2013. Maternal Exam:  Abdomen: not evaluated.  Introitus: not evaluated.   Cervix: not evaluated.   Fetal Exam Fetal Monitor Review: Mode: hand-held doppler probe.       Physical Exam  Constitutional: She appears well-developed.  HENT:  Head: Normocephalic.  Neck: Neck supple. No thyromegaly present.  Cardiovascular: Normal rate and regular rhythm.   Respiratory: Breath sounds normal.  GI: Soft. Bowel sounds are normal.  Skin: No rash noted.    Prenatal labs: ABO, Rh: O/POS/-- (09/10 1206) Antibody: NEG (09/10 1206) Rubella: 2.64 (09/10 1206) RPR: NON REAC (09/10 1206)  HBsAg: NEGATIVE (09/10 1206)  HIV: NON REACTIVE (09/10 1206)  GBS:    Results for orders placed during the hospital encounter of  08/06/13 (from the past 24 hour(s))  URINALYSIS, ROUTINE W REFLEX MICROSCOPIC     Status: Abnormal   Collection Time    08/06/13  1:00 PM      Result Value Range   Color, Urine YELLOW  YELLOW   APPearance CLEAR  CLEAR   Specific Gravity, Urine >1.030 (*) 1.005 - 1.030   pH 6.5  5.0 - 8.0   Glucose, UA NEGATIVE  NEGATIVE mg/dL   Hgb urine dipstick NEGATIVE  NEGATIVE   Bilirubin Urine SMALL (*) NEGATIVE   Ketones, ur >80 (*) NEGATIVE mg/dL   Protein, ur NEGATIVE  NEGATIVE mg/dL   Urobilinogen, UA 0.2  0.0 - 1.0 mg/dL   Nitrite NEGATIVE  NEGATIVE   Leukocytes, UA NEGATIVE  NEGATIVE  CBC     Status: Abnormal   Collection Time    08/06/13  4:07 PM      Result Value Range   WBC 8.8  4.0 - 10.5 K/uL   RBC 4.32  3.87 - 5.11 MIL/uL   Hemoglobin 11.6 (*) 12.0 - 15.0 g/dL   HCT 16.1 (*) 09.6 - 04.5 %   MCV 77.5 (*) 78.0 - 100.0 fL   MCH 26.9  26.0 - 34.0 pg   MCHC 34.6  30.0 - 36.0 g/dL   RDW 40.9  81.1 - 91.4 %   Platelets 254  150 - 400 K/uL  COMPREHENSIVE METABOLIC PANEL     Status: Abnormal   Collection Time  08/06/13  4:07 PM      Result Value Range   Sodium 135  135 - 145 mEq/L   Potassium 2.8 (*) 3.5 - 5.1 mEq/L   Chloride 98  96 - 112 mEq/L   CO2 25  19 - 32 mEq/L   Glucose, Bld 90  70 - 99 mg/dL   BUN 3 (*) 6 - 23 mg/dL   Creatinine, Ser 1.61  0.50 - 1.10 mg/dL   Calcium 9.1  8.4 - 09.6 mg/dL   Total Protein 7.2  6.0 - 8.3 g/dL   Albumin 3.2 (*) 3.5 - 5.2 g/dL   AST 32  0 - 37 U/L   ALT 59 (*) 0 - 35 U/L   Alkaline Phosphatase 87  39 - 117 U/L   Total Bilirubin 0.3  0.3 - 1.2 mg/dL   GFR calc non Af Amer >90  >90 mL/min   GFR calc Af Amer >90  >90 mL/min   Assessment/Plan: Present on Admission:  . Nausea and vomiting in pregnancy prior to [redacted] weeks gestation . Cervical incompetence, antepartum condition or complication Hypokalemia   Admit Replace K IV hydration For prophylactic cerclage tomorrow JACKSON-MOORE,Chelse Matas A 08/06/2013, 9:17 PM

## 2013-08-06 NOTE — MAU Note (Signed)
Patient says she came in for "hyperemesis". She says she has taken reglan, phenergan, and zofran and they have not worked. She states that "IV medication" is the only thing that helps.

## 2013-08-06 NOTE — MAU Provider Note (Signed)
History     CSN: [id number]  Arrival date and time: 08/06/13 1449   First Provider Initiated Contact with Patient 08/06/13 1550      Chief Complaint  Patient presents with  . Morning Sickness   HPI [name] is a 27 y.o. [phone] at [redacted]w[redacted]d who presents to MAU today with complaint of "hyperemesis." The patient states that she has phenergan, zofran and reglan at home that she takes regularly. She states that she has not been able to keep the medication down x 3 days. She tried a suppository x 1 and had a BM soon after. She denies headache, vaginal bleeding, discharge, diarrhea, fever, UTI symptoms or heartburn. She states occasional LLQ pressure. She is scheduled to get a cerclage placed tomorrow.    OB History   Grav Para Term Preterm Abortions TAB SAB Ect Mult Living   4 1 1  0 2  2   1       Past Medical History  Diagnosis Date  . No significant past medical history   . Vertigo     Past Surgical History  Procedure Laterality Date  . Cesarean section      History reviewed. No pertinent family history.  History  Substance Use Topics  . Smoking status: Former Smoker    Quit date: 06/06/2009  . Smokeless tobacco: Not on file  . Alcohol Use: No     Comment: occ    Allergies: No Known Allergies  Prescriptions prior to admission  Medication Sig Dispense Refill  . Doxylamine-Pyridoxine (DICLEGIS) 10-10 MG TBEC Take 2 tablets by mouth at bedtime.       . metoCLOPramide (REGLAN) 10 MG tablet Take 1 tablet (10 mg total) by mouth 3 (three) times daily before meals.  60 tablet  2  . pantoprazole (PROTONIX) 40 MG tablet Take 1 tablet (40 mg total) by mouth 2 (two) times daily.  60 tablet  3  . promethazine (PHENERGAN) 25 MG suppository Place 1 suppository (25 mg total) rectally every 6 (six) hours as needed for nausea.  12 each  4    Review of Systems  Constitutional: Positive for malaise/fatigue. Negative for fever.  Gastrointestinal: Positive for nausea, vomiting  and abdominal pain. Negative for diarrhea and constipation.  Genitourinary: Negative for dysuria, urgency and frequency.       Neg - vaginal bleeding, discharge  Neurological: Positive for dizziness and weakness. Negative for loss of consciousness.   Physical Exam   Blood pressure 125/74, pulse 113, resp. rate 18, height 5\' 6"  (1.676 m), weight 232 lb 6.4 oz (105.416 kg), last menstrual period 04/30/2013.  Physical Exam  Constitutional: She is oriented to person, place, and time. She appears well-developed and well-nourished. No distress.  HENT:  Head: Normocephalic and atraumatic.  Cardiovascular: Normal rate, regular rhythm and normal heart sounds.   Respiratory: Effort normal and breath sounds normal. No respiratory distress.  GI: Soft. Bowel sounds are normal. She exhibits no distension and no mass. There is no tenderness. There is no rebound and no guarding.  Neurological: She is alert and oriented to person, place, and time.  Skin: Skin is warm and dry. No erythema.  Psychiatric: She has a normal mood and affect.   Results for orders placed during the hospital encounter of 08/06/13 (from the past 24 hour(s))  URINALYSIS, ROUTINE W REFLEX MICROSCOPIC     Status: Abnormal   Collection Time    08/06/13  1:00 PM      Result Value Range  Color, Urine YELLOW  YELLOW   APPearance CLEAR  CLEAR   Specific Gravity, Urine >1.030 (*) 1.005 - 1.030   pH 6.5  5.0 - 8.0   Glucose, UA NEGATIVE  NEGATIVE mg/dL   Hgb urine dipstick NEGATIVE  NEGATIVE   Bilirubin Urine SMALL (*) NEGATIVE   Ketones, ur >80 (*) NEGATIVE mg/dL   Protein, ur NEGATIVE  NEGATIVE mg/dL   Urobilinogen, UA 0.2  0.0 - 1.0 mg/dL   Nitrite NEGATIVE  NEGATIVE   Leukocytes, UA NEGATIVE  NEGATIVE  CBC     Status: Abnormal   Collection Time    08/06/13  4:07 PM      Result Value Range   WBC 8.8  4.0 - 10.5 K/uL   RBC 4.32  3.87 - 5.11 MIL/uL   Hemoglobin 11.6 (*) 12.0 - 15.0 g/dL   HCT 54.0 (*) 98.1 - 19.1 %   MCV  77.5 (*) 78.0 - 100.0 fL   MCH 26.9  26.0 - 34.0 pg   MCHC 34.6  30.0 - 36.0 g/dL   RDW 47.8  29.5 - 62.1 %   Platelets 254  150 - 400 K/uL  COMPREHENSIVE METABOLIC PANEL     Status: Abnormal   Collection Time    08/06/13  4:07 PM      Result Value Range   Sodium 135  135 - 145 mEq/L   Potassium 2.8 (*) 3.5 - 5.1 mEq/L   Chloride 98  96 - 112 mEq/L   CO2 25  19 - 32 mEq/L   Glucose, Bld 90  70 - 99 mg/dL   BUN 3 (*) 6 - 23 mg/dL   Creatinine, Ser 3.08  0.50 - 1.10 mg/dL   Calcium 9.1  8.4 - 65.7 mg/dL   Total Protein 7.2  6.0 - 8.3 g/dL   Albumin 3.2 (*) 3.5 - 5.2 g/dL   AST 32  0 - 37 U/L   ALT 59 (*) 0 - 35 U/L   Alkaline Phosphatase 87  39 - 117 U/L   Total Bilirubin 0.3  0.3 - 1.2 mg/dL   GFR calc non Af Amer >90  >90 mL/min   GFR calc Af Amer >90  >90 mL/min    MAU Course  Procedures None  MDM FHR - 148 bpm UA shows signs of dehydration 1 liter LR with phenergan infusion given 1 liter NS with MV and KCl for hypokalemia given Patient has not had any episodes of emesis and is resting comfortably in MAU now Just prior to discharge, patient reports worsening of nausea and recent emesis 4 mg Zofran given 2000 - Care turned over to [place], CNM  Freddi Starr, PA-C  08/06/2013, 7:39 PM  Assessment and Plan   N/V resolved after Zofran. Pt reports feeling much better, hungry.   Assessment: 1. Hyperemesis gravidarum before end of [redacted] week gestation with electrolyte imbalance    Plan: Admit to Women[facility] Unit per consult w/ Dr. Tamela Oddi due to electrolyte imbalance. Dr. Tamela Oddi to assume care of pt.  Reg diet, then NPO after MN.  [place], CNM 08/06/2013 8:54 PM

## 2013-08-06 NOTE — MAU Note (Signed)
Pt has had n/v for several weeks. Has tried several medications. . Took phen sup this morning without relief.

## 2013-08-06 NOTE — Progress Notes (Signed)
Pt states that only IV antiemetics relieve her N/V

## 2013-08-06 NOTE — Progress Notes (Signed)
Pt has lost 18 pounds in the last month

## 2013-08-07 ENCOUNTER — Encounter (HOSPITAL_COMMUNITY)
Admission: AD | Disposition: A | Discharge status: Home / Self Care | Payer: Self-pay | Source: Ambulatory Visit | Attending: Obstetrics & Gynecology

## 2013-08-07 ENCOUNTER — Ambulatory Visit (HOSPITAL_COMMUNITY): Admission: RE | Admit: 2013-08-07 | Payer: Medicaid Other | Source: Ambulatory Visit | Admitting: Obstetrics

## 2013-08-07 LAB — BASIC METABOLIC PANEL
BUN: 3 mg/dL — ABNORMAL LOW (ref 6–23)
Calcium: 8.3 mg/dL — ABNORMAL LOW (ref 8.4–10.5)
Creatinine, Ser: 0.55 mg/dL (ref 0.50–1.10)
GFR calc Af Amer: >90 mL/min (ref >90)
GFR calc non Af Amer: >90 mL/min (ref >90)
Potassium: 3.2 mEq/L — ABNORMAL LOW (ref 3.5–5.1)

## 2013-08-07 LAB — MAGNESIUM: Magnesium: 1.6 mg/dL (ref 1.5–2.5)

## 2013-08-07 SURGERY — CERCLAGE, CERVIX, VAGINAL APPROACH
Anesthesia: Choice

## 2013-08-07 MED ORDER — PYRIDOXINE HCL 100 MG/ML IJ SOLN: Freq: Every morning | INTRAVENOUS | Status: DC

## 2013-08-07 MED ORDER — PYRIDOXINE HCL 100 MG/ML IJ SOLN
Freq: Every morning | INTRAVENOUS | Status: DC
  Administered 2013-08-07 – 2013-08-09 (×3): via INTRAVENOUS
  Filled 2013-08-07 (×4): qty 1000

## 2013-08-07 MED ORDER — POTASSIUM CHLORIDE CRYS ER 20 MEQ PO TBCR
20.0000 meq | EXTENDED_RELEASE_TABLET | Freq: Two times a day (BID) | ORAL | Status: DC
  Administered 2013-08-07 – 2013-08-10 (×7): 20 meq via ORAL
  Filled 2013-08-07 (×7): qty 1

## 2013-08-07 NOTE — Progress Notes (Signed)
INITIAL NUTRITION ASSESSMENT  DOCUMENTATION CODES Per approved criteria  -Not Applicable   INTERVENTION: NPO, advance to C/L diet, then antenatal regular as tolerated Nutritional supplement of choice Resource, mixed with gingerale or juice  NUTRITION DIAGNOSIS: Inadequate oral intake related to hyperemesis as evidenced by n/v, weight loss.   Goal: Tolerance of po, weight gain  Monitor:  Diet tol  Reason for Assessment: Hyperemesis  27 y.o. female  Admitting Dx: Nausea and vomiting in pregnancy prior to [redacted] weeks gestation  ASSESSMENT: Pt with cyclical nausea and vomiting, of approx 8 weeks duration . Hyperemesis with previous pregnancy. Weight in MAU at 6 - [redacted] weeks pregnant was 250 lbs. 12 Lb overall weight loss, 5 %. Lowest weight past month 231 lbs on 9/27, currently 238 lbs  Height: Ht Readings from Last 1 Encounters:  08/06/13 5\' 6"  (1.676 m)    Weight: Wt Readings from Last 1 Encounters:  08/07/13 238 lb 8 oz (108.183 kg)    Ideal Body Weight: 135 lbs  % Ideal Body Weight: 176%  Wt Readings from Last 10 Encounters:  08/07/13 238 lb 8 oz (108.183 kg)  08/04/13 235 lb 12.8 oz (106.958 kg)  08/02/13 231 lb (104.781 kg)  07/25/13 238 lb 9.6 oz (108.228 kg)  07/24/13 244 lb (110.678 kg)  07/21/13 243 lb 8 oz (110.451 kg)  07/16/13 241 lb 12.8 oz (109.68 kg)  07/09/13 236 lb 3.2 oz (107.14 kg)  07/03/13 250 lb 12 oz (113.739 kg)  06/30/13 240 lb 6.4 oz (109.045 kg)    Usual Body Weight: 250 lbs  % Usual Body Weight: 95%  BMI:  Body mass index is 38.51 kg/(m^2).  Estimated Nutritional Needs: Kcal: 22-2300 Protein: 80-90 g Fluid: 2.5 L  Diet Order: NPO  EDUCATION NEEDS: -No education needs identified at this time   Intake/Output Summary (Last 24 hours) at 08/07/13 0818 Last data filed at 08/07/13 0646  Gross per 24 hour  Intake 1200.89 ml  Output    200 ml  Net 1000.89 ml    Labs:   Recent Labs Lab 08/06/13 1607 08/07/13 0515  NA 135  135  K 2.8* 3.2*  CL 98 102  CO2 25 22  BUN 3* 3*  CREATININE 0.59 0.55  CALCIUM 9.1 8.3*  MG  --  1.6  GLUCOSE 90 95    CBG (last 3)  No results found for this basename: GLUCAP,  in the last 72 hours  Scheduled Meds: . dextrose 5 % and 0.45% NaCl 1,000 mL with pyridOXINE (B-6) 30 mg, multivitamins adult (MVI -12) 10 mL, folic acid 0.6 mg infusion   Intravenous q morning - 10a  . docusate sodium  100 mg Oral Daily  . metoCLOPramide  10 mg Oral TID AC  . pantoprazole  40 mg Oral BID    Continuous Infusions: . lactated ringers      Past Medical History  Diagnosis Date  . No significant past medical history   . Vertigo     Past Surgical History  Procedure Laterality Date  . Cesarean section      Elisabeth Cara M.Odis Luster LDN Neonatal Nutrition Support Specialist Pager [phone]

## 2013-08-07 NOTE — Progress Notes (Signed)
I received a referral from RN for possible under-lying issues that may be affecting patient's health.  Pt was sleeping and asked me to return later.  I will attempt a visit tomorrow.  [employer] Pager, [phone] 12:20 PM   08/07/13 1200  Clinical Encounter Type  Visited With Patient;Patient not available  Visit Type Initial  Referral From Nurse

## 2013-08-07 NOTE — Progress Notes (Signed)
Patient ID: Samantha Guzman, female   DOB: 01/04/1986, 27 y.o.   MRN: [id number] Hospital Day: 2  S: No nausea  O: Blood pressure 96/63, pulse 78, temperature 97.8 F (36.6 C), temperature source Oral, resp. rate 18, height 5\' 6"  (1.676 m), weight 108.183 kg (238 lb 8 oz), last menstrual period 04/30/2013, SpO2 99.00%.   FHT: FHR OK by doppler Results for orders placed during the hospital encounter of 08/06/13 (from the past 48 hour(s))  URINALYSIS, ROUTINE W REFLEX MICROSCOPIC     Status: Abnormal   Collection Time    08/06/13  1:00 PM      Result Value Range   Color, Urine YELLOW  YELLOW   APPearance CLEAR  CLEAR   Specific Gravity, Urine >1.030 (*) 1.005 - 1.030   pH 6.5  5.0 - 8.0   Glucose, UA NEGATIVE  NEGATIVE mg/dL   Hgb urine dipstick NEGATIVE  NEGATIVE   Bilirubin Urine SMALL (*) NEGATIVE   Ketones, ur >80 (*) NEGATIVE mg/dL   Protein, ur NEGATIVE  NEGATIVE mg/dL   Urobilinogen, UA 0.2  0.0 - 1.0 mg/dL   Nitrite NEGATIVE  NEGATIVE   Leukocytes, UA NEGATIVE  NEGATIVE   Comment: MICROSCOPIC NOT DONE ON URINES WITH NEGATIVE PROTEIN, BLOOD, LEUKOCYTES, NITRITE, OR GLUCOSE <1000 mg/dL.  CBC     Status: Abnormal   Collection Time    08/06/13  4:07 PM      Result Value Range   WBC 8.8  4.0 - 10.5 K/uL   RBC 4.32  3.87 - 5.11 MIL/uL   Hemoglobin 11.6 (*) 12.0 - 15.0 g/dL   HCT 40.9 (*) 81.1 - 91.4 %   MCV 77.5 (*) 78.0 - 100.0 fL   MCH 26.9  26.0 - 34.0 pg   MCHC 34.6  30.0 - 36.0 g/dL   RDW 78.2  95.6 - 21.3 %   Platelets 254  150 - 400 K/uL  COMPREHENSIVE METABOLIC PANEL     Status: Abnormal   Collection Time    08/06/13  4:07 PM      Result Value Range   Sodium 135  135 - 145 mEq/L   Potassium 2.8 (*) 3.5 - 5.1 mEq/L   Comment: REPEATED TO VERIFY   Chloride 98  96 - 112 mEq/L   CO2 25  19 - 32 mEq/L   Glucose, Bld 90  70 - 99 mg/dL   BUN 3 (*) 6 - 23 mg/dL   Creatinine, Ser 0.86  0.50 - 1.10 mg/dL   Calcium 9.1  8.4 - 57.8 mg/dL   Total Protein 7.2  6.0 - 8.3 g/dL    Albumin 3.2 (*) 3.5 - 5.2 g/dL   AST 32  0 - 37 U/L   ALT 59 (*) 0 - 35 U/L   Alkaline Phosphatase 87  39 - 117 U/L   Total Bilirubin 0.3  0.3 - 1.2 mg/dL   GFR calc non Af Amer >90  >90 mL/min   GFR calc Af Amer >90  >90 mL/min   Comment: (NOTE)     The eGFR has been calculated using the CKD EPI equation.     This calculation has not been validated in all clinical situations.     eGFR's persistently <90 mL/min signify possible Chronic Kidney     Disease.  CBC     Status: Abnormal   Collection Time    08/06/13 10:55 PM      Result Value Range   WBC 8.1  4.0 -  10.5 K/uL   RBC 3.63 (*) 3.87 - 5.11 MIL/uL   Hemoglobin 10.1 (*) 12.0 - 15.0 g/dL   HCT 16.1 (*) 09.6 - 04.5 %   MCV 77.4 (*) 78.0 - 100.0 fL   MCH 27.8  26.0 - 34.0 pg   MCHC 35.9  30.0 - 36.0 g/dL   RDW 40.9  81.1 - 91.4 %   Platelets 232  150 - 400 K/uL  BASIC METABOLIC PANEL     Status: Abnormal   Collection Time    08/07/13  5:15 AM      Result Value Range   Sodium 135  135 - 145 mEq/L   Potassium 3.2 (*) 3.5 - 5.1 mEq/L   Chloride 102  96 - 112 mEq/L   CO2 22  19 - 32 mEq/L   Glucose, Bld 95  70 - 99 mg/dL   BUN 3 (*) 6 - 23 mg/dL   Creatinine, Ser 7.82  0.50 - 1.10 mg/dL   Calcium 8.3 (*) 8.4 - 10.5 mg/dL   GFR calc non Af Amer >90  >90 mL/min   GFR calc Af Amer >90  >90 mL/min   Comment: (NOTE)     The eGFR has been calculated using the CKD EPI equation.     This calculation has not been validated in all clinical situations.     eGFR's persistently <90 mL/min signify possible Chronic Kidney     Disease.  MAGNESIUM     Status: None   Collection Time    08/07/13  5:15 AM      Result Value Range   Magnesium 1.6  1.5 - 2.5 mg/dL   A/P- 27 y.o. admitted with:  Present on Admission:  . Nausea and vomiting in pregnancy prior to [redacted] weeks gestation . Cervical incompetence, antepartum condition or complication Mild hypokalemia   Pregnancy Complications: see above Preterm labor management: see  above Dating:  [redacted]w[redacted]d Surgery postponed Bland diet/po KCL Follow lytes

## 2013-08-07 NOTE — Progress Notes (Signed)
UR completed 

## 2013-08-08 LAB — BASIC METABOLIC PANEL
BUN: 4 mg/dL — ABNORMAL LOW (ref 6–23)
CO2: 22 mEq/L (ref 19–32)
Calcium: 8.3 mg/dL — ABNORMAL LOW (ref 8.4–10.5)
Creatinine, Ser: 0.56 mg/dL (ref 0.50–1.10)
GFR calc Af Amer: >90 mL/min (ref >90)
Glucose, Bld: 87 mg/dL (ref 70–99)

## 2013-08-08 NOTE — Progress Notes (Signed)
Chaplain visited briefly with pt.  Chaplain assured the pt of chaplain support and to mention to her nurse if she would like another visit from chaplain.     08/08/13 1300  Clinical Encounter Type  Visited With Patient  Visit Type Spiritual support  Referral From Chaplain    Oley Balm Sherrod

## 2013-08-09 ENCOUNTER — Encounter (HOSPITAL_COMMUNITY)
Admission: AD | Disposition: A | Discharge status: Home / Self Care | Payer: Self-pay | Source: Ambulatory Visit | Attending: Obstetrics & Gynecology

## 2013-08-09 ENCOUNTER — Encounter (HOSPITAL_COMMUNITY): Payer: Self-pay | Admitting: Anesthesiology

## 2013-08-09 ENCOUNTER — Inpatient Hospital Stay (HOSPITAL_COMMUNITY): Payer: Medicaid Other | Admitting: Anesthesiology

## 2013-08-09 ENCOUNTER — Encounter (HOSPITAL_COMMUNITY): Payer: Self-pay

## 2013-08-09 DIAGNOSIS — O3430 Maternal care for cervical incompetence, unspecified trimester: Secondary | ICD-10-CM

## 2013-08-09 HISTORY — PX: CERVICAL CERCLAGE: SHX1329

## 2013-08-09 LAB — SURGICAL PCR SCREEN: MRSA, PCR: NEGATIVE

## 2013-08-09 SURGERY — CERCLAGE, CERVIX, VAGINAL APPROACH
Anesthesia: Spinal | Site: Cervix | Wound class: Clean Contaminated

## 2013-08-09 MED ORDER — PHENYLEPHRINE HCL 10 MG/ML IJ SOLN
INTRAMUSCULAR | Status: DC | PRN
  Administered 2013-08-09 (×2): 40 ug via INTRAVENOUS

## 2013-08-09 MED ORDER — CEFAZOLIN SODIUM-DEXTROSE 2-3 GM-% IV SOLR
INTRAVENOUS | Status: DC | PRN
  Administered 2013-08-09: 2 g via INTRAVENOUS

## 2013-08-09 MED ORDER — PHENYLEPHRINE 40 MCG/ML (10ML) SYRINGE FOR IV PUSH (FOR BLOOD PRESSURE SUPPORT)
PREFILLED_SYRINGE | INTRAVENOUS | Status: AC
  Filled 2013-08-09: qty 5

## 2013-08-09 MED ORDER — FENTANYL CITRATE 0.05 MG/ML IJ SOLN
INTRAMUSCULAR | Status: AC
  Filled 2013-08-09: qty 2

## 2013-08-09 MED ORDER — ONDANSETRON HCL 4 MG/2ML IJ SOLN
INTRAMUSCULAR | Status: AC
  Filled 2013-08-09: qty 2

## 2013-08-09 MED ORDER — OXYCODONE-ACETAMINOPHEN 5-325 MG PO TABS
1.0000 | ORAL_TABLET | ORAL | Status: DC | PRN
  Administered 2013-08-09 (×2): 2 via ORAL
  Filled 2013-08-09 (×2): qty 2

## 2013-08-09 MED ORDER — FENTANYL CITRATE 0.05 MG/ML IJ SOLN: 25.0000 ug | INTRAMUSCULAR | Status: DC | PRN

## 2013-08-09 MED ORDER — IBUPROFEN 600 MG PO TABS
600.0000 mg | ORAL_TABLET | Freq: Four times a day (QID) | ORAL | Status: DC
  Administered 2013-08-09 – 2013-08-10 (×3): 600 mg via ORAL
  Filled 2013-08-09 (×2): qty 1

## 2013-08-09 MED ORDER — SODIUM CHLORIDE 0.9 % IJ SOLN
INTRAMUSCULAR | Status: DC | PRN
  Administered 2013-08-09: 13:00:00 via VAGINAL

## 2013-08-09 MED ORDER — EPHEDRINE 5 MG/ML INJ
INTRAVENOUS | Status: AC
  Filled 2013-08-09: qty 10

## 2013-08-09 MED ORDER — SODIUM CHLORIDE 0.9 % IJ SOLN
Freq: Once | INTRAMUSCULAR | Status: DC
  Administered 2013-08-09: 15:00:00 via VAGINAL
  Filled 2013-08-09: qty 1

## 2013-08-09 MED ORDER — ONDANSETRON HCL 4 MG/2ML IJ SOLN
INTRAMUSCULAR | Status: DC | PRN
  Administered 2013-08-09: 4 mg via INTRAVENOUS

## 2013-08-09 MED ORDER — MORPHINE SULFATE 0.5 MG/ML IJ SOLN
INTRAMUSCULAR | Status: AC
  Filled 2013-08-09: qty 10

## 2013-08-09 MED ORDER — LACTATED RINGERS IV SOLN
INTRAVENOUS | Status: DC
  Administered 2013-08-09 – 2013-08-10 (×2): via INTRAVENOUS

## 2013-08-09 MED ORDER — LACTATED RINGERS IV SOLN
INTRAVENOUS | Status: DC | PRN
  Administered 2013-08-09 (×3): via INTRAVENOUS

## 2013-08-09 MED ORDER — EPHEDRINE SULFATE 50 MG/ML IJ SOLN
INTRAMUSCULAR | Status: DC | PRN
  Administered 2013-08-09 (×3): 50 mg via INTRAVENOUS

## 2013-08-09 SURGICAL SUPPLY — 19 items
CATH ROBINSON RED A/P 16FR (CATHETERS) IMPLANT
CLOTH BEACON ORANGE TIMEOUT ST (SAFETY) ×2 IMPLANT
COUNTER NEEDLE 1200 MAGNETIC (NEEDLE) ×2 IMPLANT
GLOVE BIO SURGEON STRL SZ8 (GLOVE) ×4 IMPLANT
GOWN PREVENTION PLUS XLARGE (GOWN DISPOSABLE) ×2 IMPLANT
GOWN STRL REIN XL XLG (GOWN DISPOSABLE) ×4 IMPLANT
NEEDLE MAYO .5 CIRCLE (NEEDLE) ×2 IMPLANT
NEEDLE SPNL 22GX3.5 QUINCKE BK (NEEDLE) IMPLANT
NS IRRIG 1000ML POUR BTL (IV SOLUTION) ×2 IMPLANT
PACK VAGINAL MINOR WOMEN LF (CUSTOM PROCEDURE TRAY) ×2 IMPLANT
PAD OB MATERNITY 4.3X12.25 (PERSONAL CARE ITEMS) ×2 IMPLANT
PAD PREP 24X48 CUFFED NSTRL (MISCELLANEOUS) ×2 IMPLANT
SUT SILK 4 2X60 (SUTURE) ×2 IMPLANT
SYR CONTROL 10ML LL (SYRINGE) IMPLANT
TOWEL OR 17X24 6PK STRL BLUE (TOWEL DISPOSABLE) ×4 IMPLANT
TRAY FOLEY BAG SILVER LF 14FR (CATHETERS) ×2 IMPLANT
TUBING NON-CON 1/4 X 20 CONN (TUBING) IMPLANT
WATER STERILE IRR 1000ML POUR (IV SOLUTION) ×2 IMPLANT
YANKAUER SUCT BULB TIP NO VENT (SUCTIONS) ×2 IMPLANT

## 2013-08-09 NOTE — Anesthesia Procedure Notes (Signed)
Spinal  Patient location during procedure: OR Preanesthetic Checklist Completed: patient identified, site marked, surgical consent, pre-op evaluation, timeout performed, IV checked, risks and benefits discussed and monitors and equipment checked Spinal Block Patient position: sitting Prep: DuraPrep Patient monitoring: heart rate, cardiac monitor, continuous pulse ox and blood pressure Approach: midline Location: L3-4 Injection technique: single-shot Needle Needle type: Sprotte  Needle gauge: 24 G Needle length: 9 cm Assessment Sensory level: T4 Additional Notes 60 Mg 5% Xylo heavy for SAB

## 2013-08-09 NOTE — Anesthesia Preprocedure Evaluation (Addendum)

## 2013-08-09 NOTE — Transfer of Care (Signed)
Immediate Anesthesia Transfer of Care Note  Patient: Samantha Guzman  Procedure(s) Performed: Procedure(s): CERCLAGE CERVICAL (N/A)  Patient Location: PACU  Anesthesia Type:Spinal  Level of Consciousness: awake, alert  and oriented  Airway & Oxygen Therapy: Patient Spontanous Breathing  Post-op Assessment: Report given to PACU RN and Post -op Vital signs reviewed and stable  Post vital signs: Reviewed and stable  Complications: No apparent anesthesia complications

## 2013-08-09 NOTE — Progress Notes (Signed)
Patient ID: Samantha Guzman, female   DOB: Mar 22, 1986, 27 y.o.   MRN: [id number] Hospital Day: 4  S: No complaints.  O: Blood pressure 111/73, pulse 82, temperature 98.6 F (37 C), temperature source Oral, resp. rate 16, height 5\' 6"  (1.676 m), weight 239 lb 12 oz (108.75 kg), last menstrual period 04/30/2013, SpO2 99.00%.   WUJ:WJXBJYNW: 150 bpm Toco: None SVE:   A/P- 27 y.o. admitted with:  Cervical insufficiency and N/V.  Stable.  For cerclage today.  Present on Admission:  . Nausea and vomiting in pregnancy prior to [redacted] weeks gestation . Cervical incompetence, antepartum condition or complication  Pregnancy Complications: Cervical Insufficiency  Preterm labor management: modified bedrest advised Dating:  [redacted]w[redacted]d PNL Needed:  none FWB:  good PTL:  none

## 2013-08-09 NOTE — Anesthesia Postprocedure Evaluation (Signed)
  Anesthesia Post-op Note  Patient: Samantha Guzman  Procedure(s) Performed: Procedure(s): CERCLAGE CERVICAL (N/A)  Patient is awake, responsive, moving her legs, and has signs of resolution of her numbness. Pain and nausea are reasonably well controlled. Vital signs are stable and clinically acceptable. Oxygen saturation is clinically acceptable. There are no apparent anesthetic complications at this time. Patient is ready for discharge.

## 2013-08-10 NOTE — Progress Notes (Signed)
Discharge instructions reviewed with patient.  Patient states understanding of home care, medications, activity, signs/symptoms to report to MD and return MD office visit.  No home equipment needed.  Patient ambulated for discharge with staff in stable condition without incident.

## 2013-08-10 NOTE — Progress Notes (Signed)
Patient ID: Samantha Guzman, female   DOB: 1985/12/23, 27 y.o.   MRN: [id number] Hospital Day: 5  S: No complaints.  O: Blood pressure 119/51, pulse 63, temperature 97.8 F (36.6 C), temperature source Oral, resp. rate 16, height 5\' 6"  (1.676 m), weight 244 lb 12 oz (111.018 kg), last menstrual period 04/30/2013, SpO2 100.00%.   WUJ:WJXBJYNW: 150 bpm Toco: None SVE:   A/P- 27 y.o. admitted with: N/V and H/O cervical insufficiency.  Stable.  S/P cerclage.  Doing well.  Discharge home.  Present on Admission:  . Nausea and vomiting in pregnancy prior to [redacted] weeks gestation . Cervical incompetence, antepartum condition or complication  Pregnancy Complications: Hyperemesis.  Cervical Insufficiency.  Preterm labor management: modified bedrest advised Dating:  [redacted]w[redacted]d PNL Needed:  none FWB:  good PTL:  stable

## 2013-08-10 NOTE — Discharge Summary (Signed)
Physician Discharge Summary  Patient ID: Samantha Guzman MRN: [id number] DOB/AGE: [birth year] 25 y.o.  Admit date: 08/06/2013 Discharge date: 08/10/2013  Admission Diagnoses:  Hyperemesis Gravidarum.  Cervical Insufficiency.  Discharge Diagnoses:  Principal Problem:   Nausea and vomiting in pregnancy prior to [redacted] weeks gestation Active Problems:   Cervical incompetence, antepartum condition or complication   Discharged Condition: good  Hospital Course: Admitted with N/V and dehydration.  Has H/O cervical insufficiency.  Underwent cervical cerclage without complications.  Post op course uncomplicated.  Discharged home in good condition.  Consults: None  Significant Diagnostic Studies: labs: CMET  Treatments: IV hydration, surgery: cerclage and antiemetic therapy.  Discharge Exam: Blood pressure 119/51, pulse 63, temperature 97.8 F (36.6 C), temperature source Oral, resp. rate 16, height 5\' 6"  (1.676 m), weight 244 lb 12 oz (111.018 kg), last menstrual period 04/30/2013, SpO2 100.00%. General appearance: alert and no distress GI: normal findings: soft, non-tender  Disposition: 01-Home or Self Care  Discharge Orders   Future Appointments Provider Department Dept Phone   08/18/2013 11:00 AM Brock Bad, MD [facility] [phone]   Future Orders Complete By Expires   Discharge activity:  As directed    Discharge diet:  No restrictions  As directed    Discharge instructions  As directed    Comments:     Routine   Do not have sex or do anything that might make you have an orgasm  As directed    Notify physician for a general feeling that "something is not right"  As directed    Notify physician for increase or change in vaginal discharge  As directed    Notify physician for intestinal cramps, with or without diarrhea, sometimes described as "gas pain"  As directed    Notify physician for leaking of fluid  As directed    Notify physician for low, dull backache,  unrelieved by heat or Tylenol  As directed    Notify physician for menstrual like cramps  As directed    Notify physician for pelvic pressure  As directed    Notify physician for uterine contractions.  These may be painless and feel like the uterus is tightening or the baby is  "balling up"  As directed    Notify physician for vaginal bleeding  As directed    PRETERM LABOR:  Includes any of the follwing symptoms that occur between 20 - [redacted] weeks gestation.  If these symptoms are not stopped, preterm labor can result in preterm delivery, placing your baby at risk  As directed        Medication List         DICLEGIS 10-10 MG Tbec  Generic drug:  Doxylamine-Pyridoxine  Take 2 tablets by mouth at bedtime.     metoCLOPramide 10 MG tablet  Commonly known as:  REGLAN  Take 1 tablet (10 mg total) by mouth 3 (three) times daily before meals.     pantoprazole 40 MG tablet  Commonly known as:  PROTONIX  Take 1 tablet (40 mg total) by mouth 2 (two) times daily.     promethazine 25 MG suppository  Commonly known as:  PHENERGAN  Place 1 suppository (25 mg total) rectally every 6 (six) hours as needed for nausea.           Follow-up Information   Follow up with [place] A, MD In 2 weeks. (As scheduled)    Specialty:  Obstetrics and Gynecology   Contact information:   [address]  [address] [place] [address] [phone]       Signed: Brock Bad 08/10/2013, 8:41 AM

## 2013-08-10 NOTE — Anesthesia Postprocedure Evaluation (Signed)
  Anesthesia Post-op Note  Patient: Samantha Guzman  Procedure(s) Performed: Procedure(s): CERCLAGE CERVICAL (N/A)  Patient Location: [facility]  Anesthesia Type:Spinal  Level of Consciousness: awake, alert  and oriented  Airway and Oxygen Therapy: Patient Spontanous Breathing  Post-op Pain: mild  Post-op Assessment: Patient's Cardiovascular Status Stable, Respiratory Function Stable, Patent Airway, No signs of Nausea or vomiting, Pain level controlled, No headache, No backache, No residual numbness and No residual motor weakness  Post-op Vital Signs: Reviewed and stable  Complications: No apparent anesthesia complications

## 2013-08-11 ENCOUNTER — Encounter (HOSPITAL_COMMUNITY): Payer: Self-pay | Admitting: Obstetrics

## 2013-08-11 NOTE — Op Note (Signed)
VAGINAL CERCLAGE PLACEMENT  SURGEON: HARPER,CHARLES A  PREOPERATIVE DIAGNOSIS: Singleton/twin intrauterine pregnancy at [redacted]w[redacted]d. History of cervical incompetence.  POSTOPERATIVE DIAGNOSIS: Same  PROCEDURE PERFORMED: McDonald Cerclage Placement  ANESTHESIA: Spinal anesthesia  INTRAVENOUS FLUIDS:  Per Anesthesiology  ESTIMATED BLOOD LOSS: Minimal  URINE OUTPUT:  {URINE AMOUNT/DESCRIPTION:[id number]  FINDINGS:  14  week size uterus.  COMPLICATIONS: None  INDICATIONS FOR THE PROCEDURE HISTORY: Samantha Guzman is a 27 y.o.  [id number] at [redacted]w[redacted]d gestation with a singleton/twin live intrauterine pregnancy. The patient has a history of painless cervical dilation with 1 previous deliveries/ short cervix 0 cm documented on transvaginal ultrasound. The patient had screening for gonorrhea and chlamydia, which was negative.  SURGICAL RISKS: The patient was informed of the risks and benefits of the procedure. Risks included, but were not limited to, rupture of membranes. The patient was also counseled on the maternal risks of bleeding infection, injury to the cervix, vagina, bladder,or surrounding tissues. The patient expressed understanding of the risks Involved. All questions were answered and the patient consented to the procedure.   DESCRIPTION OF THE PROCEDURE: The patient was taken to the operating room where a time out was performed to confirm correct patient and correct procedure.  Spinal anesthesia was placed and found to be adequate.  The patient was then positioned on the operating table in the dorsal lithotomy position with the legs supported using stirrups.  The perineum was then prepped and draped in the usual sterile fashion.  A bimanual exam was performed and the uterus was noted to be 14 weeks size.  The cervix was closed.  A straight catheter was inserted into the bladder and 100 ml of clear yellow urine was obtained.   OPERATIVE TECHNIQUE: A weighted speculum was inserted into  the vagina and the cervix was visualized.  The anterior lip of the cervix was grasped with a ring forceps.  A double number 4 Silk suture was used to take a bite in the body of the cervix at the 12 o'clock position as close as possible to the junction of the rugated vaginal epithelium exiting at 1 o'clock.  Successive bites were taken 12 o'clock, 9 o'clock, 6 o'clock and 3 o'clock.   The pursestring suture was tightened and tied 8 times.  The knot was at the 12 o'clock position.  Adequate hemostasis was noted.  The vagina was irrigated.  All the instruments were removed.  The instrument counts were correct x 2.  The patient was taken to the PACU in stable condition.

## 2013-08-18 ENCOUNTER — Encounter: Payer: Self-pay | Admitting: Obstetrics

## 2013-08-18 ENCOUNTER — Inpatient Hospital Stay (HOSPITAL_COMMUNITY)
Admission: AD | Admit: 2013-08-18 | Discharge: 2013-08-18 | Disposition: A | Discharge status: Home / Self Care | Payer: Medicaid Other | Source: Ambulatory Visit | Attending: Obstetrics & Gynecology | Admitting: Obstetrics & Gynecology

## 2013-08-18 ENCOUNTER — Encounter (HOSPITAL_COMMUNITY): Payer: Self-pay | Admitting: *Deleted

## 2013-08-18 ENCOUNTER — Ambulatory Visit (INDEPENDENT_AMBULATORY_CARE_PROVIDER_SITE_OTHER): Payer: Medicaid Other | Admitting: Obstetrics

## 2013-08-18 VITALS — BP 104/78 | Temp 99.7°F | Wt 230.0 lb

## 2013-08-18 DIAGNOSIS — Z3482 Encounter for supervision of other normal pregnancy, second trimester: Secondary | ICD-10-CM

## 2013-08-18 DIAGNOSIS — O210 Mild hyperemesis gravidarum: Secondary | ICD-10-CM

## 2013-08-18 DIAGNOSIS — Z3480 Encounter for supervision of other normal pregnancy, unspecified trimester: Secondary | ICD-10-CM

## 2013-08-18 LAB — POCT URINALYSIS DIPSTICK
Bilirubin, UA: NEGATIVE
Glucose, UA: NEGATIVE
Nitrite, UA: NEGATIVE
Urobilinogen, UA: NEGATIVE

## 2013-08-18 MED ORDER — ONDANSETRON HCL 4 MG/2ML IJ SOLN
4.0000 mg | Freq: Once | INTRAMUSCULAR | Status: AC
  Administered 2013-08-18: 4 mg via INTRAVENOUS
  Filled 2013-08-18: qty 2

## 2013-08-18 MED ORDER — DEXAMETHASONE SODIUM PHOSPHATE 10 MG/ML IJ SOLN
10.0000 mg | Freq: Once | INTRAMUSCULAR | Status: AC
  Administered 2013-08-18: 10 mg via INTRAVENOUS
  Filled 2013-08-18: qty 1

## 2013-08-18 MED ORDER — PYRIDOXINE HCL 100 MG/ML IJ SOLN
100.0000 mg | Freq: Once | INTRAMUSCULAR | Status: AC
  Administered 2013-08-18: 100 mg via INTRAVENOUS
  Filled 2013-08-18: qty 1

## 2013-08-18 MED ORDER — METOCLOPRAMIDE HCL 5 MG/ML IJ SOLN
10.0000 mg | Freq: Once | INTRAMUSCULAR | Status: AC
  Administered 2013-08-18: 10 mg via INTRAVENOUS
  Filled 2013-08-18: qty 2

## 2013-08-18 MED ORDER — LACTATED RINGERS IV SOLN
INTRAVENOUS | Status: DC
  Administered 2013-08-18: 19:00:00 via INTRAVENOUS

## 2013-08-18 NOTE — MAU Note (Addendum)
Severe vomiting, through the entire pregnancy. Cerclage placed Sat, Oct 4.

## 2013-08-18 NOTE — MAU Note (Signed)
Patient presents with complaint of hyperemesis x 1 month.

## 2013-08-18 NOTE — MAU Note (Signed)
Patient given Zofran 4mg  IVP at 1939; Reglan 10mg  IVP at 1941; Decadron 10mg  IVP at 1944 and B-6 100mg  IVP at 1946 as ordered. All meds verified with Elpidio Eric, RN BSN prior to administration due to inability to scan meds secondary to a computer shutdown.

## 2013-08-18 NOTE — Progress Notes (Signed)
Pt states she is having severe nausea and vomiting. Pt states the medications she is taking are not helping.

## 2013-08-18 NOTE — MAU Provider Note (Signed)
History     CSN: [id number]  Arrival date and time: 08/18/13 1759   First Provider Initiated Contact with Patient 08/18/13 1857      Chief Complaint  Patient presents with  . Morning Sickness   HPI This is a 27 y.o. female at [redacted]w[redacted]d who presents with c/o nausea and vomiting. Has not taken ANY antiemetics today. Only used "yeast medicine" today. States she only takes the four nausea medicines "when I am stable".  States has not kept anything down for 3 days.   This is the 9th visit to MAU for hyperemesis.  OB History   Grav Para Term Preterm Abortions TAB SAB Ect Mult Living   4 1 1  0 2  2   1       Past Medical History  Diagnosis Date  . No significant past medical history   . Vertigo     Past Surgical History  Procedure Laterality Date  . Cesarean section    . Cervical cerclage N/A 08/09/2013    Procedure: CERCLAGE CERVICAL;  Surgeon: Brock Bad, MD;  Location: [facility];  Service: Gynecology;  Laterality: N/A;    No family history on file.  History  Substance Use Topics  . Smoking status: Former Smoker    Quit date: 06/06/2009  . Smokeless tobacco: Not on file  . Alcohol Use: No     Comment: occ    Allergies: No Known Allergies  Prescriptions prior to admission  Medication Sig Dispense Refill  . CREAM BASE EX Place 1 application vaginally once.      . Doxylamine-Pyridoxine (DICLEGIS) 10-10 MG TBEC Take 2 tablets by mouth at bedtime.       . metoCLOPramide (REGLAN) 10 MG tablet Take 1 tablet (10 mg total) by mouth 3 (three) times daily before meals.  60 tablet  2  . pantoprazole (PROTONIX) 40 MG tablet Take 1 tablet (40 mg total) by mouth 2 (two) times daily.  60 tablet  3  . promethazine (PHENERGAN) 25 MG suppository Place 1 suppository (25 mg total) rectally every 6 (six) hours as needed for nausea.  12 each  4    Review of Systems  Constitutional: Positive for malaise/fatigue. Negative for fever.  Gastrointestinal: Positive for nausea and vomiting.  Negative for abdominal pain.  Genitourinary: Negative for dysuria.  Musculoskeletal: Negative for myalgias.  Neurological: Positive for dizziness and weakness.   Physical Exam   Blood pressure 92/45, pulse 121, temperature 98.9 F (37.2 C), temperature source Oral, resp. rate 18, height 5' 6.5" (1.689 m), weight 102.967 kg (227 lb), last menstrual period 04/30/2013.  Physical Exam  Constitutional: She is oriented to person, place, and time. She appears well-developed and well-nourished. No distress (playing with phone, picks up emesis bag and spits into it).  Cardiovascular: Normal rate.   Respiratory: Effort normal.  GI: Soft. There is no tenderness.  Musculoskeletal: Normal range of motion.  Neurological: She is alert and oriented to person, place, and time.  Skin: Skin is warm and dry.  Psychiatric: She has a normal mood and affect.    MAU Course  Procedures  MDM IV hydration and antiemetics to include Decadron 2015: Assumed care from Wynelle Bourgeois. Pt declines more IVF and requests discharge Retaining water Assessment and Plan  G4P1021 at [redacted]w[redacted]d, hyperemesis Continue current meds and diet per AVS Follow-up Information   Follow up with Roseanna Rainbow, MD. (Keep scheduled appointment. Call office this week if not improving)    Specialty:  Obstetrics and Gynecology  Contact information:   [address] [place] [address] [phone]      Wynelle Bourgeois 08/18/2013, 7:06 PM

## 2013-08-29 ENCOUNTER — Encounter (HOSPITAL_COMMUNITY): Payer: Self-pay | Admitting: *Deleted

## 2013-08-29 ENCOUNTER — Inpatient Hospital Stay (HOSPITAL_COMMUNITY)
Admission: AD | Admit: 2013-08-29 | Discharge: 2013-08-29 | Disposition: A | Discharge status: Home / Self Care | Payer: Medicaid Other | Source: Ambulatory Visit | Attending: Obstetrics | Admitting: Obstetrics

## 2013-08-29 DIAGNOSIS — O210 Mild hyperemesis gravidarum: Secondary | ICD-10-CM | POA: Insufficient documentation

## 2013-08-29 DIAGNOSIS — O26879 Cervical shortening, unspecified trimester: Secondary | ICD-10-CM | POA: Insufficient documentation

## 2013-08-29 DIAGNOSIS — O219 Vomiting of pregnancy, unspecified: Secondary | ICD-10-CM

## 2013-08-29 LAB — COMPREHENSIVE METABOLIC PANEL
AST: 15 U/L (ref 0–37)
Albumin: 2.8 g/dL — ABNORMAL LOW (ref 3.5–5.2)
Alkaline Phosphatase: 91 U/L (ref 39–117)
BUN: 4 mg/dL — ABNORMAL LOW (ref 6–23)
Calcium: 9 mg/dL (ref 8.4–10.5)
Chloride: 102 mEq/L (ref 96–112)
Creatinine, Ser: 0.6 mg/dL (ref 0.50–1.10)
Potassium: 4.1 mEq/L (ref 3.5–5.1)
Sodium: 136 mEq/L (ref 135–145)
Total Bilirubin: 0.2 mg/dL — ABNORMAL LOW (ref 0.3–1.2)
Total Protein: 6.3 g/dL (ref 6.0–8.3)

## 2013-08-29 LAB — CBC
HCT: 29.8 % — ABNORMAL LOW (ref 36.0–46.0)
MCH: 27.7 pg (ref 26.0–34.0)
MCHC: 34.9 g/dL (ref 30.0–36.0)
Platelets: 234 10*3/uL (ref 150–400)
RDW: 13.8 % (ref 11.5–15.5)
WBC: 10.4 10*3/uL (ref 4.0–10.5)

## 2013-08-29 LAB — URINE MICROSCOPIC-ADD ON

## 2013-08-29 LAB — URINALYSIS, ROUTINE W REFLEX MICROSCOPIC
Hgb urine dipstick: NEGATIVE
Ketones, ur: 40 mg/dL — AB
Nitrite: NEGATIVE
Protein, ur: NEGATIVE mg/dL
Urobilinogen, UA: 0.2 mg/dL (ref 0.0–1.0)

## 2013-08-29 MED ORDER — GLYCOPYRROLATE 1 MG PO TABS: 1.0000 mg | ORAL_TABLET | Freq: Three times a day (TID) | ORAL | Status: DC

## 2013-08-29 MED ORDER — PROMETHAZINE HCL 25 MG/ML IJ SOLN
25.0000 mg | Freq: Once | INTRAVENOUS | Status: AC
  Administered 2013-08-29: 25 mg via INTRAVENOUS
  Filled 2013-08-29: qty 1

## 2013-08-29 MED ORDER — GLYCOPYRROLATE 1 MG PO TABS
1.0000 mg | ORAL_TABLET | Freq: Once | ORAL | Status: AC
  Administered 2013-08-29: 1 mg via ORAL
  Filled 2013-08-29: qty 1

## 2013-08-29 MED ORDER — ONDANSETRON 8 MG PO TBDP: 8.0000 mg | ORAL_TABLET | Freq: Three times a day (TID) | ORAL | Status: DC | PRN

## 2013-08-29 MED ORDER — LACTATED RINGERS IV BOLUS (SEPSIS)
1000.0000 mL | Freq: Once | INTRAVENOUS | Status: AC
  Administered 2013-08-29: 1000 mL via INTRAVENOUS

## 2013-08-29 MED ORDER — BUTALBITAL-APAP-CAFFEINE 50-325-40 MG PO TABS
2.0000 | ORAL_TABLET | Freq: Once | ORAL | Status: AC
  Administered 2013-08-29: 2 via ORAL
  Filled 2013-08-29: qty 2

## 2013-08-29 NOTE — MAU Note (Addendum)
Pt stated she has had N/V since being pregnant. Has tried several medications without much relief. Took promethazine this morning and it came back up. Pt had cerclage placed 2 weeks ago. C/o some cramping today. Pt is on bedrest at home.

## 2013-08-29 NOTE — MAU Provider Note (Signed)
History     CSN: [id number]  Arrival date and time: 08/29/13 1558   None     Chief Complaint  Patient presents with  . Morning Sickness   HPI  Samantha Guzman is a 27 y.o. [id number] at [redacted]w[redacted]d who presents today with nausea and vomiting. She was here on 10/6 for 5 days. During that time she also had a cerclage placed due to shortened cervix. She states that she was supposed to have home health set-up for a reglan pump and IV therapy at home. She states that this has not happened yet. She states that she is vomiting "all the time", and that any time she even swallows her saliva she will vomit. She is having to spit into a container all day. She denies any pain, cramping, bleeding or LOF and confirms fetal movement at this time.   Past Medical History  Diagnosis Date  . No significant past medical history   . Vertigo     Past Surgical History  Procedure Laterality Date  . Cesarean section    . Cervical cerclage N/A 08/09/2013    Procedure: CERCLAGE CERVICAL;  Surgeon: Brock Bad, MD;  Location: [facility];  Service: Gynecology;  Laterality: N/A;    No family history on file.  History  Substance Use Topics  . Smoking status: Former Smoker    Quit date: 06/06/2009  . Smokeless tobacco: Not on file  . Alcohol Use: No     Comment: occ    Allergies: No Known Allergies  Prescriptions prior to admission  Medication Sig Dispense Refill  . metoCLOPramide (REGLAN) 10 MG tablet Take 1 tablet (10 mg total) by mouth 3 (three) times daily before meals.  60 tablet  2  . pantoprazole (PROTONIX) 40 MG tablet Take 1 tablet (40 mg total) by mouth 2 (two) times daily.  60 tablet  3  . promethazine (PHENERGAN) 25 MG suppository Place 1 suppository (25 mg total) rectally every 6 (six) hours as needed for nausea.  12 each  4  . promethazine (PHENERGAN) 25 MG tablet Take 25 mg by mouth every 6 (six) hours as needed for nausea.      . [DISCONTINUED] CREAM BASE EX Place 1 application vaginally  once.        ROS Physical Exam   Blood pressure 138/73, pulse 88, temperature 98.1 F (36.7 C), resp. rate 18, last menstrual period 04/30/2013, SpO2 100.00%.  Physical Exam  Constitutional: She is oriented to person, place, and time. She appears well-developed and well-nourished.  tearful  Cardiovascular: Normal rate.   Respiratory: Effort normal.  GI: Soft.  Neurological: She is alert and oriented to person, place, and time.  Skin: Skin is warm and dry.  Psychiatric: She has a normal mood and affect.    MAU Course  Procedures  Results for orders placed during the hospital encounter of 08/29/13 (from the past 24 hour(s))  URINALYSIS, ROUTINE W REFLEX MICROSCOPIC     Status: Abnormal   Collection Time    08/29/13  4:40 PM      Result Value Range   Color, Urine YELLOW  YELLOW   APPearance CLEAR  CLEAR   Specific Gravity, Urine >1.030 (*) 1.005 - 1.030   pH 6.5  5.0 - 8.0   Glucose, UA NEGATIVE  NEGATIVE mg/dL   Hgb urine dipstick NEGATIVE  NEGATIVE   Bilirubin Urine NEGATIVE  NEGATIVE   Ketones, ur 40 (*) NEGATIVE mg/dL   Protein, ur NEGATIVE  NEGATIVE mg/dL  Urobilinogen, UA 0.2  0.0 - 1.0 mg/dL   Nitrite NEGATIVE  NEGATIVE   Leukocytes, UA TRACE (*) NEGATIVE  URINE MICROSCOPIC-ADD ON     Status: Abnormal   Collection Time    08/29/13  4:40 PM      Result Value Range   Squamous Epithelial / LPF MANY (*) RARE   WBC, UA 3-6  <3 WBC/hpf   RBC / HPF 0-2  <3 RBC/hpf   Bacteria, UA MANY (*) RARE   Urine-Other MUCOUS PRESENT    CBC     Status: Abnormal   Collection Time    08/29/13  5:15 PM      Result Value Range   WBC 10.4  4.0 - 10.5 K/uL   RBC 3.75 (*) 3.87 - 5.11 MIL/uL   Hemoglobin 10.4 (*) 12.0 - 15.0 g/dL   HCT 16.1 (*) 09.6 - 04.5 %   MCV 79.5  78.0 - 100.0 fL   MCH 27.7  26.0 - 34.0 pg   MCHC 34.9  30.0 - 36.0 g/dL   RDW 40.9  81.1 - 91.4 %   Platelets 234  150 - 400 K/uL  COMPREHENSIVE METABOLIC PANEL     Status: Abnormal   Collection Time     08/29/13  5:15 PM      Result Value Range   Sodium 136  135 - 145 mEq/L   Potassium 4.1  3.5 - 5.1 mEq/L   Chloride 102  96 - 112 mEq/L   CO2 24  19 - 32 mEq/L   Glucose, Bld 88  70 - 99 mg/dL   BUN 4 (*) 6 - 23 mg/dL   Creatinine, Ser 7.82  0.50 - 1.10 mg/dL   Calcium 9.0  8.4 - 95.6 mg/dL   Total Protein 6.3  6.0 - 8.3 g/dL   Albumin 2.8 (*) 3.5 - 5.2 g/dL   AST 15  0 - 37 U/L   ALT 23  0 - 35 U/L   Alkaline Phosphatase 91  39 - 117 U/L   Total Bilirubin 0.2 (*) 0.3 - 1.2 mg/dL   GFR calc non Af Amer >90  >90 mL/min   GFR calc Af Amer >90  >90 mL/min    1918: C/W Dr. Gaynell Face, will continue home meds as prescribed, and use phenergan suppository as needed. Does not need any evaluation for cervical length at this time 2/2 no complaints related to contractions or pain. 1933: No vomiting since arriving in MAU. Has been able to keep down PO meds and fluid while here.   Assessment and Plan   1. Nausea and vomiting in pregnancy prior to [redacted] weeks gestation    Plan to keep FU for placement of reglan pump Phenergan suppository PRN Robinul for excessive salivation  Return to MAU as needed   Tawnya Crook 08/29/2013, 5:31 PM

## 2013-08-30 LAB — URINE CULTURE

## 2013-09-02 ENCOUNTER — Encounter: Payer: Medicaid Other | Admitting: Obstetrics

## 2013-09-09 ENCOUNTER — Ambulatory Visit (INDEPENDENT_AMBULATORY_CARE_PROVIDER_SITE_OTHER): Payer: Medicaid Other | Admitting: Obstetrics

## 2013-09-09 ENCOUNTER — Other Ambulatory Visit: Payer: Self-pay | Admitting: Obstetrics

## 2013-09-09 ENCOUNTER — Encounter: Payer: Self-pay | Admitting: Obstetrics

## 2013-09-09 VITALS — BP 130/82 | Temp 98.4°F | Wt 248.0 lb

## 2013-09-09 DIAGNOSIS — Z3480 Encounter for supervision of other normal pregnancy, unspecified trimester: Secondary | ICD-10-CM

## 2013-09-09 DIAGNOSIS — O3430 Maternal care for cervical incompetence, unspecified trimester: Secondary | ICD-10-CM

## 2013-09-09 DIAGNOSIS — Z3482 Encounter for supervision of other normal pregnancy, second trimester: Secondary | ICD-10-CM

## 2013-09-09 DIAGNOSIS — O3432 Maternal care for cervical incompetence, second trimester: Secondary | ICD-10-CM

## 2013-09-09 DIAGNOSIS — O0990 Supervision of high risk pregnancy, unspecified, unspecified trimester: Secondary | ICD-10-CM

## 2013-09-09 LAB — POCT URINALYSIS DIPSTICK
Blood, UA: NEGATIVE
Ketones, UA: NEGATIVE
Spec Grav, UA: 1.015
Urobilinogen, UA: NEGATIVE

## 2013-09-09 NOTE — Progress Notes (Signed)
HR - 101 Pt in office for routine OB visit, states she is having pelvic pain and pressure.

## 2013-09-10 ENCOUNTER — Other Ambulatory Visit: Payer: Self-pay | Admitting: Obstetrics

## 2013-09-10 ENCOUNTER — Ambulatory Visit (HOSPITAL_COMMUNITY): Admission: RE | Admit: 2013-09-10 | Payer: Medicaid Other | Source: Ambulatory Visit

## 2013-09-10 ENCOUNTER — Ambulatory Visit (HOSPITAL_COMMUNITY)
Admission: RE | Admit: 2013-09-10 | Discharge: 2013-09-10 | Disposition: A | Discharge status: Home / Self Care | Payer: Medicaid Other | Source: Ambulatory Visit | Attending: Obstetrics | Admitting: Obstetrics

## 2013-09-10 ENCOUNTER — Encounter: Payer: Self-pay | Admitting: Obstetrics

## 2013-09-10 VITALS — BP 115/72 | HR 115 | Wt 246.0 lb

## 2013-09-10 DIAGNOSIS — O210 Mild hyperemesis gravidarum: Secondary | ICD-10-CM

## 2013-09-10 DIAGNOSIS — O09299 Supervision of pregnancy with other poor reproductive or obstetric history, unspecified trimester: Secondary | ICD-10-CM | POA: Insufficient documentation

## 2013-09-10 DIAGNOSIS — O3430 Maternal care for cervical incompetence, unspecified trimester: Secondary | ICD-10-CM | POA: Insufficient documentation

## 2013-09-10 DIAGNOSIS — O34219 Maternal care for unspecified type scar from previous cesarean delivery: Secondary | ICD-10-CM | POA: Insufficient documentation

## 2013-09-12 ENCOUNTER — Other Ambulatory Visit: Payer: Self-pay | Admitting: Obstetrics

## 2013-09-12 DIAGNOSIS — Z0489 Encounter for examination and observation for other specified reasons: Secondary | ICD-10-CM

## 2013-09-15 ENCOUNTER — Ambulatory Visit (HOSPITAL_COMMUNITY)
Admission: RE | Admit: 2013-09-15 | Discharge: 2013-09-15 | Disposition: A | Discharge status: Home / Self Care | Payer: Medicaid Other | Source: Ambulatory Visit | Attending: Obstetrics | Admitting: Obstetrics

## 2013-09-15 VITALS — BP 110/64 | HR 102 | Wt 247.0 lb

## 2013-09-15 DIAGNOSIS — Z363 Encounter for antenatal screening for malformations: Secondary | ICD-10-CM | POA: Insufficient documentation

## 2013-09-15 DIAGNOSIS — O210 Mild hyperemesis gravidarum: Secondary | ICD-10-CM

## 2013-09-15 DIAGNOSIS — Z0489 Encounter for examination and observation for other specified reasons: Secondary | ICD-10-CM

## 2013-09-15 DIAGNOSIS — Z1389 Encounter for screening for other disorder: Secondary | ICD-10-CM | POA: Insufficient documentation

## 2013-09-15 DIAGNOSIS — O09299 Supervision of pregnancy with other poor reproductive or obstetric history, unspecified trimester: Secondary | ICD-10-CM | POA: Insufficient documentation

## 2013-09-15 DIAGNOSIS — O34219 Maternal care for unspecified type scar from previous cesarean delivery: Secondary | ICD-10-CM | POA: Insufficient documentation

## 2013-09-15 DIAGNOSIS — O3432 Maternal care for cervical incompetence, second trimester: Secondary | ICD-10-CM

## 2013-09-15 DIAGNOSIS — O3430 Maternal care for cervical incompetence, unspecified trimester: Secondary | ICD-10-CM | POA: Insufficient documentation

## 2013-09-15 DIAGNOSIS — IMO0002 Reserved for concepts with insufficient information to code with codable children: Secondary | ICD-10-CM

## 2013-09-15 DIAGNOSIS — O358XX0 Maternal care for other (suspected) fetal abnormality and damage, not applicable or unspecified: Secondary | ICD-10-CM | POA: Insufficient documentation

## 2013-09-15 NOTE — Progress Notes (Signed)
Samantha Guzman  was seen today for an ultrasound appointment.  See full report in [facility].  Comments: An echogenic focus was seen in the left cardiac ventricle.  This is felt to represent a calcified papillary muscle, and is not associated with structural or functional cardiac abnormalities.  Although an echogenic cardiac focus may be associated with an increased risk of Down syndrome, this risk is felt to be minimal, especially when it is seen as an isolated finding.    Impression: Single IUP at 19 5/7 weeks Suspected cervical insufficienty s/p cerclage Low risk first trimester aneuploidy screen Echogenic intracardiac focus noted (see comments) Otherwise normal anatomy. Somewhat limited views of the fetal spine obtained due to fetal position No other markers associated with aneuploidy noted Normal amniotic fluid volume  TVUS - cervical length 3.6 cm without funneling or dynamic changes.  Cerclage stitch appears intact  Recommendations: Recommend follow-up ultrasound examination in 2 weeks for cervical length - reeval fetal spine at that time.  Alpha Gula, MD

## 2013-09-16 NOTE — Addendum Note (Signed)
Encounter addended by: Alessandra Bevels. Chase Picket, RN on: 09/16/2013 11:38 AM<BR>     Documentation filed: Charges VN

## 2013-09-20 ENCOUNTER — Inpatient Hospital Stay (HOSPITAL_COMMUNITY)
Admission: AD | Admit: 2013-09-20 | Discharge: 2013-09-21 | Disposition: A | Discharge status: Home / Self Care | Payer: Medicaid Other | Source: Ambulatory Visit | Attending: Obstetrics | Admitting: Obstetrics

## 2013-09-20 DIAGNOSIS — O3430 Maternal care for cervical incompetence, unspecified trimester: Secondary | ICD-10-CM | POA: Insufficient documentation

## 2013-09-20 DIAGNOSIS — O2390 Unspecified genitourinary tract infection in pregnancy, unspecified trimester: Secondary | ICD-10-CM | POA: Insufficient documentation

## 2013-09-20 DIAGNOSIS — Z87891 Personal history of nicotine dependence: Secondary | ICD-10-CM | POA: Insufficient documentation

## 2013-09-20 DIAGNOSIS — N760 Acute vaginitis: Secondary | ICD-10-CM

## 2013-09-21 ENCOUNTER — Encounter (HOSPITAL_COMMUNITY): Payer: Self-pay | Admitting: *Deleted

## 2013-09-21 DIAGNOSIS — N760 Acute vaginitis: Secondary | ICD-10-CM

## 2013-09-21 LAB — URINE MICROSCOPIC-ADD ON

## 2013-09-21 LAB — URINALYSIS, ROUTINE W REFLEX MICROSCOPIC
Bilirubin Urine: NEGATIVE
Glucose, UA: NEGATIVE mg/dL
Ketones, ur: NEGATIVE mg/dL
Nitrite: NEGATIVE
Urobilinogen, UA: 0.2 mg/dL (ref 0.0–1.0)

## 2013-09-21 LAB — WET PREP, GENITAL
Clue Cells Wet Prep HPF POC: NONE SEEN
Yeast Wet Prep HPF POC: NONE SEEN

## 2013-09-21 MED ORDER — FLUCONAZOLE 150 MG PO TABS
150.0000 mg | ORAL_TABLET | Freq: Once | ORAL | Status: AC
  Administered 2013-09-21: 150 mg via ORAL
  Filled 2013-09-21: qty 1

## 2013-09-21 NOTE — MAU Provider Note (Signed)
History     CSN: [id number]  Arrival date and time: 09/20/13 2323   First Provider Initiated Contact with Patient 09/21/13 0016      Chief Complaint  Patient presents with  . Rupture of Membranes   HPI  Pt is a 27 yo G4P1021 at [redacted]w[redacted]d wks IUP here with report of "feeling wetness" around 2300 last night.  No report of contractions or abdominal pain or vaginal bleeding.  Reports vaginal itching.  History of cervical incompetency with last two pregnancies ending at 20 wks, with last being a failed cerclage.  Cerclage placed this pregnancy on October 18th.     Past Medical History  Diagnosis Date  . No significant past medical history   . Vertigo     Past Surgical History  Procedure Laterality Date  . Cesarean section    . Cervical cerclage N/A 08/09/2013    Procedure: CERCLAGE CERVICAL;  Surgeon: Brock Bad, MD;  Location: [facility];  Service: Gynecology;  Laterality: N/A;    History reviewed. No pertinent family history.  History  Substance Use Topics  . Smoking status: Former Smoker    Quit date: 06/06/2009  . Smokeless tobacco: Not on file  . Alcohol Use: No     Comment: occ    Allergies: No Known Allergies  Prescriptions prior to admission  Medication Sig Dispense Refill  . metoCLOPramide (REGLAN) 10 MG tablet Take 1 tablet (10 mg total) by mouth 3 (three) times daily before meals.  60 tablet  2  . ondansetron (ZOFRAN-ODT) 8 MG disintegrating tablet Take 1 tablet (8 mg total) by mouth every 8 (eight) hours as needed.  20 tablet  0  . Pantoprazole Sodium (PROTONIX PO) Take by mouth.        Review of Systems  Genitourinary:       Vaginal discharge  All other systems reviewed and are negative.   Physical Exam   Blood pressure 126/73, pulse 94, temperature 98.1 F (36.7 C), temperature source Oral, resp. rate 18, last menstrual period 04/30/2013, SpO2 100.00%.  Physical Exam  Constitutional: She is oriented to person, place, and time. She appears  well-developed and well-nourished.  HENT:  Head: Normocephalic.  Neck: Normal range of motion. Neck supple.  Cardiovascular: Normal rate, regular rhythm and normal heart sounds.   Respiratory: Effort normal and breath sounds normal.  GI: Soft. There is no tenderness.  Genitourinary: No bleeding around the vagina. Vaginal discharge (normal discharge, negative pooling or thin discharge) found.  Cerclage visually and digitally in place; cervix visually closed  Musculoskeletal: Normal range of motion.  Neurological: She is alert and oriented to person, place, and time.  Skin: Skin is warm and dry.    MAU Course  Procedures  Results for orders placed during the hospital encounter of 09/20/13 (from the past 24 hour(s))  URINALYSIS, ROUTINE W REFLEX MICROSCOPIC     Status: Abnormal   Collection Time    09/21/13 12:02 AM      Result Value Range   Color, Urine YELLOW  YELLOW   APPearance CLEAR  CLEAR   Specific Gravity, Urine >1.030 (*) 1.005 - 1.030   pH 6.0  5.0 - 8.0   Glucose, UA NEGATIVE  NEGATIVE mg/dL   Hgb urine dipstick TRACE (*) NEGATIVE   Bilirubin Urine NEGATIVE  NEGATIVE   Ketones, ur NEGATIVE  NEGATIVE mg/dL   Protein, ur NEGATIVE  NEGATIVE mg/dL   Urobilinogen, UA 0.2  0.0 - 1.0 mg/dL   Nitrite NEGATIVE  NEGATIVE   Leukocytes, UA SMALL (*) NEGATIVE  URINE MICROSCOPIC-ADD ON     Status: Abnormal   Collection Time    09/21/13 12:02 AM      Result Value Range   Squamous Epithelial / LPF FEW (*) RARE   WBC, UA 7-10  <3 WBC/hpf   Bacteria, UA MANY (*) RARE   Urine-Other MUCOUS PRESENT     FHR 140's w/doppler  [id number] Consulted with Dr. Gaynell Face > reviewed HPI/exam/OB HX (sabs at 20 wks)/cerclage/exam > discharge home on bedrest  Assessment and Plan  27 yo G4P1021 at [redacted]w[redacted]d wks IUP Hx of Cervical Incompetence Vaginitis  Plan: Discharge home Diflucan 150 mg in MAU Bedrest Follow-up with Dr. Clearance Coots next week  [facility] 09/21/2013, 12:18 AM

## 2013-09-21 NOTE — MAU Note (Signed)
Patient states around 11 pm she woke up and felt extra moist. Went to the bathroom and noticed she was leaking watery fluid that was colorless and without odor.

## 2013-09-22 LAB — URINE CULTURE: Culture: 50000

## 2013-09-23 ENCOUNTER — Ambulatory Visit (INDEPENDENT_AMBULATORY_CARE_PROVIDER_SITE_OTHER): Payer: Medicaid Other | Admitting: Obstetrics

## 2013-09-23 ENCOUNTER — Encounter: Payer: Self-pay | Admitting: Obstetrics

## 2013-09-23 VITALS — BP 120/78 | Temp 99.1°F | Wt 250.0 lb

## 2013-09-23 DIAGNOSIS — O3432 Maternal care for cervical incompetence, second trimester: Secondary | ICD-10-CM

## 2013-09-23 DIAGNOSIS — Z3482 Encounter for supervision of other normal pregnancy, second trimester: Secondary | ICD-10-CM

## 2013-09-23 DIAGNOSIS — O3430 Maternal care for cervical incompetence, unspecified trimester: Secondary | ICD-10-CM

## 2013-09-23 DIAGNOSIS — Z3480 Encounter for supervision of other normal pregnancy, unspecified trimester: Secondary | ICD-10-CM

## 2013-09-23 DIAGNOSIS — O09219 Supervision of pregnancy with history of pre-term labor, unspecified trimester: Secondary | ICD-10-CM

## 2013-09-23 DIAGNOSIS — O0990 Supervision of high risk pregnancy, unspecified, unspecified trimester: Secondary | ICD-10-CM

## 2013-09-23 LAB — POCT URINALYSIS DIPSTICK
Bilirubin, UA: NEGATIVE
Glucose, UA: NEGATIVE
Ketones, UA: NEGATIVE
Urobilinogen, UA: NEGATIVE
pH, UA: 5

## 2013-09-23 MED ORDER — HYDROXYPROGESTERONE CAPROATE 250 MG/ML IM OIL
250.0000 mg | TOPICAL_OIL | INTRAMUSCULAR | Status: AC
  Administered 2013-09-23 – 2013-12-24 (×12): 250 mg via INTRAMUSCULAR

## 2013-09-23 NOTE — Progress Notes (Signed)
Pulse-93 Pt. States that she is having nausea and vomiting.  Pt. Is also having pain/pressure in pelvis.

## 2013-09-23 NOTE — Addendum Note (Signed)
Addended by: Henriette Combs on: 09/23/2013 01:41 PM   Modules accepted: Orders

## 2013-09-24 ENCOUNTER — Encounter: Payer: Self-pay | Admitting: Obstetrics & Gynecology

## 2013-09-29 ENCOUNTER — Ambulatory Visit (HOSPITAL_COMMUNITY): Payer: Medicaid Other | Attending: Obstetrics

## 2013-09-30 ENCOUNTER — Ambulatory Visit (INDEPENDENT_AMBULATORY_CARE_PROVIDER_SITE_OTHER): Payer: Medicaid Other | Admitting: Obstetrics & Gynecology

## 2013-09-30 ENCOUNTER — Encounter: Payer: Self-pay | Admitting: Obstetrics & Gynecology

## 2013-09-30 ENCOUNTER — Encounter: Payer: Medicaid Other | Admitting: Obstetrics

## 2013-09-30 VITALS — BP 114/79 | Temp 98.7°F | Wt 253.0 lb

## 2013-09-30 DIAGNOSIS — Z98891 History of uterine scar from previous surgery: Secondary | ICD-10-CM

## 2013-09-30 DIAGNOSIS — Z3480 Encounter for supervision of other normal pregnancy, unspecified trimester: Secondary | ICD-10-CM

## 2013-09-30 DIAGNOSIS — Z3482 Encounter for supervision of other normal pregnancy, second trimester: Secondary | ICD-10-CM

## 2013-09-30 DIAGNOSIS — E669 Obesity, unspecified: Secondary | ICD-10-CM

## 2013-09-30 DIAGNOSIS — O99212 Obesity complicating pregnancy, second trimester: Secondary | ICD-10-CM

## 2013-09-30 DIAGNOSIS — O09219 Supervision of pregnancy with history of pre-term labor, unspecified trimester: Secondary | ICD-10-CM

## 2013-09-30 LAB — POCT URINALYSIS DIPSTICK
Bilirubin, UA: NEGATIVE
Blood, UA: NEGATIVE
Glucose, UA: NEGATIVE
Nitrite, UA: NEGATIVE

## 2013-09-30 MED ORDER — PANTOPRAZOLE SODIUM 40 MG PO TBEC: 40.0000 mg | DELAYED_RELEASE_TABLET | Freq: Every day | ORAL | Status: DC

## 2013-09-30 MED ORDER — ONDANSETRON 8 MG PO TBDP: 8.0000 mg | ORAL_TABLET | Freq: Three times a day (TID) | ORAL | Status: DC | PRN

## 2013-09-30 MED ORDER — METOCLOPRAMIDE HCL 10 MG PO TABS: 10.0000 mg | ORAL_TABLET | Freq: Three times a day (TID) | ORAL | Status: DC

## 2013-09-30 NOTE — Progress Notes (Signed)
HR - 96 Pt in office for routine OB visit,states she needs refills for zofran, protonix and reglan.  U/S today: cerclage seen, minimal dynamic changes, cervical length > 3 cm.

## 2013-10-01 ENCOUNTER — Encounter: Payer: Self-pay | Admitting: Obstetrics & Gynecology

## 2013-10-01 DIAGNOSIS — O99210 Obesity complicating pregnancy, unspecified trimester: Secondary | ICD-10-CM | POA: Insufficient documentation

## 2013-10-01 DIAGNOSIS — Z98891 History of uterine scar from previous surgery: Secondary | ICD-10-CM | POA: Insufficient documentation

## 2013-10-07 ENCOUNTER — Other Ambulatory Visit: Payer: Self-pay | Admitting: *Deleted

## 2013-10-07 ENCOUNTER — Encounter: Payer: Self-pay | Admitting: Obstetrics

## 2013-10-07 ENCOUNTER — Ambulatory Visit (INDEPENDENT_AMBULATORY_CARE_PROVIDER_SITE_OTHER): Payer: Medicaid Other | Admitting: Obstetrics

## 2013-10-07 VITALS — BP 121/79 | Temp 98.2°F | Wt 252.0 lb

## 2013-10-07 DIAGNOSIS — O09219 Supervision of pregnancy with history of pre-term labor, unspecified trimester: Secondary | ICD-10-CM

## 2013-10-07 DIAGNOSIS — Z3482 Encounter for supervision of other normal pregnancy, second trimester: Secondary | ICD-10-CM

## 2013-10-07 DIAGNOSIS — O219 Vomiting of pregnancy, unspecified: Secondary | ICD-10-CM

## 2013-10-07 DIAGNOSIS — B379 Candidiasis, unspecified: Secondary | ICD-10-CM

## 2013-10-07 DIAGNOSIS — Z3480 Encounter for supervision of other normal pregnancy, unspecified trimester: Secondary | ICD-10-CM

## 2013-10-07 LAB — POCT URINALYSIS DIPSTICK
Blood, UA: NEGATIVE
Glucose, UA: NEGATIVE
Nitrite, UA: NEGATIVE
Protein, UA: NEGATIVE
Spec Grav, UA: <=1.005
Urobilinogen, UA: NEGATIVE

## 2013-10-07 MED ORDER — PROMETHAZINE HCL 25 MG RE SUPP: 25.0000 mg | Freq: Four times a day (QID) | RECTAL | Status: DC | PRN

## 2013-10-07 MED ORDER — FLUCONAZOLE 150 MG PO TABS: 150.0000 mg | ORAL_TABLET | Freq: Every day | ORAL | Status: DC

## 2013-10-07 NOTE — Progress Notes (Signed)
Pulse 101  Pt states that she is having some yellowish discharge with irritation. Diflucan sent to pharmacy.   Pt states pregnancy is going well.

## 2013-10-08 ENCOUNTER — Encounter: Payer: Self-pay | Admitting: Obstetrics

## 2013-10-14 ENCOUNTER — Ambulatory Visit (INDEPENDENT_AMBULATORY_CARE_PROVIDER_SITE_OTHER): Payer: Medicaid Other | Admitting: Obstetrics

## 2013-10-14 VITALS — BP 136/83 | Temp 98.5°F | Wt 251.0 lb

## 2013-10-14 DIAGNOSIS — Z3482 Encounter for supervision of other normal pregnancy, second trimester: Secondary | ICD-10-CM

## 2013-10-14 DIAGNOSIS — Z3480 Encounter for supervision of other normal pregnancy, unspecified trimester: Secondary | ICD-10-CM

## 2013-10-14 DIAGNOSIS — O09219 Supervision of pregnancy with history of pre-term labor, unspecified trimester: Secondary | ICD-10-CM

## 2013-10-14 LAB — POCT URINALYSIS DIPSTICK
Nitrite, UA: NEGATIVE
Spec Grav, UA: 1.025
Urobilinogen, UA: NEGATIVE
pH, UA: 5

## 2013-10-14 NOTE — Progress Notes (Signed)
Pulse 91 Pt would like to discuss spitting. Pt states that she has a problem with spitting saliva and mucous.  States that this is a persistent problem. Pt given 17P injection at today's visit in Right ventrogluteal. Pt tolerated well.

## 2013-10-15 ENCOUNTER — Encounter: Payer: Self-pay | Admitting: Obstetrics

## 2013-10-21 ENCOUNTER — Ambulatory Visit: Payer: Medicaid Other

## 2013-10-22 ENCOUNTER — Ambulatory Visit (INDEPENDENT_AMBULATORY_CARE_PROVIDER_SITE_OTHER): Payer: Medicaid Other | Admitting: Obstetrics

## 2013-10-22 ENCOUNTER — Encounter: Payer: Self-pay | Admitting: Obstetrics

## 2013-10-22 VITALS — BP 117/76 | Temp 98.8°F | Wt 250.0 lb

## 2013-10-22 DIAGNOSIS — Z3402 Encounter for supervision of normal first pregnancy, second trimester: Secondary | ICD-10-CM

## 2013-10-22 DIAGNOSIS — B3731 Acute candidiasis of vulva and vagina: Secondary | ICD-10-CM | POA: Insufficient documentation

## 2013-10-22 DIAGNOSIS — O09292 Supervision of pregnancy with other poor reproductive or obstetric history, second trimester: Secondary | ICD-10-CM

## 2013-10-22 DIAGNOSIS — O09219 Supervision of pregnancy with history of pre-term labor, unspecified trimester: Secondary | ICD-10-CM

## 2013-10-22 DIAGNOSIS — Z3400 Encounter for supervision of normal first pregnancy, unspecified trimester: Secondary | ICD-10-CM

## 2013-10-22 DIAGNOSIS — B373 Candidiasis of vulva and vagina: Secondary | ICD-10-CM | POA: Insufficient documentation

## 2013-10-22 LAB — POCT URINALYSIS DIPSTICK
Nitrite, UA: NEGATIVE
Urobilinogen, UA: NEGATIVE
pH, UA: 5

## 2013-10-22 MED ORDER — CLOTRIMAZOLE 1 % EX CREA: 1.0000 "application " | TOPICAL_CREAM | Freq: Two times a day (BID) | CUTANEOUS | Status: DC

## 2013-10-22 MED ORDER — FLUCONAZOLE 200 MG PO TABS: ORAL_TABLET | ORAL | Status: DC

## 2013-10-22 NOTE — Progress Notes (Signed)
HR - 89 Pt in office for routine OB visit, reports pinching sensation where her cerclage is, c/o thin yellow vaginal discharge with irritation and swelling, denies odor, reports having fatigue since vaginal symptoms started again.

## 2013-10-23 ENCOUNTER — Encounter: Payer: Self-pay | Admitting: Obstetrics

## 2013-10-24 ENCOUNTER — Ambulatory Visit: Payer: Medicaid Other

## 2013-10-28 ENCOUNTER — Ambulatory Visit: Payer: Medicaid Other

## 2013-11-03 ENCOUNTER — Other Ambulatory Visit: Payer: Self-pay | Admitting: *Deleted

## 2013-11-03 ENCOUNTER — Telehealth: Payer: Self-pay | Admitting: *Deleted

## 2013-11-03 DIAGNOSIS — A599 Trichomoniasis, unspecified: Secondary | ICD-10-CM

## 2013-11-03 MED ORDER — TINIDAZOLE 500 MG PO TABS: 2.0000 g | ORAL_TABLET | Freq: Every day | ORAL | Status: DC

## 2013-11-03 NOTE — Telephone Encounter (Signed)
Message copied by Glendell Docker on Mon Nov 03, 2013 11:45 AM ------      Message from: Coral Ceo A      Created: Thu Oct 23, 2013  3:13 PM       Tindamax 2 grams po daily x 2 days. ------

## 2013-11-03 NOTE — Telephone Encounter (Signed)
Patient returned phone call. She was asked if she was  Informed of her lab results and rx. Patient stated that she was informed of results and medication was confirmed that it was sent to the pharmacy. Patient verbalized understanding and agrees as instructed No further action is required.

## 2013-11-03 NOTE — Telephone Encounter (Signed)
Patient called and left voice message requesting lab results.  Call was returned to patient no answer. A voice message was left for patient to return call regarding lab results and Rx sent to pharmacy. Rx sent to pharmacy by Dr Clearance Coots. 11/03/2013.

## 2013-11-04 ENCOUNTER — Ambulatory Visit (INDEPENDENT_AMBULATORY_CARE_PROVIDER_SITE_OTHER): Payer: Medicaid Other | Admitting: Obstetrics

## 2013-11-04 ENCOUNTER — Other Ambulatory Visit: Payer: Self-pay | Admitting: *Deleted

## 2013-11-04 VITALS — BP 129/82 | HR 109 | Temp 98.5°F | Wt 246.0 lb

## 2013-11-04 DIAGNOSIS — O09219 Supervision of pregnancy with history of pre-term labor, unspecified trimester: Secondary | ICD-10-CM

## 2013-11-04 DIAGNOSIS — Z139 Encounter for screening, unspecified: Secondary | ICD-10-CM

## 2013-11-04 DIAGNOSIS — A599 Trichomoniasis, unspecified: Secondary | ICD-10-CM

## 2013-11-04 DIAGNOSIS — O0990 Supervision of high risk pregnancy, unspecified, unspecified trimester: Secondary | ICD-10-CM

## 2013-11-04 MED ORDER — METRONIDAZOLE 500 MG PO TABS: ORAL_TABLET | ORAL | Status: DC

## 2013-11-04 NOTE — Progress Notes (Signed)
Pt is in office today for 17p injection.  Injection given in RUOQ. Pt tolerated well.  Pt is to speak with Dr Clearance Coots at today visit as well.  Pt states that she has had more cramping for the past couple of weeks. Pt states that they have since eased off.  Pt states that the nausea/vomitting is back.  Pt states that medication isn't working anymore.

## 2013-11-05 ENCOUNTER — Encounter (HOSPITAL_COMMUNITY): Payer: Self-pay | Admitting: *Deleted

## 2013-11-05 ENCOUNTER — Inpatient Hospital Stay (HOSPITAL_COMMUNITY)
Admission: AD | Admit: 2013-11-05 | Discharge: 2013-11-05 | Disposition: A | Discharge status: Home / Self Care | Payer: Medicaid Other | Source: Ambulatory Visit | Attending: Obstetrics | Admitting: Obstetrics

## 2013-11-05 DIAGNOSIS — Y92009 Unspecified place in unspecified non-institutional (private) residence as the place of occurrence of the external cause: Secondary | ICD-10-CM | POA: Insufficient documentation

## 2013-11-05 DIAGNOSIS — O26899 Other specified pregnancy related conditions, unspecified trimester: Secondary | ICD-10-CM

## 2013-11-05 DIAGNOSIS — O212 Late vomiting of pregnancy: Secondary | ICD-10-CM | POA: Insufficient documentation

## 2013-11-05 DIAGNOSIS — W010XXA Fall on same level from slipping, tripping and stumbling without subsequent striking against object, initial encounter: Secondary | ICD-10-CM | POA: Insufficient documentation

## 2013-11-05 DIAGNOSIS — O99891 Other specified diseases and conditions complicating pregnancy: Secondary | ICD-10-CM | POA: Insufficient documentation

## 2013-11-05 DIAGNOSIS — K117 Disturbances of salivary secretion: Secondary | ICD-10-CM | POA: Insufficient documentation

## 2013-11-05 DIAGNOSIS — Y93F1 Activity, caregiving, bathing: Secondary | ICD-10-CM | POA: Insufficient documentation

## 2013-11-05 DIAGNOSIS — N949 Unspecified condition associated with female genital organs and menstrual cycle: Secondary | ICD-10-CM | POA: Insufficient documentation

## 2013-11-05 DIAGNOSIS — R109 Unspecified abdominal pain: Secondary | ICD-10-CM | POA: Insufficient documentation

## 2013-11-05 DIAGNOSIS — Z87891 Personal history of nicotine dependence: Secondary | ICD-10-CM | POA: Insufficient documentation

## 2013-11-05 DIAGNOSIS — O9989 Other specified diseases and conditions complicating pregnancy, childbirth and the puerperium: Secondary | ICD-10-CM | POA: Insufficient documentation

## 2013-11-05 DIAGNOSIS — O3430 Maternal care for cervical incompetence, unspecified trimester: Secondary | ICD-10-CM | POA: Insufficient documentation

## 2013-11-05 MED ORDER — CYCLOBENZAPRINE HCL 5 MG PO TABS: 5.0000 mg | ORAL_TABLET | Freq: Three times a day (TID) | ORAL | Status: DC | PRN

## 2013-11-05 NOTE — Progress Notes (Signed)
Pt describes the pain as "at times sharp but most of the time it is dull"

## 2013-11-05 NOTE — MAU Provider Note (Signed)
Chief Complaint:  Pelvic Pain   First Provider Initiated Contact with Patient 11/05/13 2009      HPI: Samantha Guzman is a 27 y.o. [id number] at [redacted]w[redacted]d who presents to maternity admissions reporting pain in her cervix after slipping getting out of the tub at noon today. States her feet slipped but she did not fall to the ground and sustained no injury; did not strike her abdomen. After this had been she lay down for 30 minutes but then experienced pain which she localizes to her cervix. The pain is sharp, intermittent and worse with movement, relieved by rest. Seen in the office today and received 17 P. She does have a  prophylactic cerclage in place.  Denies contractions, leakage of fluid or vaginal bleeding. Good fetal movement.   Pregnancy Course: Hx incompetent cx, 2nd tri loss; N/V of pregnancy with ptyalism  Past Medical History: Past Medical History  Diagnosis Date  . No significant past medical history   . Vertigo     Past obstetric history: OB History  Gravida Para Term Preterm AB SAB TAB Ectopic Multiple Living  4 1 1  0 2 2    1     # Outcome Date GA Lbr Len/2nd Weight Sex Delivery Anes PTL Lv  4 CUR           3 TRM 04/06/10 [redacted]w[redacted]d   F LTCS  Y Y  2 SAB 09/06/08 [redacted]w[redacted]d       N  1 SAB 02/05/08 [redacted]w[redacted]d       N      Past Surgical History: Past Surgical History  Procedure Laterality Date  . Cesarean section    . Cervical cerclage N/A 08/09/2013    Procedure: CERCLAGE CERVICAL;  Surgeon: Brock Bad, MD;  Location: [facility];  Service: Gynecology;  Laterality: N/A;     Family History: History reviewed. No pertinent family history.  Social History: History  Substance Use Topics  . Smoking status: Former Smoker    Quit date: 06/06/2009  . Smokeless tobacco: Not on file  . Alcohol Use: No     Comment: occ    Allergies: No Known Allergies  Meds:  Facility-administered medications prior to admission  Medication Dose Route Frequency Provider Last Rate Last Dose  .  hydroxyprogesterone caproate (DELALUTIN) 250 mg/mL injection 250 mg  250 mg Intramuscular Weekly Brock Bad, MD   250 mg at 11/04/13 1621   Prescriptions prior to admission  Medication Sig Dispense Refill  . metoCLOPramide (REGLAN) 10 MG tablet Take 1 tablet (10 mg total) by mouth 3 (three) times daily before meals.  60 tablet  2  . pantoprazole (PROTONIX) 40 MG tablet Take 1 tablet (40 mg total) by mouth daily.  60 tablet  2  . promethazine (PHENERGAN) 25 MG suppository Place 1 suppository (25 mg total) rectally every 6 (six) hours as needed for nausea or vomiting.  30 each  2    ROS: Pertinent findings in history of present illness.  Physical Exam  Blood pressure 117/70, pulse 100, temperature 98.4 F (36.9 C), temperature source Oral, resp. rate 18, last menstrual period 04/30/2013. GENERAL: Well-developed, well-nourished female in no acute distress.  HEENT: normocephalic HEART: normal rate RESP: normal effort ABDOMEN: Soft, Slightly tender on palpation of mid symphysis, gravid appropriate for gestational age EXTREMITIES: Nontender, no edema NEURO: alert and oriented SPECULUM EXAM: NEFG, physiologic discharge, no blood, cervix clean Dilation: Closed Effacement (%): Thick Cervical Position: Posterior Exam by:: D.Ilo Beamon CNM Cx long, cerclage intact,  no blood, no LUS development FHT:  Baseline 135-140 , moderate variability, 10 bpmaccelerations present, no decelerations Contractions: none   Labs: No results found for this or any previous visit (from the past 24 hour(s)).  Imaging:  No results found. MAU Course:   Assessment: 1. Pain of round ligament complicating pregnancy, antepartum   [id number] @[redacted]w[redacted]d     Plan: Discharge home Labor precautions and fetal kick counts    Medication List         cyclobenzaprine 5 MG tablet  Commonly known as:  FLEXERIL  Take 1 tablet (5 mg total) by mouth 3 (three) times daily as needed for muscle spasms.     metoCLOPramide 10 MG  tablet  Commonly known as:  REGLAN  Take 1 tablet (10 mg total) by mouth 3 (three) times daily before meals.     pantoprazole 40 MG tablet  Commonly known as:  PROTONIX  Take 1 tablet (40 mg total) by mouth daily.     promethazine 25 MG suppository  Commonly known as:  PHENERGAN  Place 1 suppository (25 mg total) rectally every 6 (six) hours as needed for nausea or vomiting.       Follow-up Information   Follow up with HARPER,CHARLES A, MD. (Keep her regular prenatal appointment)    Specialty:  Obstetrics and Gynecology   Contact information:   [address] [place] [address] [phone]       Danae Orleans, CNM 11/05/2013 8:26 PM

## 2013-11-05 NOTE — MAU Note (Signed)
Pt states she fell while getting out of the tub about 12pm pt states foot slipped when she hit the floor

## 2013-11-11 ENCOUNTER — Ambulatory Visit (INDEPENDENT_AMBULATORY_CARE_PROVIDER_SITE_OTHER): Payer: Medicaid Other | Admitting: Obstetrics

## 2013-11-11 ENCOUNTER — Encounter: Payer: Self-pay | Admitting: Obstetrics

## 2013-11-11 VITALS — BP 126/78 | Temp 98.5°F | Wt 252.0 lb

## 2013-11-11 DIAGNOSIS — Z3480 Encounter for supervision of other normal pregnancy, unspecified trimester: Secondary | ICD-10-CM

## 2013-11-11 DIAGNOSIS — Z3482 Encounter for supervision of other normal pregnancy, second trimester: Secondary | ICD-10-CM

## 2013-11-11 LAB — POCT URINALYSIS DIPSTICK
Spec Grav, UA: 1.015
pH, UA: 7

## 2013-11-11 NOTE — Progress Notes (Signed)
  Pt states she is having some menstrual like cramping a couple of times a day.

## 2013-11-18 ENCOUNTER — Encounter (HOSPITAL_COMMUNITY): Payer: Self-pay | Admitting: *Deleted

## 2013-11-18 ENCOUNTER — Ambulatory Visit (INDEPENDENT_AMBULATORY_CARE_PROVIDER_SITE_OTHER): Payer: Medicaid Other | Admitting: Obstetrics

## 2013-11-18 ENCOUNTER — Encounter: Payer: Self-pay | Admitting: Obstetrics

## 2013-11-18 ENCOUNTER — Inpatient Hospital Stay (HOSPITAL_COMMUNITY)
Admission: AD | Admit: 2013-11-18 | Discharge: 2013-11-18 | Disposition: A | Discharge status: Home / Self Care | Payer: Medicaid Other | Source: Ambulatory Visit | Attending: Obstetrics | Admitting: Obstetrics

## 2013-11-18 VITALS — BP 119/78 | Temp 98.4°F | Wt 253.0 lb

## 2013-11-18 DIAGNOSIS — O99210 Obesity complicating pregnancy, unspecified trimester: Secondary | ICD-10-CM

## 2013-11-18 DIAGNOSIS — O09219 Supervision of pregnancy with history of pre-term labor, unspecified trimester: Secondary | ICD-10-CM

## 2013-11-18 DIAGNOSIS — O3430 Maternal care for cervical incompetence, unspecified trimester: Secondary | ICD-10-CM | POA: Insufficient documentation

## 2013-11-18 DIAGNOSIS — Z98891 History of uterine scar from previous surgery: Secondary | ICD-10-CM

## 2013-11-18 DIAGNOSIS — O210 Mild hyperemesis gravidarum: Secondary | ICD-10-CM

## 2013-11-18 DIAGNOSIS — Z3480 Encounter for supervision of other normal pregnancy, unspecified trimester: Secondary | ICD-10-CM

## 2013-11-18 DIAGNOSIS — N949 Unspecified condition associated with female genital organs and menstrual cycle: Secondary | ICD-10-CM | POA: Insufficient documentation

## 2013-11-18 DIAGNOSIS — O368190 Decreased fetal movements, unspecified trimester, not applicable or unspecified: Secondary | ICD-10-CM | POA: Insufficient documentation

## 2013-11-18 HISTORY — DX: Maternal care for cervical incompetence, unspecified trimester: O34.30

## 2013-11-18 LAB — POCT URINALYSIS DIPSTICK
Bilirubin, UA: NEGATIVE
Glucose, UA: NEGATIVE
KETONES UA: NEGATIVE
Nitrite, UA: NEGATIVE
PH UA: 7
Spec Grav, UA: 1.015
Urobilinogen, UA: NEGATIVE

## 2013-11-18 NOTE — Discharge Instructions (Signed)
Fetal Movement Counts Patient Name: __________________________________________________ Patient Due Date: ____________________ Performing a fetal movement count is highly recommended in high-risk pregnancies, but it is good for every pregnant woman to do. Your caregiver may ask you to start counting fetal movements at 28 weeks of the pregnancy. Fetal movements often increase:  After eating a full meal.  After physical activity.  After eating or drinking something sweet or cold.  At rest. Pay attention to when you feel the baby is most active. This will help you notice a pattern of your baby's sleep and wake cycles and what factors contribute to an increase in fetal movement. It is important to perform a fetal movement count at the same time each day when your baby is normally most active.  HOW TO COUNT FETAL MOVEMENTS 1. Find a quiet and comfortable area to sit or lie down on your left side. Lying on your left side provides the best blood and oxygen circulation to your baby. 2. Write down the day and time on a sheet of paper or in a journal. 3. Start counting kicks, flutters, swishes, rolls, or jabs in a 2 hour period. You should feel at least 10 movements within 2 hours. 4. If you do not feel 10 movements in 2 hours, wait 2 3 hours and count again. Look for a change in the pattern or not enough counts in 2 hours. SEEK MEDICAL CARE IF:  You feel less than 10 counts in 2 hours, tried twice.  There is no movement in over an hour.  The pattern is changing or taking longer each day to reach 10 counts in 2 hours.  You feel the baby is not moving as he or she usually does. Date: ____________ Movements: ____________ Start time: ____________ Finish time: ____________  Date: ____________ Movements: ____________ Start time: ____________ Finish time: ____________ Date: ____________ Movements: ____________ Start time: ____________ Finish time: ____________ Date: ____________ Movements: ____________  Start time: ____________ Finish time: ____________ Date: ____________ Movements: ____________ Start time: ____________ Finish time: ____________ Date: ____________ Movements: ____________ Start time: ____________ Finish time: ____________ Date: ____________ Movements: ____________ Start time: ____________ Finish time: ____________ Date: ____________ Movements: ____________ Start time: ____________ Finish time: ____________  Date: ____________ Movements: ____________ Start time: ____________ Finish time: ____________ Date: ____________ Movements: ____________ Start time: ____________ Finish time: ____________ Date: ____________ Movements: ____________ Start time: ____________ Finish time: ____________ Date: ____________ Movements: ____________ Start time: ____________ Finish time: ____________ Date: ____________ Movements: ____________ Start time: ____________ Finish time: ____________ Date: ____________ Movements: ____________ Start time: ____________ Finish time: ____________ Date: ____________ Movements: ____________ Start time: ____________ Finish time: ____________  Date: ____________ Movements: ____________ Start time: ____________ Finish time: ____________ Date: ____________ Movements: ____________ Start time: ____________ Finish time: ____________ Date: ____________ Movements: ____________ Start time: ____________ Finish time: ____________ Date: ____________ Movements: ____________ Start time: ____________ Finish time: ____________ Date: ____________ Movements: ____________ Start time: ____________ Finish time: ____________ Date: ____________ Movements: ____________ Start time: ____________ Finish time: ____________ Date: ____________ Movements: ____________ Start time: ____________ Finish time: ____________  Date: ____________ Movements: ____________ Start time: ____________ Finish time: ____________ Date: ____________ Movements: ____________ Start time: ____________ Finish time:  ____________ Date: ____________ Movements: ____________ Start time: ____________ Finish time: ____________ Date: ____________ Movements: ____________ Start time: ____________ Finish time: ____________ Date: ____________ Movements: ____________ Start time: ____________ Finish time: ____________ Date: ____________ Movements: ____________ Start time: ____________ Finish time: ____________ Date: ____________ Movements: ____________ Start time: ____________ Finish time: ____________  Date: ____________ Movements: ____________ Start time: ____________ Finish   time: ____________ Date: ____________ Movements: ____________ Start time: ____________ Finish time: ____________ Date: ____________ Movements: ____________ Start time: ____________ Finish time: ____________ Date: ____________ Movements: ____________ Start time: ____________ Finish time: ____________ Date: ____________ Movements: ____________ Start time: ____________ Finish time: ____________ Date: ____________ Movements: ____________ Start time: ____________ Finish time: ____________ Date: ____________ Movements: ____________ Start time: ____________ Finish time: ____________  Date: ____________ Movements: ____________ Start time: ____________ Finish time: ____________ Date: ____________ Movements: ____________ Start time: ____________ Finish time: ____________ Date: ____________ Movements: ____________ Start time: ____________ Finish time: ____________ Date: ____________ Movements: ____________ Start time: ____________ Finish time: ____________ Date: ____________ Movements: ____________ Start time: ____________ Finish time: ____________ Date: ____________ Movements: ____________ Start time: ____________ Finish time: ____________ Date: ____________ Movements: ____________ Start time: ____________ Finish time: ____________  Date: ____________ Movements: ____________ Start time: ____________ Finish time: ____________ Date: ____________ Movements:  ____________ Start time: ____________ Finish time: ____________ Date: ____________ Movements: ____________ Start time: ____________ Finish time: ____________ Date: ____________ Movements: ____________ Start time: ____________ Finish time: ____________ Date: ____________ Movements: ____________ Start time: ____________ Finish time: ____________ Date: ____________ Movements: ____________ Start time: ____________ Finish time: ____________ Date: ____________ Movements: ____________ Start time: ____________ Finish time: ____________  Date: ____________ Movements: ____________ Start time: ____________ Finish time: ____________ Date: ____________ Movements: ____________ Start time: ____________ Finish time: ____________ Date: ____________ Movements: ____________ Start time: ____________ Finish time: ____________ Date: ____________ Movements: ____________ Start time: ____________ Finish time: ____________ Date: ____________ Movements: ____________ Start time: ____________ Finish time: ____________ Date: ____________ Movements: ____________ Start time: ____________ Finish time: ____________ Document Released: 11/22/2006 Document Revised: 10/09/2012 Document Reviewed: 08/19/2012 [employer] Patient Information 2014 [employer].  

## 2013-11-18 NOTE — Progress Notes (Signed)
HR - 107  Pt in office today for routine OB visit and 17P injection, reports decreased fetal movement, states less than 5 an hour and sometimes skipping several hours, reports yellow vaginal discharge, having irritation to outer pubic area

## 2013-11-18 NOTE — MAU Note (Signed)
Pt states she thinks her pelvic pain is from her cerclage, has been ongoing since it was placed.

## 2013-11-18 NOTE — MAU Note (Signed)
Sent from office for monitoring, decreased fetal movement. Denies bleeding or leaking. Denies contractions or cramping; state pain is from cerclage.

## 2013-11-20 ENCOUNTER — Encounter: Payer: Self-pay | Admitting: Obstetrics

## 2013-11-25 ENCOUNTER — Ambulatory Visit (INDEPENDENT_AMBULATORY_CARE_PROVIDER_SITE_OTHER): Payer: Medicaid Other | Admitting: Obstetrics

## 2013-11-25 VITALS — BP 121/80 | Temp 97.6°F | Wt 251.0 lb

## 2013-11-25 DIAGNOSIS — O09219 Supervision of pregnancy with history of pre-term labor, unspecified trimester: Secondary | ICD-10-CM

## 2013-11-25 DIAGNOSIS — O3660X0 Maternal care for excessive fetal growth, unspecified trimester, not applicable or unspecified: Secondary | ICD-10-CM

## 2013-11-25 DIAGNOSIS — B3731 Acute candidiasis of vulva and vagina: Secondary | ICD-10-CM

## 2013-11-25 DIAGNOSIS — B373 Candidiasis of vulva and vagina: Secondary | ICD-10-CM

## 2013-11-25 MED ORDER — FLUCONAZOLE 150 MG PO TABS: 150.0000 mg | ORAL_TABLET | Freq: Once | ORAL | Status: DC

## 2013-11-25 NOTE — Progress Notes (Signed)
Pulse 102 Pt states that she is still having itching in her groin area. Denies any vaginal discomfort, d/c.  Pt was positive for yeast and trich in December and had some relief from treatment. Pt 17P injection given in Right upper outer quadrant at today's visit. Pt tolerated well.

## 2013-11-26 ENCOUNTER — Encounter: Payer: Self-pay | Admitting: Obstetrics

## 2013-12-03 ENCOUNTER — Encounter: Payer: Self-pay | Admitting: Obstetrics & Gynecology

## 2013-12-03 ENCOUNTER — Encounter: Payer: Self-pay | Admitting: Obstetrics

## 2013-12-03 ENCOUNTER — Ambulatory Visit (INDEPENDENT_AMBULATORY_CARE_PROVIDER_SITE_OTHER): Payer: Medicaid Other

## 2013-12-03 ENCOUNTER — Ambulatory Visit (INDEPENDENT_AMBULATORY_CARE_PROVIDER_SITE_OTHER): Payer: Medicaid Other | Admitting: Obstetrics

## 2013-12-03 VITALS — BP 100/64 | Temp 99.6°F | Wt 205.0 lb

## 2013-12-03 DIAGNOSIS — Z1389 Encounter for screening for other disorder: Secondary | ICD-10-CM

## 2013-12-03 DIAGNOSIS — O211 Hyperemesis gravidarum with metabolic disturbance: Secondary | ICD-10-CM

## 2013-12-03 DIAGNOSIS — Z3480 Encounter for supervision of other normal pregnancy, unspecified trimester: Secondary | ICD-10-CM

## 2013-12-03 DIAGNOSIS — O3660X0 Maternal care for excessive fetal growth, unspecified trimester, not applicable or unspecified: Secondary | ICD-10-CM

## 2013-12-03 DIAGNOSIS — O09219 Supervision of pregnancy with history of pre-term labor, unspecified trimester: Secondary | ICD-10-CM

## 2013-12-03 LAB — POCT URINALYSIS DIPSTICK
GLUCOSE UA: NEGATIVE
KETONES UA: 1
Nitrite, UA: NEGATIVE
Protein, UA: 2
RBC UA: 50
SPEC GRAV UA: 1.02
pH, UA: 5

## 2013-12-03 LAB — US OB DETAIL + 14 WK

## 2013-12-03 MED ORDER — ENSURE COMPLETE SHAKE PO LIQD: 1.0000 | Freq: Two times a day (BID) | ORAL | Status: DC

## 2013-12-03 NOTE — Progress Notes (Signed)
Patient states she is having some lower abdomen and pelvic pain and pressure. Patient states she is have a lot of cramping like irritability at night. Patient states she is feeling nauseous and swimmy headed. Patient states her chest hurts and it is hard for her to breathe. Patient does have a low grade temperature of 99.6.

## 2013-12-08 ENCOUNTER — Encounter: Payer: Self-pay | Admitting: Obstetrics

## 2013-12-08 LAB — US OB DETAIL + 14 WK

## 2013-12-09 ENCOUNTER — Encounter: Payer: Medicaid Other | Admitting: Obstetrics

## 2013-12-16 ENCOUNTER — Encounter: Payer: Self-pay | Admitting: Obstetrics

## 2013-12-16 ENCOUNTER — Ambulatory Visit (INDEPENDENT_AMBULATORY_CARE_PROVIDER_SITE_OTHER): Payer: Medicaid Other | Admitting: Obstetrics

## 2013-12-16 VITALS — BP 116/74 | Temp 98.4°F | Wt 257.0 lb

## 2013-12-16 DIAGNOSIS — O09219 Supervision of pregnancy with history of pre-term labor, unspecified trimester: Secondary | ICD-10-CM

## 2013-12-16 DIAGNOSIS — Z3480 Encounter for supervision of other normal pregnancy, unspecified trimester: Secondary | ICD-10-CM

## 2013-12-16 LAB — POCT URINALYSIS DIPSTICK
BILIRUBIN UA: NEGATIVE
Blood, UA: NEGATIVE
Glucose, UA: NEGATIVE
Ketones, UA: NEGATIVE
Nitrite, UA: NEGATIVE
PH UA: 6
Spec Grav, UA: 1.02
Urobilinogen, UA: NEGATIVE

## 2013-12-16 NOTE — Progress Notes (Signed)
Pulse: 101 Patient states she is having some contractions mostly at night and that they are more consistent at night. Patient states she is having pelvic pain and pressure. Patient states she is concerned about her cerclage because she knows that is where the majority of the pain is coming from. Patient states that any movement with her legs is painful. Patient states that the pain is mostly dull but when she moves it is a sharp pain.

## 2013-12-23 ENCOUNTER — Encounter: Payer: Medicaid Other | Admitting: Obstetrics

## 2013-12-24 ENCOUNTER — Encounter: Payer: Self-pay | Admitting: Obstetrics

## 2013-12-24 ENCOUNTER — Ambulatory Visit (INDEPENDENT_AMBULATORY_CARE_PROVIDER_SITE_OTHER): Payer: Medicaid Other | Admitting: Obstetrics

## 2013-12-24 VITALS — BP 120/79 | Temp 98.8°F | Wt 253.0 lb

## 2013-12-24 DIAGNOSIS — O09219 Supervision of pregnancy with history of pre-term labor, unspecified trimester: Secondary | ICD-10-CM

## 2013-12-24 DIAGNOSIS — Z3480 Encounter for supervision of other normal pregnancy, unspecified trimester: Secondary | ICD-10-CM

## 2013-12-24 LAB — POCT URINALYSIS DIPSTICK
Bilirubin, UA: NEGATIVE
GLUCOSE UA: NEGATIVE
Ketones, UA: NEGATIVE
Nitrite, UA: NEGATIVE
Protein, UA: NEGATIVE
RBC UA: NEGATIVE
Spec Grav, UA: 1.015
Urobilinogen, UA: NEGATIVE
pH, UA: 6

## 2013-12-24 NOTE — Progress Notes (Signed)
Pulse 111 Pt is doing well. Pt states she would like to have stitch looked at and when will it come out. 17P given at today's visit. Pt tolerated well.

## 2013-12-26 LAB — STREP B DNA PROBE: GBSP: POSITIVE

## 2013-12-31 ENCOUNTER — Encounter: Payer: Self-pay | Admitting: Obstetrics

## 2013-12-31 ENCOUNTER — Ambulatory Visit (INDEPENDENT_AMBULATORY_CARE_PROVIDER_SITE_OTHER): Payer: Medicaid Other | Admitting: Obstetrics

## 2013-12-31 VITALS — BP 122/88 | Temp 97.6°F | Wt 252.0 lb

## 2013-12-31 DIAGNOSIS — Z3480 Encounter for supervision of other normal pregnancy, unspecified trimester: Secondary | ICD-10-CM

## 2013-12-31 DIAGNOSIS — R52 Pain, unspecified: Secondary | ICD-10-CM

## 2013-12-31 LAB — POCT URINALYSIS DIPSTICK
Bilirubin, UA: NEGATIVE
Blood, UA: NEGATIVE
Glucose, UA: NEGATIVE
KETONES UA: NEGATIVE
Nitrite, UA: NEGATIVE
PROTEIN UA: NEGATIVE
SPEC GRAV UA: 1.015
Urobilinogen, UA: NEGATIVE
pH, UA: 6

## 2013-12-31 MED ORDER — TRAMADOL HCL 50 MG PO TABS: 50.0000 mg | ORAL_TABLET | Freq: Four times a day (QID) | ORAL | Status: DC | PRN

## 2013-12-31 NOTE — Progress Notes (Signed)
Patient states she is having pain and pressure in her pelvis.

## 2014-01-07 ENCOUNTER — Encounter: Payer: Self-pay | Admitting: Obstetrics

## 2014-01-07 ENCOUNTER — Ambulatory Visit (INDEPENDENT_AMBULATORY_CARE_PROVIDER_SITE_OTHER): Payer: Medicaid Other | Admitting: Obstetrics

## 2014-01-07 VITALS — BP 112/74 | Temp 98.2°F | Wt 251.0 lb

## 2014-01-07 DIAGNOSIS — Z9889 Other specified postprocedural states: Secondary | ICD-10-CM

## 2014-01-07 DIAGNOSIS — O09299 Supervision of pregnancy with other poor reproductive or obstetric history, unspecified trimester: Secondary | ICD-10-CM

## 2014-01-07 DIAGNOSIS — B373 Candidiasis of vulva and vagina: Secondary | ICD-10-CM

## 2014-01-07 DIAGNOSIS — Z3480 Encounter for supervision of other normal pregnancy, unspecified trimester: Secondary | ICD-10-CM

## 2014-01-07 DIAGNOSIS — B3731 Acute candidiasis of vulva and vagina: Secondary | ICD-10-CM

## 2014-01-07 DIAGNOSIS — N760 Acute vaginitis: Secondary | ICD-10-CM

## 2014-01-07 DIAGNOSIS — A6000 Herpesviral infection of urogenital system, unspecified: Secondary | ICD-10-CM

## 2014-01-07 LAB — POCT URINALYSIS DIPSTICK
BILIRUBIN UA: NEGATIVE
Glucose, UA: NEGATIVE
KETONES UA: NEGATIVE
Nitrite, UA: NEGATIVE
PH UA: 5
RBC UA: NEGATIVE
SPEC GRAV UA: 1.02
Urobilinogen, UA: NEGATIVE

## 2014-01-07 MED ORDER — VALACYCLOVIR HCL 1 G PO TABS: 1000.0000 mg | ORAL_TABLET | Freq: Every day | ORAL | Status: DC

## 2014-01-07 NOTE — Progress Notes (Signed)
Pulse 130 Pt states that she isn't feeling well.  Pt states that she feel jittery.

## 2014-01-07 NOTE — Progress Notes (Signed)
Cerclage removed without complications.

## 2014-01-08 LAB — WET PREP BY MOLECULAR PROBE
Candida species: NEGATIVE
Gardnerella vaginalis: POSITIVE — AB
Trichomonas vaginosis: NEGATIVE

## 2014-01-10 LAB — WOUND CULTURE

## 2014-01-14 ENCOUNTER — Encounter: Payer: Medicaid Other | Admitting: Obstetrics

## 2014-01-16 ENCOUNTER — Inpatient Hospital Stay (HOSPITAL_COMMUNITY)
Admission: AD | Admit: 2014-01-16 | Discharge: 2014-01-16 | Disposition: A | Discharge status: Home / Self Care | Payer: Medicaid Other | Source: Ambulatory Visit | Attending: Obstetrics | Admitting: Obstetrics

## 2014-01-16 ENCOUNTER — Encounter (HOSPITAL_COMMUNITY): Payer: Self-pay | Admitting: *Deleted

## 2014-01-16 DIAGNOSIS — O99891 Other specified diseases and conditions complicating pregnancy: Secondary | ICD-10-CM | POA: Insufficient documentation

## 2014-01-16 DIAGNOSIS — O9989 Other specified diseases and conditions complicating pregnancy, childbirth and the puerperium: Principal | ICD-10-CM

## 2014-01-16 DIAGNOSIS — Z98891 History of uterine scar from previous surgery: Secondary | ICD-10-CM

## 2014-01-16 DIAGNOSIS — O3430 Maternal care for cervical incompetence, unspecified trimester: Secondary | ICD-10-CM | POA: Insufficient documentation

## 2014-01-16 DIAGNOSIS — O34219 Maternal care for unspecified type scar from previous cesarean delivery: Secondary | ICD-10-CM | POA: Insufficient documentation

## 2014-01-16 DIAGNOSIS — O210 Mild hyperemesis gravidarum: Secondary | ICD-10-CM

## 2014-01-16 DIAGNOSIS — N949 Unspecified condition associated with female genital organs and menstrual cycle: Secondary | ICD-10-CM | POA: Insufficient documentation

## 2014-01-16 DIAGNOSIS — R109 Unspecified abdominal pain: Secondary | ICD-10-CM | POA: Insufficient documentation

## 2014-01-16 DIAGNOSIS — O99210 Obesity complicating pregnancy, unspecified trimester: Secondary | ICD-10-CM

## 2014-01-16 NOTE — Discharge Instructions (Signed)
Vaginal Birth After Cesarean Delivery Vaginal birth after cesarean delivery (VBAC) is giving birth vaginally after previously delivering a baby by a cesarean. In the past, if a woman had a cesarean delivery, all births afterwards would be done by cesarean delivery. This is no longer true. It can be safe for the mother to try a vaginal delivery after having a cesarean delivery.  It is important to discuss VBAC with your health care provider early in the pregnancy so you can understand the risks, benefits, and options. It will give you time to decide what is best in your particular case. The final decision about whether to have a VBAC or repeat cesarean delivery should be between you and your health care provider. Any changes in your health or your baby's health during your pregnancy may make it necessary to change your initial decision about VBAC.  WOMEN WHO PLAN TO HAVE A VBAC SHOULD CHECK WITH THEIR HEALTH CARE PROVIDER TO BE SURE THAT:  The previous cesarean delivery was done with a low transverse uterine cut (incision) (not a vertical classical incision).   The birth canal is big enough for the baby.   There were no other operations on the uterus.   An electronic fetal monitor (EFM) will be on at all times during labor.   An operating room will be available and ready in case an emergency cesarean delivery is needed.   A health care provider and surgical nursing staff will be available at all times during labor to be ready to do an emergency delivery cesarean if necessary.   An anesthesiologist will be present in case an emergency cesarean delivery is needed.   The nursery is prepared and has adequate personnel and necessary equipment available to care for the baby in case of an emergency cesarean delivery. BENEFITS OF VBAC  Shorter stay in the hospital.   Avoidance of risks associated with cesarean delivery, such as:  Surgical complications, such as opening of the incision or  hernia in the incision.  Injury to other organs.  Fever. This can occur if an infection develops after surgery. It can also occur as a reaction to the medicine given to make you numb during the surgery.  Less blood loss and need for blood transfusions.  Lower risk of blood clots and infection.  Shorter recovery.   Decreased risk for having to remove the uterus (hysterectomy).   Decreased risk for the placenta to completely or partially cover the opening of the uterus (placenta previa) with a future pregnancy.   Decrease risk in future labor and delivery. RISKS OF A VBAC  Tearing (rupture) of the uterus. This is occurs in less than 1% of VBACs. The risk of this happening is higher if:  Steps are taken to begin the labor process (induce labor) or stimulate or strengthen contractions (augment labor).   Medicine is used to soften (ripen) the cervix.  Having to remove the uterus (hysterectomy) if it ruptures. VBAC SHOULD NOT BE DONE IF:  The previous cesarean delivery was done with a vertical (classical) or T-shaped incision or you do not know what kind of incision was made.   You had a ruptured uterus.   You have had certain types of surgery on your uterus, such as removal of uterine fibroids. Ask your health care provider about other types of surgeries that prevent you from having a VBAC.  You have certain medical or childbirth (obstetrical) problems.   There are problems with the baby.   You   have had two previous cesarean deliveries and no vaginal deliveries. OTHER FACTS TO KNOW ABOUT VBAC:  It is safe to have an epidural anesthetic with VBAC.   It is safe to turn the baby from a breech position (attempt an external cephalic version).   It is safe to try a VBAC with twins.   VBAC may not be successful if your baby weights 8.8 lb (4 kg) or more. However, weight predictions are not always accurate and should not be used alone to decide if VBAC is right for  you.  There is an increased failure rate if the time between the cesarean delivery and VBAC is less than 19 months.   Your health care provider may advise against a VBAC if you have preeclampsia (high blood pressure, protein in the urine, and swelling of face and extremities).   VBAC is often successful if you previously gave birth vaginally.   VBAC is often successful when the labor starts spontaneously before the due date.   Delivering a baby through a VBAC is similar to having a normal spontaneous vaginal delivery. Document Released: 04/15/2007 Document Revised: 08/13/2013 Document Reviewed: 05/22/2013 [employer] Patient Information 2014 [employer].  

## 2014-01-16 NOTE — Progress Notes (Signed)
Samantha Guzman is a 28 y.o. [id number] at 623w2d  Patient sent from MAU for evaluation, there was not enough room.   Subjective: Patient reports pelvic pain, states is feels like her pelvis is splitting. Denies contractions. Denies vaginal bleeding or LOF. Reports + pelvic pressure. States the pressure makes it difficult for her to walk. States its been ongoing, the medication she has taken in the past doesn't help. Denies labor.   Objective: BP 114/75  Pulse 92  Temp(Src) 98.4 F (36.9 C) (Oral)  Resp 18  Ht 5\' 6"  (1.676 m)  Wt 255 lb (115.667 kg)  BMI 41.18 kg/m2  LMP 04/30/2013      FHT:  FHR: 140 bpm, variability: moderate,  accelerations:  Present,  decelerations:  Absent UC:   none SVE:   Dilation: Fingertip Station: -3 Exam by:: Dory HornAmy Soniyah Mcglory, CNM  Labs: Lab Results  Component Value Date   WBC 10.4 08/29/2013   HGB 10.4* 08/29/2013   HCT 29.8* 08/29/2013   MCV 79.5 08/29/2013   PLT 234 08/29/2013    Assessment / Plan: Pelvic discomfort of pregnancy Previous C-section, counseled patient of VBAC. Patient seems to desire VBAC, encouraged her to sign consent at next appt. Patient d/c home w/ precautions. To return to MAU PRN, encouraged patient to make appt next week in clinic.  Labor: Not in labor Preeclampsia:  Not in active labor Fetal Wellbeing:  Category I Pain Control:  No medications, reviewed comfort measures I/D:  n/a Anticipated MOD:  undecided, reviewed VBAC  Samantha Guzman 01/16/2014, 5:12 PM

## 2014-01-16 NOTE — OB Triage Note (Signed)
Pt d/c home with d/c instructions given verbalizes understanding. F/u in clinic next week

## 2014-01-16 NOTE — MAU Note (Signed)
Cerclage removed about 9 days ago- has not been checked.  Feeling pressure and having sharp pains in lower abd and pelvis.  Pain is constant as long as up.

## 2014-01-20 ENCOUNTER — Ambulatory Visit (INDEPENDENT_AMBULATORY_CARE_PROVIDER_SITE_OTHER): Payer: Medicaid Other | Admitting: Obstetrics

## 2014-01-20 ENCOUNTER — Encounter: Payer: Self-pay | Admitting: Obstetrics

## 2014-01-20 VITALS — Temp 98.4°F | Wt 259.0 lb

## 2014-01-20 DIAGNOSIS — Z3480 Encounter for supervision of other normal pregnancy, unspecified trimester: Secondary | ICD-10-CM

## 2014-01-20 NOTE — Progress Notes (Signed)
Pulse:  Patient states she is having lower abdominal and pelvic pain and pressure. Patient denies any concerns.

## 2014-01-28 ENCOUNTER — Encounter: Payer: Self-pay | Admitting: Obstetrics

## 2014-01-28 ENCOUNTER — Encounter (HOSPITAL_COMMUNITY): Payer: Self-pay | Admitting: General Practice

## 2014-01-28 ENCOUNTER — Ambulatory Visit (INDEPENDENT_AMBULATORY_CARE_PROVIDER_SITE_OTHER): Payer: Medicaid Other | Admitting: Obstetrics

## 2014-01-28 ENCOUNTER — Encounter: Payer: Medicaid Other | Admitting: Obstetrics

## 2014-01-28 ENCOUNTER — Inpatient Hospital Stay (HOSPITAL_COMMUNITY)
Admission: AD | Admit: 2014-01-28 | Discharge: 2014-01-28 | Disposition: A | Discharge status: Home / Self Care | Payer: Medicaid Other | Source: Ambulatory Visit | Attending: Obstetrics & Gynecology | Admitting: Obstetrics & Gynecology

## 2014-01-28 VITALS — BP 127/85 | Temp 99.1°F | Wt 258.0 lb

## 2014-01-28 DIAGNOSIS — O368390 Maternal care for abnormalities of the fetal heart rate or rhythm, unspecified trimester, not applicable or unspecified: Secondary | ICD-10-CM

## 2014-01-28 DIAGNOSIS — Z3480 Encounter for supervision of other normal pregnancy, unspecified trimester: Secondary | ICD-10-CM

## 2014-01-28 DIAGNOSIS — R350 Frequency of micturition: Secondary | ICD-10-CM | POA: Insufficient documentation

## 2014-01-28 DIAGNOSIS — O212 Late vomiting of pregnancy: Secondary | ICD-10-CM | POA: Insufficient documentation

## 2014-01-28 DIAGNOSIS — O219 Vomiting of pregnancy, unspecified: Secondary | ICD-10-CM

## 2014-01-28 DIAGNOSIS — Z87891 Personal history of nicotine dependence: Secondary | ICD-10-CM | POA: Insufficient documentation

## 2014-01-28 DIAGNOSIS — O3430 Maternal care for cervical incompetence, unspecified trimester: Secondary | ICD-10-CM | POA: Insufficient documentation

## 2014-01-28 LAB — CBC
HEMATOCRIT: 29.6 % — AB (ref 36.0–46.0)
Hemoglobin: 9.4 g/dL — ABNORMAL LOW (ref 12.0–15.0)
MCH: 23 pg — ABNORMAL LOW (ref 26.0–34.0)
MCHC: 31.8 g/dL (ref 30.0–36.0)
MCV: 72.4 fL — AB (ref 78.0–100.0)
Platelets: 301 10*3/uL (ref 150–400)
RBC: 4.09 MIL/uL (ref 3.87–5.11)
RDW: 14.1 % (ref 11.5–15.5)
WBC: 10.4 10*3/uL (ref 4.0–10.5)

## 2014-01-28 LAB — URINALYSIS, ROUTINE W REFLEX MICROSCOPIC
Bilirubin Urine: NEGATIVE
GLUCOSE, UA: NEGATIVE mg/dL
HGB URINE DIPSTICK: NEGATIVE
Ketones, ur: 40 mg/dL — AB
Nitrite: NEGATIVE
PROTEIN: 30 mg/dL — AB
Specific Gravity, Urine: >1.03 — ABNORMAL HIGH (ref 1.005–1.030)
Urobilinogen, UA: 1 mg/dL (ref 0.0–1.0)
pH: 6 (ref 5.0–8.0)

## 2014-01-28 LAB — COMPREHENSIVE METABOLIC PANEL
ALT: 41 U/L — ABNORMAL HIGH (ref 0–35)
AST: 30 U/L (ref 0–37)
Albumin: 2.7 g/dL — ABNORMAL LOW (ref 3.5–5.2)
Alkaline Phosphatase: 168 U/L — ABNORMAL HIGH (ref 39–117)
BILIRUBIN TOTAL: 0.4 mg/dL (ref 0.3–1.2)
BUN: 7 mg/dL (ref 6–23)
CHLORIDE: 103 meq/L (ref 96–112)
CO2: 23 meq/L (ref 19–32)
Calcium: 9.1 mg/dL (ref 8.4–10.5)
Creatinine, Ser: 0.74 mg/dL (ref 0.50–1.10)
Glucose, Bld: 87 mg/dL (ref 70–99)
Potassium: 3.8 mEq/L (ref 3.7–5.3)
Sodium: 139 mEq/L (ref 137–147)
Total Protein: 6.4 g/dL (ref 6.0–8.3)

## 2014-01-28 LAB — URINE MICROSCOPIC-ADD ON

## 2014-01-28 MED ORDER — ONDANSETRON HCL 4 MG/2ML IJ SOLN
4.0000 mg | Freq: Once | INTRAMUSCULAR | Status: AC
  Administered 2014-01-28: 4 mg via INTRAVENOUS
  Filled 2014-01-28: qty 2

## 2014-01-28 MED ORDER — FAMOTIDINE IN NACL 20-0.9 MG/50ML-% IV SOLN
20.0000 mg | Freq: Once | INTRAVENOUS | Status: AC
  Administered 2014-01-28: 20 mg via INTRAVENOUS
  Filled 2014-01-28: qty 50

## 2014-01-28 MED ORDER — DEXTROSE 5 % IN LACTATED RINGERS IV BOLUS
1000.0000 mL | Freq: Once | INTRAVENOUS | Status: AC
  Administered 2014-01-28: 1000 mL via INTRAVENOUS

## 2014-01-28 NOTE — MAU Provider Note (Addendum)
History     CSN: [id number]  Arrival date and time: 01/28/14 1515   First Provider Initiated Contact with Patient 01/28/14 1604      Chief Complaint  Patient presents with  . Emesis During Pregnancy  . Pelvic Pain   HPI  Pt is 7338w0d pregnant seen at the office this afternoon and was sent over for nausea and vomiting.  Pt has not been Able to eat and keep food down on Sunday 3 days ago.  Pt denies contractions- pt had unsatisfactory NST At the office.  Pt denies spotting or bleeding. Pt had a cerclage that was removed at 37 weeks. Pt c/o pain with urination ?due to uterine cramping or bladder.  Pt has frequency of urination voiding every hour. Pt has pressure with pregnancy.  Past Medical History  Diagnosis Date  . No significant past medical history   . Vertigo   . Incompetent cervix in pregnancy     Past Surgical History  Procedure Laterality Date  . Cesarean section    . Cervical cerclage N/A 08/09/2013    Procedure: CERCLAGE CERVICAL;  Surgeon: Brock Badharles A Harper, MD;  Location: [facility];  Service: Gynecology;  Laterality: N/A;    Family History  Problem Relation Age of Onset  . Stroke Father     History  Substance Use Topics  . Smoking status: Former Smoker    Quit date: 06/06/2009  . Smokeless tobacco: Not on file  . Alcohol Use: No     Comment: occ    Allergies:  Allergies  Allergen Reactions  . Tramadol     Tramadol caused itching.    Prescriptions prior to admission  Medication Sig Dispense Refill  . pantoprazole (PROTONIX) 40 MG tablet Take 1 tablet (40 mg total) by mouth daily.  60 tablet  2  . promethazine (PHENERGAN) 50 MG suppository Place 50 mg rectally every 6 (six) hours as needed for nausea or vomiting.        Review of Systems  Constitutional: Negative for fever and chills.  Gastrointestinal: Positive for heartburn, nausea and vomiting. Negative for abdominal pain, diarrhea and constipation.   Physical Exam   Blood pressure 123/77,  pulse 136, temperature 99.3 F (37.4 C), temperature source Oral, resp. rate 20, last menstrual period 04/30/2013.  Physical Exam  Vitals reviewed. Constitutional: She is oriented to person, place, and time. She appears well-developed and well-nourished. No distress.  HENT:  Head: Normocephalic.  Eyes: Pupils are equal, round, and reactive to light.  Neck: Normal range of motion. Neck supple.  Cardiovascular: Normal rate.   Respiratory: Effort normal.  GI: Soft. She exhibits no distension. There is no tenderness. There is no rebound.  Musculoskeletal: Normal range of motion.  Neurological: She is alert and oriented to person, place, and time.  Skin: Skin is warm and dry.  Psychiatric: She has a normal mood and affect.   FHT: 130, moderate with 15x15 accels Toco: no UCs  MAU Course  Procedures  Discussed with Dr. Tamela OddiJackson Moore- will give IVF and Zofran and Pepcid and monitor baby Care [facility] Cortney Mckinney, CNM 1619: Patient reports feeling better, and has a RX antiemetic at home to take if needed. Assessment and Plan   1. Nausea/vomiting in pregnancy    Fetal kick counts Labor precautions Return to MAU as needed  Follow-up Information   Follow up with HARPER,CHARLES A, MD. (As scheduled)    Specialty:  Obstetrics and Gynecology   Contact information:   [address] [place] [address] [phone]       Pamelia Hoit 01/28/2014, 4:08 PM

## 2014-01-28 NOTE — Progress Notes (Signed)
Pulse: 139 Patient states she is having lower abdominal and pelvic pain and pressure. Patient states contractions have been pretty regular today. Patient states that in the office is the second time she has thrown up today and it has looked like blood. Patient states she took her medicine she does not know why it is not working.

## 2014-01-28 NOTE — Discharge Instructions (Signed)
Fetal Movement Counts Patient Name: __________________________________________________ Patient Due Date: ____________________ Performing a fetal movement count is highly recommended in high-risk pregnancies, but it is good for every pregnant woman to do. Your caregiver may ask you to start counting fetal movements at 28 weeks of the pregnancy. Fetal movements often increase:  After eating a full meal.  After physical activity.  After eating or drinking something sweet or cold.  At rest. Pay attention to when you feel the baby is most active. This will help you notice a pattern of your baby's sleep and wake cycles and what factors contribute to an increase in fetal movement. It is important to perform a fetal movement count at the same time each day when your baby is normally most active.  HOW TO COUNT FETAL MOVEMENTS 1. Find a quiet and comfortable area to sit or lie down on your left side. Lying on your left side provides the best blood and oxygen circulation to your baby. 2. Write down the day and time on a sheet of paper or in a journal. 3. Start counting kicks, flutters, swishes, rolls, or jabs in a 2 hour period. You should feel at least 10 movements within 2 hours. 4. If you do not feel 10 movements in 2 hours, wait 2 3 hours and count again. Look for a change in the pattern or not enough counts in 2 hours. SEEK MEDICAL CARE IF:  You feel less than 10 counts in 2 hours, tried twice.  There is no movement in over an hour.  The pattern is changing or taking longer each day to reach 10 counts in 2 hours.  You feel the baby is not moving as he or she usually does. Date: ____________ Movements: ____________ Start time: ____________ Finish time: ____________  Date: ____________ Movements: ____________ Start time: ____________ Finish time: ____________ Date: ____________ Movements: ____________ Start time: ____________ Finish time: ____________ Date: ____________ Movements: ____________  Start time: ____________ Finish time: ____________ Date: ____________ Movements: ____________ Start time: ____________ Finish time: ____________ Date: ____________ Movements: ____________ Start time: ____________ Finish time: ____________ Date: ____________ Movements: ____________ Start time: ____________ Finish time: ____________ Date: ____________ Movements: ____________ Start time: ____________ Finish time: ____________  Date: ____________ Movements: ____________ Start time: ____________ Finish time: ____________ Date: ____________ Movements: ____________ Start time: ____________ Finish time: ____________ Date: ____________ Movements: ____________ Start time: ____________ Finish time: ____________ Date: ____________ Movements: ____________ Start time: ____________ Finish time: ____________ Date: ____________ Movements: ____________ Start time: ____________ Finish time: ____________ Date: ____________ Movements: ____________ Start time: ____________ Finish time: ____________ Date: ____________ Movements: ____________ Start time: ____________ Finish time: ____________  Date: ____________ Movements: ____________ Start time: ____________ Finish time: ____________ Date: ____________ Movements: ____________ Start time: ____________ Finish time: ____________ Date: ____________ Movements: ____________ Start time: ____________ Finish time: ____________ Date: ____________ Movements: ____________ Start time: ____________ Finish time: ____________ Date: ____________ Movements: ____________ Start time: ____________ Finish time: ____________ Date: ____________ Movements: ____________ Start time: ____________ Finish time: ____________ Date: ____________ Movements: ____________ Start time: ____________ Finish time: ____________  Date: ____________ Movements: ____________ Start time: ____________ Finish time: ____________ Date: ____________ Movements: ____________ Start time: ____________ Finish time:  ____________ Date: ____________ Movements: ____________ Start time: ____________ Finish time: ____________ Date: ____________ Movements: ____________ Start time: ____________ Finish time: ____________ Date: ____________ Movements: ____________ Start time: ____________ Finish time: ____________ Date: ____________ Movements: ____________ Start time: ____________ Finish time: ____________ Date: ____________ Movements: ____________ Start time: ____________ Finish time: ____________  Date: ____________ Movements: ____________ Start time: ____________ Finish   time: ____________ Date: ____________ Movements: ____________ Start time: ____________ Finish time: ____________ Date: ____________ Movements: ____________ Start time: ____________ Finish time: ____________ Date: ____________ Movements: ____________ Start time: ____________ Finish time: ____________ Date: ____________ Movements: ____________ Start time: ____________ Finish time: ____________ Date: ____________ Movements: ____________ Start time: ____________ Finish time: ____________ Date: ____________ Movements: ____________ Start time: ____________ Finish time: ____________  Date: ____________ Movements: ____________ Start time: ____________ Finish time: ____________ Date: ____________ Movements: ____________ Start time: ____________ Finish time: ____________ Date: ____________ Movements: ____________ Start time: ____________ Finish time: ____________ Date: ____________ Movements: ____________ Start time: ____________ Finish time: ____________ Date: ____________ Movements: ____________ Start time: ____________ Finish time: ____________ Date: ____________ Movements: ____________ Start time: ____________ Finish time: ____________ Date: ____________ Movements: ____________ Start time: ____________ Finish time: ____________  Date: ____________ Movements: ____________ Start time: ____________ Finish time: ____________ Date: ____________ Movements:  ____________ Start time: ____________ Finish time: ____________ Date: ____________ Movements: ____________ Start time: ____________ Finish time: ____________ Date: ____________ Movements: ____________ Start time: ____________ Finish time: ____________ Date: ____________ Movements: ____________ Start time: ____________ Finish time: ____________ Date: ____________ Movements: ____________ Start time: ____________ Finish time: ____________ Date: ____________ Movements: ____________ Start time: ____________ Finish time: ____________  Date: ____________ Movements: ____________ Start time: ____________ Finish time: ____________ Date: ____________ Movements: ____________ Start time: ____________ Finish time: ____________ Date: ____________ Movements: ____________ Start time: ____________ Finish time: ____________ Date: ____________ Movements: ____________ Start time: ____________ Finish time: ____________ Date: ____________ Movements: ____________ Start time: ____________ Finish time: ____________ Date: ____________ Movements: ____________ Start time: ____________ Finish time: ____________ Document Released: 11/22/2006 Document Revised: 10/09/2012 Document Reviewed: 08/19/2012 [employer] Patient Information 2014 [employer].  

## 2014-01-28 NOTE — MAU Note (Addendum)
Vomiting since yesterday, sent from MD office.  States nausea med is not helping.  Denies diarrhea.  Noticing blood in emesis.  Severe pelvic pain for "a while", worse since cerclage removed @ 37 weeks.  Denies bleeding or LOF.  Lower abd cramping & pressure.  Non-reactive NST in office.  C/S scheduled for Friday.

## 2014-01-28 NOTE — Addendum Note (Signed)
Addended by: Glendell DockerKNIGHT, Quantez Schnyder on: 01/28/2014 05:57 PM   Modules accepted: Orders

## 2014-01-28 NOTE — Progress Notes (Signed)
N/V and can't keep anything down. Having regular, strong UC's. [facility]

## 2014-01-29 ENCOUNTER — Other Ambulatory Visit: Payer: Self-pay | Admitting: *Deleted

## 2014-01-30 ENCOUNTER — Encounter: Payer: Medicaid Other | Admitting: Obstetrics

## 2014-01-30 ENCOUNTER — Encounter (HOSPITAL_COMMUNITY)
Admission: RE | Admit: 2014-01-30 | Discharge: 2014-01-30 | Disposition: A | Discharge status: Home / Self Care | Payer: Medicaid Other | Source: Ambulatory Visit | Attending: Obstetrics | Admitting: Obstetrics

## 2014-01-30 ENCOUNTER — Encounter (HOSPITAL_COMMUNITY): Payer: Self-pay

## 2014-01-30 HISTORY — DX: Anxiety disorder, unspecified: F41.9

## 2014-01-30 HISTORY — DX: Vomiting, unspecified: R11.10

## 2014-01-30 HISTORY — DX: Gastro-esophageal reflux disease without esophagitis: K21.9

## 2014-01-30 LAB — TYPE AND SCREEN
ABO/RH(D): O POS
ANTIBODY SCREEN: NEGATIVE

## 2014-01-30 LAB — CBC
HCT: 29.2 % — ABNORMAL LOW (ref 36.0–46.0)
Hemoglobin: 9.3 g/dL — ABNORMAL LOW (ref 12.0–15.0)
MCH: 23.1 pg — ABNORMAL LOW (ref 26.0–34.0)
MCHC: 31.8 g/dL (ref 30.0–36.0)
MCV: 72.6 fL — ABNORMAL LOW (ref 78.0–100.0)
PLATELETS: 288 10*3/uL (ref 150–400)
RBC: 4.02 MIL/uL (ref 3.87–5.11)
RDW: 14 % (ref 11.5–15.5)
WBC: 8.7 10*3/uL (ref 4.0–10.5)

## 2014-01-30 LAB — ABO/RH: ABO/RH(D): O POS

## 2014-01-30 LAB — RPR: RPR Ser Ql: NONREACTIVE

## 2014-01-30 NOTE — Patient Instructions (Signed)
Your procedure is scheduled on: Monday, February 02, 2014  [facility]: 6:00am  Pick up the phone at the desk and dial [phone].  Call this number if you have problems the morning of surgery: [phone].  Remember: Do NOT eat food: After midnight Sunday Do NOT drink clear liquids after: After midnight Sunday Take these medicines the morning of surgery with a SIP OF WATER: Protonix  Do NOT wear jewelry (body piercing), metal hair clips/bobby pins, make-up, or nail polish. Do NOT wear lotions, powders, or perfumes.  You may wear deoderant. Do NOT shave for 48 hours prior to surgery. Do NOT bring valuables to the hospital. Contacts, dentures, or bridgework may not be worn into surgery. Leave suitcase in car.  After surgery it may be brought to your room.  For patients admitted to the hospital, checkout time is 11:00 AM the day of discharge.

## 2014-02-01 MED ORDER — DEXTROSE 5 % IV SOLN
3.0000 g | INTRAVENOUS | Status: AC
  Administered 2014-02-02: 3 g via INTRAVENOUS
  Filled 2014-02-01: qty 3000

## 2014-02-02 ENCOUNTER — Encounter (HOSPITAL_COMMUNITY): Payer: Medicaid Other | Admitting: Anesthesiology

## 2014-02-02 ENCOUNTER — Encounter (HOSPITAL_COMMUNITY)
Admission: RE | Disposition: A | Discharge status: Home / Self Care | Payer: Self-pay | Source: Ambulatory Visit | Attending: Obstetrics

## 2014-02-02 ENCOUNTER — Inpatient Hospital Stay (HOSPITAL_COMMUNITY)
Admission: RE | Admit: 2014-02-02 | Discharge: 2014-02-05 | DRG: 765 | Disposition: A | Discharge status: Home / Self Care | Payer: Medicaid Other | Source: Ambulatory Visit | Attending: Obstetrics | Admitting: Obstetrics

## 2014-02-02 ENCOUNTER — Inpatient Hospital Stay (HOSPITAL_COMMUNITY): Payer: Medicaid Other | Admitting: Anesthesiology

## 2014-02-02 ENCOUNTER — Encounter (HOSPITAL_COMMUNITY): Payer: Self-pay | Admitting: Anesthesiology

## 2014-02-02 DIAGNOSIS — O34219 Maternal care for unspecified type scar from previous cesarean delivery: Principal | ICD-10-CM | POA: Diagnosis present

## 2014-02-02 DIAGNOSIS — O3430 Maternal care for cervical incompetence, unspecified trimester: Secondary | ICD-10-CM | POA: Diagnosis present

## 2014-02-02 DIAGNOSIS — O99344 Other mental disorders complicating childbirth: Secondary | ICD-10-CM | POA: Diagnosis present

## 2014-02-02 DIAGNOSIS — IMO0002 Reserved for concepts with insufficient information to code with codable children: Secondary | ICD-10-CM | POA: Diagnosis present

## 2014-02-02 DIAGNOSIS — Z98891 History of uterine scar from previous surgery: Secondary | ICD-10-CM

## 2014-02-02 DIAGNOSIS — O99892 Other specified diseases and conditions complicating childbirth: Secondary | ICD-10-CM | POA: Diagnosis present

## 2014-02-02 DIAGNOSIS — D649 Anemia, unspecified: Secondary | ICD-10-CM | POA: Diagnosis present

## 2014-02-02 DIAGNOSIS — F411 Generalized anxiety disorder: Secondary | ICD-10-CM | POA: Diagnosis present

## 2014-02-02 DIAGNOSIS — Z22330 Carrier of Group B streptococcus: Secondary | ICD-10-CM

## 2014-02-02 DIAGNOSIS — Z87891 Personal history of nicotine dependence: Secondary | ICD-10-CM

## 2014-02-02 DIAGNOSIS — E669 Obesity, unspecified: Secondary | ICD-10-CM | POA: Diagnosis present

## 2014-02-02 DIAGNOSIS — O99214 Obesity complicating childbirth: Secondary | ICD-10-CM

## 2014-02-02 DIAGNOSIS — O9989 Other specified diseases and conditions complicating pregnancy, childbirth and the puerperium: Secondary | ICD-10-CM

## 2014-02-02 DIAGNOSIS — O9902 Anemia complicating childbirth: Secondary | ICD-10-CM | POA: Diagnosis present

## 2014-02-02 DIAGNOSIS — K219 Gastro-esophageal reflux disease without esophagitis: Secondary | ICD-10-CM | POA: Diagnosis present

## 2014-02-02 SURGERY — Surgical Case
Anesthesia: Spinal | Site: Abdomen

## 2014-02-02 MED ORDER — NALBUPHINE HCL 10 MG/ML IJ SOLN
INTRAMUSCULAR | Status: AC
  Filled 2014-02-02: qty 1

## 2014-02-02 MED ORDER — SCOPOLAMINE 1 MG/3DAYS TD PT72
1.0000 | MEDICATED_PATCH | Freq: Once | TRANSDERMAL | Status: DC
  Filled 2014-02-02: qty 1

## 2014-02-02 MED ORDER — NALBUPHINE HCL 10 MG/ML IJ SOLN
5.0000 mg | INTRAMUSCULAR | Status: DC | PRN
  Administered 2014-02-02 (×2): 10 mg via INTRAVENOUS
  Filled 2014-02-02 (×2): qty 1

## 2014-02-02 MED ORDER — SENNOSIDES-DOCUSATE SODIUM 8.6-50 MG PO TABS
2.0000 | ORAL_TABLET | ORAL | Status: DC
  Administered 2014-02-02 – 2014-02-04 (×3): 2 via ORAL
  Filled 2014-02-02 (×3): qty 2

## 2014-02-02 MED ORDER — BUPIVACAINE IN DEXTROSE 0.75-8.25 % IT SOLN
INTRATHECAL | Status: DC | PRN
  Administered 2014-02-02: 1.8 mL via INTRATHECAL

## 2014-02-02 MED ORDER — OXYTOCIN 40 UNITS IN LACTATED RINGERS INFUSION - SIMPLE MED: 62.5000 mL/h | INTRAVENOUS | Status: AC

## 2014-02-02 MED ORDER — TETANUS-DIPHTH-ACELL PERTUSSIS 5-2.5-18.5 LF-MCG/0.5 IM SUSP: 0.5000 mL | Freq: Once | INTRAMUSCULAR | Status: DC

## 2014-02-02 MED ORDER — PHENYLEPHRINE 8 MG IN D5W 100 ML (0.08MG/ML) PREMIX OPTIME
INJECTION | INTRAVENOUS | Status: DC | PRN
  Administered 2014-02-02: 60 ug/min via INTRAVENOUS

## 2014-02-02 MED ORDER — MEPERIDINE HCL 25 MG/ML IJ SOLN: 6.2500 mg | INTRAMUSCULAR | Status: DC | PRN

## 2014-02-02 MED ORDER — METOCLOPRAMIDE HCL 5 MG/ML IJ SOLN
INTRAMUSCULAR | Status: AC
  Filled 2014-02-02: qty 2

## 2014-02-02 MED ORDER — SIMETHICONE 80 MG PO CHEW
80.0000 mg | CHEWABLE_TABLET | ORAL | Status: DC
  Administered 2014-02-02 – 2014-02-04 (×3): 80 mg via ORAL
  Filled 2014-02-02 (×3): qty 1

## 2014-02-02 MED ORDER — OXYTOCIN 10 UNIT/ML IJ SOLN
INTRAMUSCULAR | Status: AC
  Filled 2014-02-02: qty 4

## 2014-02-02 MED ORDER — MORPHINE SULFATE 0.5 MG/ML IJ SOLN
INTRAMUSCULAR | Status: AC
  Filled 2014-02-02: qty 10

## 2014-02-02 MED ORDER — ONDANSETRON HCL 4 MG PO TABS
4.0000 mg | ORAL_TABLET | ORAL | Status: DC | PRN
  Administered 2014-02-04: 4 mg via ORAL
  Filled 2014-02-02: qty 1

## 2014-02-02 MED ORDER — ONDANSETRON HCL 4 MG/2ML IJ SOLN
INTRAMUSCULAR | Status: AC
  Filled 2014-02-02: qty 2

## 2014-02-02 MED ORDER — DIBUCAINE 1 % RE OINT: 1.0000 "application " | TOPICAL_OINTMENT | RECTAL | Status: DC | PRN

## 2014-02-02 MED ORDER — LACTATED RINGERS IV SOLN: INTRAVENOUS | Status: DC

## 2014-02-02 MED ORDER — ONDANSETRON HCL 4 MG/2ML IJ SOLN: 4.0000 mg | INTRAMUSCULAR | Status: DC | PRN

## 2014-02-02 MED ORDER — METOCLOPRAMIDE HCL 5 MG/ML IJ SOLN
10.0000 mg | Freq: Three times a day (TID) | INTRAMUSCULAR | Status: DC | PRN
  Administered 2014-02-02: 10 mg via INTRAVENOUS

## 2014-02-02 MED ORDER — DIPHENHYDRAMINE HCL 50 MG/ML IJ SOLN: 25.0000 mg | INTRAMUSCULAR | Status: DC | PRN

## 2014-02-02 MED ORDER — KETOROLAC TROMETHAMINE 30 MG/ML IJ SOLN
30.0000 mg | Freq: Four times a day (QID) | INTRAMUSCULAR | Status: AC | PRN
  Administered 2014-02-02: 30 mg via INTRAVENOUS

## 2014-02-02 MED ORDER — OXYCODONE-ACETAMINOPHEN 5-325 MG PO TABS
1.0000 | ORAL_TABLET | ORAL | Status: DC | PRN
  Administered 2014-02-02 (×2): 1 via ORAL
  Administered 2014-02-03 (×5): 2 via ORAL
  Administered 2014-02-04 (×3): 1 via ORAL
  Administered 2014-02-04 – 2014-02-05 (×3): 2 via ORAL
  Filled 2014-02-02 (×4): qty 2
  Filled 2014-02-02: qty 1
  Filled 2014-02-02: qty 2
  Filled 2014-02-02 (×3): qty 1
  Filled 2014-02-02 (×3): qty 2
  Filled 2014-02-02: qty 1

## 2014-02-02 MED ORDER — KETOROLAC TROMETHAMINE 30 MG/ML IJ SOLN
INTRAMUSCULAR | Status: AC
  Administered 2014-02-02: 30 mg via INTRAMUSCULAR
  Filled 2014-02-02: qty 1

## 2014-02-02 MED ORDER — DIPHENHYDRAMINE HCL 25 MG PO CAPS
25.0000 mg | ORAL_CAPSULE | ORAL | Status: DC | PRN
  Administered 2014-02-02: 25 mg via ORAL
  Filled 2014-02-02: qty 1

## 2014-02-02 MED ORDER — ONDANSETRON HCL 4 MG/2ML IJ SOLN: 4.0000 mg | Freq: Three times a day (TID) | INTRAMUSCULAR | Status: DC | PRN

## 2014-02-02 MED ORDER — ONDANSETRON HCL 4 MG/2ML IJ SOLN
INTRAMUSCULAR | Status: DC | PRN
  Administered 2014-02-02: 4 mg via INTRAVENOUS

## 2014-02-02 MED ORDER — KETOROLAC TROMETHAMINE 30 MG/ML IJ SOLN
30.0000 mg | Freq: Four times a day (QID) | INTRAMUSCULAR | Status: AC | PRN
  Administered 2014-02-02: 30 mg via INTRAMUSCULAR
  Filled 2014-02-02: qty 1

## 2014-02-02 MED ORDER — IBUPROFEN 600 MG PO TABS: 600.0000 mg | ORAL_TABLET | Freq: Four times a day (QID) | ORAL | Status: DC | PRN

## 2014-02-02 MED ORDER — ACETAMINOPHEN 500 MG PO TABS
1000.0000 mg | ORAL_TABLET | Freq: Four times a day (QID) | ORAL | Status: AC
  Administered 2014-02-02: 1000 mg via ORAL
  Filled 2014-02-02 (×2): qty 2

## 2014-02-02 MED ORDER — SCOPOLAMINE 1 MG/3DAYS TD PT72
1.0000 | MEDICATED_PATCH | Freq: Once | TRANSDERMAL | Status: DC
  Administered 2014-02-02: 1.5 mg via TRANSDERMAL

## 2014-02-02 MED ORDER — IBUPROFEN 600 MG PO TABS
600.0000 mg | ORAL_TABLET | Freq: Four times a day (QID) | ORAL | Status: DC
  Administered 2014-02-02 – 2014-02-05 (×10): 600 mg via ORAL
  Filled 2014-02-02 (×10): qty 1

## 2014-02-02 MED ORDER — LACTATED RINGERS IV SOLN
INTRAVENOUS | Status: DC
  Administered 2014-02-02 (×4): via INTRAVENOUS

## 2014-02-02 MED ORDER — NALBUPHINE HCL 10 MG/ML IJ SOLN
5.0000 mg | INTRAMUSCULAR | Status: DC | PRN
  Administered 2014-02-02 – 2014-02-03 (×2): 10 mg via SUBCUTANEOUS
  Filled 2014-02-02: qty 1

## 2014-02-02 MED ORDER — SIMETHICONE 80 MG PO CHEW
80.0000 mg | CHEWABLE_TABLET | Freq: Three times a day (TID) | ORAL | Status: DC
  Administered 2014-02-02 – 2014-02-04 (×6): 80 mg via ORAL
  Filled 2014-02-02 (×7): qty 1

## 2014-02-02 MED ORDER — MENTHOL 3 MG MT LOZG: 1.0000 | LOZENGE | OROMUCOSAL | Status: DC | PRN

## 2014-02-02 MED ORDER — DIPHENHYDRAMINE HCL 50 MG/ML IJ SOLN: 12.5000 mg | INTRAMUSCULAR | Status: DC | PRN

## 2014-02-02 MED ORDER — OXYTOCIN 10 UNIT/ML IJ SOLN
40.0000 [IU] | INTRAVENOUS | Status: DC | PRN
  Administered 2014-02-02: 40 [IU] via INTRAVENOUS

## 2014-02-02 MED ORDER — CEFAZOLIN SODIUM-DEXTROSE 2-3 GM-% IV SOLR
INTRAVENOUS | Status: AC
  Filled 2014-02-02: qty 50

## 2014-02-02 MED ORDER — WITCH HAZEL-GLYCERIN EX PADS: 1.0000 "application " | MEDICATED_PAD | CUTANEOUS | Status: DC | PRN

## 2014-02-02 MED ORDER — FENTANYL CITRATE 0.05 MG/ML IJ SOLN
INTRAMUSCULAR | Status: AC
  Filled 2014-02-02: qty 2

## 2014-02-02 MED ORDER — LANOLIN HYDROUS EX OINT: 1.0000 "application " | TOPICAL_OINTMENT | CUTANEOUS | Status: DC | PRN

## 2014-02-02 MED ORDER — NALOXONE HCL 1 MG/ML IJ SOLN
1.0000 ug/kg/h | INTRAVENOUS | Status: DC | PRN
  Filled 2014-02-02: qty 2

## 2014-02-02 MED ORDER — PROMETHAZINE HCL 25 MG/ML IJ SOLN: 6.2500 mg | INTRAMUSCULAR | Status: DC | PRN

## 2014-02-02 MED ORDER — PRENATAL MULTIVITAMIN CH
1.0000 | ORAL_TABLET | Freq: Every day | ORAL | Status: DC
  Filled 2014-02-02 (×3): qty 1

## 2014-02-02 MED ORDER — MORPHINE SULFATE (PF) 0.5 MG/ML IJ SOLN
INTRAMUSCULAR | Status: DC | PRN
  Administered 2014-02-02: .15 mg via INTRATHECAL

## 2014-02-02 MED ORDER — SODIUM CHLORIDE 0.9 % IJ SOLN: 3.0000 mL | INTRAMUSCULAR | Status: DC | PRN

## 2014-02-02 MED ORDER — DIPHENHYDRAMINE HCL 25 MG PO CAPS: 25.0000 mg | ORAL_CAPSULE | Freq: Four times a day (QID) | ORAL | Status: DC | PRN

## 2014-02-02 MED ORDER — MIDAZOLAM HCL 2 MG/2ML IJ SOLN: 0.5000 mg | Freq: Once | INTRAMUSCULAR | Status: DC | PRN

## 2014-02-02 MED ORDER — ZOLPIDEM TARTRATE 5 MG PO TABS: 5.0000 mg | ORAL_TABLET | Freq: Every evening | ORAL | Status: DC | PRN

## 2014-02-02 MED ORDER — PHENYLEPHRINE HCL 10 MG/ML IJ SOLN
INTRAMUSCULAR | Status: DC | PRN
  Administered 2014-02-02: 80 ug via INTRAVENOUS

## 2014-02-02 MED ORDER — SIMETHICONE 80 MG PO CHEW: 80.0000 mg | CHEWABLE_TABLET | ORAL | Status: DC | PRN

## 2014-02-02 MED ORDER — FENTANYL CITRATE 0.05 MG/ML IJ SOLN
INTRAMUSCULAR | Status: DC | PRN
  Administered 2014-02-02: 25 ug via INTRATHECAL

## 2014-02-02 MED ORDER — FENTANYL CITRATE 0.05 MG/ML IJ SOLN: 25.0000 ug | INTRAMUSCULAR | Status: DC | PRN

## 2014-02-02 MED ORDER — MEDROXYPROGESTERONE ACETATE 150 MG/ML IM SUSP: 150.0000 mg | INTRAMUSCULAR | Status: DC | PRN

## 2014-02-02 MED ORDER — NALOXONE HCL 0.4 MG/ML IJ SOLN: 0.4000 mg | INTRAMUSCULAR | Status: DC | PRN

## 2014-02-02 MED ORDER — SCOPOLAMINE 1 MG/3DAYS TD PT72
MEDICATED_PATCH | TRANSDERMAL | Status: AC
  Administered 2014-02-02: 1.5 mg via TRANSDERMAL
  Filled 2014-02-02: qty 1

## 2014-02-02 SURGICAL SUPPLY — 42 items
ADH SKN CLS APL DERMABOND .7 (GAUZE/BANDAGES/DRESSINGS) ×1
CANISTER WOUND CARE 500ML ATS (WOUND CARE) IMPLANT
CLAMP CORD UMBIL (MISCELLANEOUS) IMPLANT
CLOTH BEACON ORANGE TIMEOUT ST (SAFETY) ×3 IMPLANT
CONTAINER PREFILL 10% NBF 15ML (MISCELLANEOUS) ×6 IMPLANT
DERMABOND ADVANCED (GAUZE/BANDAGES/DRESSINGS) ×2
DERMABOND ADVANCED .7 DNX12 (GAUZE/BANDAGES/DRESSINGS) ×1 IMPLANT
DRAPE LG THREE QUARTER DISP (DRAPES) IMPLANT
DRSG OPSITE POSTOP 4X10 (GAUZE/BANDAGES/DRESSINGS) ×3 IMPLANT
DRSG VAC ATS LRG SENSATRAC (GAUZE/BANDAGES/DRESSINGS) IMPLANT
DRSG VAC ATS MED SENSATRAC (GAUZE/BANDAGES/DRESSINGS) IMPLANT
DRSG VAC ATS SM SENSATRAC (GAUZE/BANDAGES/DRESSINGS) IMPLANT
DURAPREP 26ML APPLICATOR (WOUND CARE) ×3 IMPLANT
ELECT REM PT RETURN 9FT ADLT (ELECTROSURGICAL) ×3
ELECTRODE REM PT RTRN 9FT ADLT (ELECTROSURGICAL) ×1 IMPLANT
EXTRACTOR VACUUM M CUP 4 TUBE (SUCTIONS) IMPLANT
EXTRACTOR VACUUM M CUP 4' TUBE (SUCTIONS)
GLOVE BIO SURGEON STRL SZ8 (GLOVE) ×6 IMPLANT
GOWN STRL REUS W/TWL LRG LVL3 (GOWN DISPOSABLE) ×6 IMPLANT
KIT ABG SYR 3ML LUER SLIP (SYRINGE) IMPLANT
NEEDLE HYPO 25X5/8 SAFETYGLIDE (NEEDLE) ×3 IMPLANT
NS IRRIG 1000ML POUR BTL (IV SOLUTION) ×3 IMPLANT
PACK C SECTION WH (CUSTOM PROCEDURE TRAY) ×3 IMPLANT
PAD OB MATERNITY 4.3X12.25 (PERSONAL CARE ITEMS) ×3 IMPLANT
RTRCTR C-SECT PINK 25CM LRG (MISCELLANEOUS) ×3 IMPLANT
STAPLER VISISTAT 35W (STAPLE) IMPLANT
SUT GUT PLAIN 0 CT-3 TAN 27 (SUTURE) IMPLANT
SUT MNCRL 0 VIOLET CTX 36 (SUTURE) ×3 IMPLANT
SUT MNCRL AB 4-0 PS2 18 (SUTURE) IMPLANT
SUT MON AB 2-0 CT1 27 (SUTURE) ×3 IMPLANT
SUT MON AB 3-0 SH 27 (SUTURE)
SUT MON AB 3-0 SH27 (SUTURE) IMPLANT
SUT MONOCRYL 0 CTX 36 (SUTURE) ×6
SUT PDS AB 0 CTX 60 (SUTURE) IMPLANT
SUT PLAIN 2 0 XLH (SUTURE) IMPLANT
SUT VIC AB 0 CTX 36 (SUTURE)
SUT VIC AB 0 CTX36XBRD ANBCTRL (SUTURE) IMPLANT
SUT VIC AB 2-0 CT1 27 (SUTURE)
SUT VIC AB 2-0 CT1 TAPERPNT 27 (SUTURE) IMPLANT
TOWEL OR 17X24 6PK STRL BLUE (TOWEL DISPOSABLE) ×3 IMPLANT
TRAY FOLEY CATH 14FR (SET/KITS/TRAYS/PACK) ×3 IMPLANT
WATER STERILE IRR 1000ML POUR (IV SOLUTION) ×3 IMPLANT

## 2014-02-02 NOTE — Anesthesia Preprocedure Evaluation (Signed)
Anesthesia Evaluation  Patient identified by MRN, date of birth, ID band Patient awake    Reviewed: Allergy & Precautions, H&P , NPO status , Patient's Chart, lab work & pertinent test results  Airway Mallampati: II      Dental   Pulmonary former smoker,  breath sounds clear to auscultation        Cardiovascular Exercise Tolerance: Good Rhythm:regular Rate:Normal     Neuro/Psych    GI/Hepatic GERD-  ,  Endo/Other  Morbid obesity  Renal/GU      Musculoskeletal   Abdominal   Peds  Hematology   Anesthesia Other Findings vertigo  Reproductive/Obstetrics (+) Pregnancy                           Anesthesia Physical Anesthesia Plan  ASA: III  Anesthesia Plan: Spinal   Post-op Pain Management:    Induction:   Airway Management Planned:   Additional Equipment:   Intra-op Plan:   Post-operative Plan:   Informed Consent: I have reviewed the patients History and Physical, chart, labs and discussed the procedure including the risks, benefits and alternatives for the proposed anesthesia with the patient or authorized representative who has indicated his/her understanding and acceptance.     Plan Discussed with: Anesthesiologist, CRNA and Surgeon  Anesthesia Plan Comments:         Anesthesia Quick Evaluation

## 2014-02-02 NOTE — Addendum Note (Signed)
Addendum created 02/02/14 1517 by Renford DillsJanet L Ovetta Bazzano, CRNA   Modules edited: Notes Section   Notes Section:  File: [id number]

## 2014-02-02 NOTE — Transfer of Care (Signed)
Immediate Anesthesia Transfer of Care Note  Patient: Samantha NapoleonRenee Guzman  Procedure(s) Performed: Procedure(s): REPEAT CESAREAN SECTION (N/A)  Patient Location: PACU  Anesthesia Type:Spinal  Level of Consciousness: awake, alert , oriented and patient cooperative  Airway & Oxygen Therapy: Patient Spontanous Breathing  Post-op Assessment: Report given to PACU RN and Post -op Vital signs reviewed and stable  Post vital signs: Reviewed and stable  Complications: No apparent anesthesia complications

## 2014-02-02 NOTE — H&P (Signed)
Samantha Guzman is a 28 y.o. female presenting for elective repeat C/S. Maternal Medical History:  Reason for admission: 28 yo G4 P1021.  EDC 02-04-14.  Presents for repeat C/S.  Fetal activity: Perceived fetal activity is normal.   Last perceived fetal movement was within the past hour.    Prenatal complications: Preterm labor.   Prenatal Complications - Diabetes: none.    OB History   Grav Para Term Preterm Abortions TAB SAB Ect Mult Living   4 1 1  0 2  2   1      Past Medical History  Diagnosis Date  . No significant past medical history   . Vertigo   . Incompetent cervix in pregnancy   . Hyperemesis   . Anxiety   . GERD (gastroesophageal reflux disease)    Past Surgical History  Procedure Laterality Date  . Cesarean section    . Cervical cerclage N/A 08/09/2013    Procedure: CERCLAGE CERVICAL;  Surgeon: Brock Badharles A Harper, MD;  Location: [facility];  Service: Gynecology;  Laterality: N/A;   Family History: family history includes Stroke in her father. Social History:  reports that she quit smoking about 4 years ago. She has never used smokeless tobacco. She reports that she does not drink alcohol or use illicit drugs.   Prenatal Transfer Tool  Maternal Diabetes: No Genetic Screening: Normal Maternal Ultrasounds/Referrals: Normal Fetal Ultrasounds or other Referrals:  None Maternal Substance Abuse:  No Significant Maternal Medications:  None Significant Maternal Lab Results:  None Other Comments:  None  Review of Systems  All other systems reviewed and are negative.      Blood pressure 131/75, pulse 103, temperature 98.1 F (36.7 C), temperature source Oral, resp. rate 22, last menstrual period 04/30/2013, SpO2 98.00%. Maternal Exam:  Abdomen: Patient reports no abdominal tenderness.   Physical Exam  Nursing note and vitals reviewed. Constitutional: She is oriented to person, place, and time. She appears well-developed and well-nourished.  HENT:  Head:  Normocephalic and atraumatic.  Eyes: Conjunctivae are normal. Pupils are equal, round, and reactive to light.  Neck: Normal range of motion. Neck supple.  Cardiovascular: Normal rate and regular rhythm.   Respiratory: Effort normal.  GI: Soft.  Musculoskeletal: Normal range of motion.  Neurological: She is alert and oriented to person, place, and time.  Skin: Skin is warm and dry.  Psychiatric: She has a normal mood and affect. Her behavior is normal. Judgment and thought content normal.    Prenatal labs: ABO, Rh: --/--/O POS, O POS (03/27 1130) Antibody: NEG (03/27 1130) Rubella: 2.64 (09/10 1206) RPR: NON REACTIVE (03/27 1130)  HBsAg: NEGATIVE (09/10 1206)  HIV: NON REACTIVE (09/10 1206)  GBS: POSITIVE (02/18 1529)   Assessment/Plan: 39 weeks.  Previous C/S.  Desires repeat C/S.   HARPER,CHARLES A 02/02/2014, 6:59 AM

## 2014-02-02 NOTE — Op Note (Signed)
Cesarean Section Procedure Note   Samantha NapoleonRenee Dehaan   02/02/2014  Indications: Scheduled Proceedure/Maternal Request   Pre-operative Diagnosis: PREV C/S.   Post-operative Diagnosis: Same   Surgeon: Coral CeoHARPER,Leyli Kevorkian A  Assistants: Francoise CeoMarshall, Bernard  Anesthesia: spinal  Procedure Details:  The patient was seen in the Holding Room. The risks, benefits, complications, treatment options, and expected outcomes were discussed with the patient. The patient concurred with the proposed plan, giving informed consent. The patient was identified as Samantha Guzman and the procedure verified as C-Section Delivery. A Time Out was held and the above information confirmed.  After induction of anesthesia, the patient was draped and prepped in the usual sterile manner. A transverse incision was made and carried down through the subcutaneous tissue to the fascia. The fascial incision was made and extended transversely. The fascia was separated from the underlying rectus tissue superiorly and inferiorly. The peritoneum was identified and entered. The peritoneal incision was extended longitudinally. The utero-vesical peritoneal reflection was incised transversely and the bladder flap was bluntly freed from the lower uterine segment. A low transverse uterine incision was made. Delivered from cephalic presentation was a 3365 gram living newborn female infant(s). APGAR (1 MIN): 8   APGAR (5 MINS): 9   APGAR (10 MINS):    A cord ph was not sent. The umbilical cord was clamped and cut cord. A sample was obtained for evaluation. The placenta was removed Intact and appeared normal.  The uterine incision was closed with running locked sutures of 1-0 Monocryl. A second imbricating layer of the same suture was placed.  Hemostasis was observed. The paracolic gutters were irrigated. The parieto peritoneum was closed in a running fashion with 2-0 Vicryl.  The fascia was then reapproximated with running sutures of 0 PDS.  The skin was  closed with staples.  Instrument, sponge, and needle counts were correct prior the abdominal closure and were correct at the conclusion of the case.    Findings:   Estimated Blood Loss:  700 ml  Total IV Fluids: 2300ml   Urine Output: 700CC OF clear urine  Specimens: @ORSPECIMEN @   Complications: no complications  Disposition: PACU - hemodynamically stable.  Maternal Condition: stable   Baby condition / location:  Couplet care / Skin to Skin    Signed: Surgeon(s): Brock Badharles A Mcclellan Demarais, MD

## 2014-02-02 NOTE — Anesthesia Postprocedure Evaluation (Signed)
  Anesthesia Post-op Note  Patient: Samantha Guzman  Procedure(s) Performed: Procedure(s): REPEAT CESAREAN SECTION (N/A)  Patient Location: Mother/Baby  Anesthesia Type:Spinal  Level of Consciousness: awake  Airway and Oxygen Therapy: Patient Spontanous Breathing  Post-op Pain: mild  Post-op Assessment: Patient's Cardiovascular Status Stable and Respiratory Function Stable  Post-op Vital Signs: stable  Complications: No apparent anesthesia complications

## 2014-02-02 NOTE — Anesthesia Postprocedure Evaluation (Signed)
  Anesthesia Post-op Note  Patient: Samantha NapoleonRenee Guzman  Procedure(s) Performed: Procedure(s): REPEAT CESAREAN SECTION (N/A)  Patient is awake, responsive, moving her legs, and has signs of resolution of her numbness. Pain and nausea are reasonably well controlled. Vital signs are stable and clinically acceptable. Oxygen saturation is clinically acceptable. There are no apparent anesthetic complications at this time. Patient is ready for discharge.

## 2014-02-02 NOTE — Anesthesia Procedure Notes (Signed)
Spinal  Patient location during procedure: OR Start time: 02/02/2014 7:30 AM Staffing Anesthesiologist: Kaine Mcquillen A. Performed by: anesthesiologist  Preanesthetic Checklist Completed: patient identified, site marked, surgical consent, pre-op evaluation, timeout performed, IV checked, risks and benefits discussed and monitors and equipment checked Spinal Block Patient position: sitting Prep: site prepped and draped and DuraPrep Patient monitoring: heart rate, cardiac monitor, continuous pulse ox and blood pressure Approach: midline Location: L4-5 Injection technique: single-shot Needle Needle type: Sprotte  Needle gauge: 24 G Needle length: 9 cm Needle insertion depth: 7 cm Assessment Sensory level: T4 Additional Notes Patient tolerated procedure well. Adequate sensory level.

## 2014-02-03 ENCOUNTER — Encounter (HOSPITAL_COMMUNITY): Payer: Self-pay | Admitting: Obstetrics

## 2014-02-03 LAB — CBC
HEMATOCRIT: 24.8 % — AB (ref 36.0–46.0)
Hemoglobin: 7.8 g/dL — ABNORMAL LOW (ref 12.0–15.0)
MCH: 22.8 pg — ABNORMAL LOW (ref 26.0–34.0)
MCHC: 31.5 g/dL (ref 30.0–36.0)
MCV: 72.5 fL — ABNORMAL LOW (ref 78.0–100.0)
Platelets: 228 10*3/uL (ref 150–400)
RBC: 3.42 MIL/uL — ABNORMAL LOW (ref 3.87–5.11)
RDW: 14.3 % (ref 11.5–15.5)
WBC: 8.5 10*3/uL (ref 4.0–10.5)

## 2014-02-03 NOTE — Progress Notes (Signed)
UR chart review completed.  

## 2014-02-03 NOTE — Progress Notes (Signed)
Post Partum Day 1 Subjective: no complaints, up ad lib, voiding, tolerating PO and + flatus  Objective: Blood pressure 111/73, pulse 66, temperature 98.2 F (36.8 C), temperature source Oral, resp. rate 18, weight 265 lb 3.4 oz (120.3 kg), last menstrual period 04/30/2013, SpO2 100.00%, unknown if currently breastfeeding.  Physical Exam:  General: alert and cooperative Lochia: appropriate Uterine Fundus: firm Incision: healing well DVT Evaluation: No evidence of DVT seen on physical exam.   Recent Labs  02/03/14 0653  HGB 7.8*  HCT 24.8*    Assessment/Plan: Contraception undecided. Bottle feeding, reviewed PP education.  Plan to d/c home on iron.   LOS: 1 day   [facility]EN, Samantha Guzman 02/03/2014, 5:07 PM

## 2014-02-04 NOTE — Progress Notes (Signed)
Subjective: Postpartum Day 2: Cesarean Delivery Patient reports tolerating PO, + flatus and no problems voiding.    Objective: Vital signs in last 24 hours: Temp:  [97.4 F (36.3 C)-98.1 F (36.7 C)] 97.4 F (36.3 C) (04/01 0600) Pulse Rate:  [68-79] 79 (04/01 0600) Resp:  [18] 18 (04/01 0600) BP: (112-119)/(72-78) 119/78 mmHg (04/01 0600) SpO2:  [96 %] 96 % (04/01 0600)  Physical Exam:  General: alert and no distress Lochia: appropriate Uterine Fundus: firm Incision: healing well DVT Evaluation: No evidence of DVT seen on physical exam.   Recent Labs  02/03/14 0653  HGB 7.8*  HCT 24.8*    Assessment/Plan: Status post Cesarean section. Doing well postoperatively.  Continue current care.  HARPER,CHARLES A 02/04/2014, 7:55 AM

## 2014-02-05 ENCOUNTER — Encounter: Payer: Medicaid Other | Admitting: Obstetrics

## 2014-02-05 DIAGNOSIS — IMO0002 Reserved for concepts with insufficient information to code with codable children: Secondary | ICD-10-CM | POA: Diagnosis present

## 2014-02-05 MED ORDER — OXYCODONE-ACETAMINOPHEN 5-325 MG PO TABS: 1.0000 | ORAL_TABLET | ORAL | Status: DC | PRN

## 2014-02-05 MED ORDER — PRENATAL MULTIVITAMIN CH: 1.0000 | ORAL_TABLET | Freq: Every day | ORAL | Status: DC

## 2014-02-05 MED ORDER — NORETHINDRONE 0.35 MG PO TABS: 1.0000 | ORAL_TABLET | Freq: Every day | ORAL | Status: DC

## 2014-02-05 MED ORDER — FERROUS SULFATE 325 (65 FE) MG PO TABS: 325.0000 mg | ORAL_TABLET | Freq: Two times a day (BID) | ORAL | Status: DC

## 2014-02-05 NOTE — Discharge Instructions (Signed)
Cesarean Section (Postpartum Care) °These discharge instructions provide you with general information on cesarean section (caesarean, cesarian, caesarian) and caring for yourself after you leave the hospital. Your caregiver may also give you specific instructions. °Please read these instructions and refer to them in the next few weeks. If you have any questions regarding these instructions, you may call the nursing unit. If you have any problems after discharge, please call your doctor. If you are unable to reach your doctor, you should seek help at the nearest Emergency Department. °ACTIVITY °· Rest as much as possible the first two weeks at home.  °· When possible, have someone help you with your household activities and the new baby for 2 to 3 weeks.  °· Limit your housework and social activity. Increase your activity gradually as your strength returns.  °· Do not climb stairs more than two or three times a day.  °· Do not lift anything heavier than your baby.  °· Follow your doctor's instructions about driving a car.  °· Limit wearing support panties or control-top hose, since relying on your own muscles helps strengthen them.  °· Ask your doctor about exercises.  °NUTRITION °· You may return to your usual diet.  °· Drink 6 to 8 glasses of fluid a day.  °· Eat a well-balanced diet. Include portions of food from the meat/protein, milk, fruit, vegetable and bread groups.  °· Keep taking your prenatal or multivitamins.  °ELIMINATION °You should return to your usual bowel function. If constipation is a problem, you may take a mild laxative such as Milk of MagnesiaÔ with your caregiver’s permission. Gradually add fruit, vegetables and bran to your diet. Make sure to increase your fluids. °HYGIENE °You may shower, wash your hair and take tub baths unless your doctor tells you otherwise. Continue peri-care until your vaginal bleeding and discharge stops. Do not douche or use tampons until your caregiver says it is  OK. °FEVER °If you feel feverish or have shaking chills, take your temperature. If your temperature is 101° F (38.3° C), or is 100.4° F (38° C) two times in a four hour period, call your doctor. The fever may indicate infection. If you call early, infection can be treated with medicines that kill germs (antibiotics). Hospitalization may be avoided. °PAIN CONTROL °You may still have mild discomfort. Only take over-the-counter or prescription medicine as directed by your caregiver. Do not take aspirin; it can cause bleeding. If the pain is not relieved by your medicine or becomes worse, call your caregiver. °INCISION CARE °Clean your cut (incision) gently with soap and water. If your caregiver says it is okay, leave the incision without a dressing unless it is draining or irritated. If you have small adhesive strips across the incision and they do not fall off within 7 days, carefully peel them off. Check the incision daily for increased redness, drainage, swelling or separation of skin. Call your caregiver if any of these happen. °VAGINAL CARE °You may have a vaginal discharge or bleeding for up to 6 weeks. If the vaginal discharge becomes bright red, bad smelling, heavy in amount, has blood clots or if you have burning or frequency when urinating, call your caregiver. °SEXUAL INTERCOURSE °It is best to follow your caregiver's advice about when you may safely resume sexual intercourse. Most women can begin to have intercourse two to three weeks after their baby's birth. °You can become pregnant before you have a period. If you decide to have sexual intercourse, you should use   birth control if you do not want to become pregnant right away. ° °BREAST CARE °If you are not breastfeeding and your breasts become tender, hard or leak milk, you may wear a firm fitting bra and apply ice to the breasts. If you are breastfeeding, wear a good support bra. Call your caregiver if you have breast pain, flu-like symptoms, fever or  hardness and reddening of your breasts. °POSTPARTUM BLUES °After the excitement of having the baby goes away, you may commonly have a period of low spirits or “blues." Discuss your feelings with your partner, family and friends. This may be caused by the changing hormone levels in your body. You may want to contact your caregiver if this is worrisome. °SEEK MEDICAL CARE IF: °· There is swelling, redness or increasing pain in the wound area.  °· Pus is coming from the wound.  °· You notice a bad smell from the wound or surgical dressing.  °· You have pain, redness and swelling from the intravenous site.  °· The wound is breaking open (the edges are not staying together).  °· You feel dizzy or feel like fainting.  °· You develop pain or bleeding when you urinate.  °· You develop diarrhea.  °· You develop nausea and vomiting.  °· You develop abnormal vaginal discharge.  °· You develop a rash.  °· You have any type of abnormal reaction or develop an allergy to your medication.  °· You need stronger pain medication for your pain.  °SEEK IMMEDIATE MEDICAL CARE: °· You develop a temperature of 101 or higher.  °· You develop abdominal pain.  °· You develop chest pain.  °· You develop shortness of breath.  °· You pass out.  °· You develop pain, swelling or redness of your leg.  °· You develop heavy vaginal bleeding with or without blood clots.  °Document Released: 07/15/2002 Document Re-Released: 04/12/2010 °ExitCare® Patient Information ©2011 [employer]. °Iron Deficiency Anemia °There are many types of anemia. Iron deficiency anemia is the most common. Iron deficiency anemia is a decrease in the number of red blood cells caused by too little iron. Without enough iron, your body does not produce enough hemoglobin. Hemoglobin is a substance in red blood cells that carries oxygen to the body's tissues. Iron deficiency anemia may leave you tired and short of breath. °CAUSES  °· Lack of iron in the diet.  °· This may be seen  in infants and children, because there is little iron in milk.  °· This may be seen in adults who do not eat enough iron-rich foods.  °· This may be seen in pregnant or breastfeeding women who do not take iron supplements. There is a much higher need for iron intake at these times.  °· Poor absorption of iron, as seen with intestinal disorders.  °· Intestinal bleeding.  °· Heavy periods.  °SYMPTOMS  °Mild anemia may not be noticeable. Symptoms may include: °· Fatigue.  °· Headache.  °· Pale skin.  °· Weakness.  °· Shortness of breath.  °· Dizziness.  °· Cold hands and feet.  °· Fast or irregular heartbeat.  °DIAGNOSIS  °Diagnosis requires a thorough evaluation and physical exam by your caregiver. °· Blood tests are generally used to confirm iron deficiency anemia.  °· Additional tests may be done to find the underlying cause of your anemia. These may include:  °· Testing for blood in the stool (fecal occult blood test).  °· A procedure to see inside the colon and rectum (  colonoscopy).  °· A procedure to see inside the esophagus and stomach (endoscopy).  °TREATMENT  °· Correcting the cause of the iron deficiency is the first step.  °· Medicines, such as oral contraceptives, can make heavy menstrual flows lighter.  °· Antibiotics and other medicines can be used to treat peptic ulcers.  °· Surgery may be needed to remove a bleeding polyp, tumor, or fibroid.  °· Often, iron supplements (ferrous sulfate) are taken.  °· For the best iron absorption, take these supplements with an empty stomach.  °· You may need to take the supplements with food if you cannot tolerate them on an empty stomach. Vitamin C improves the absorption of iron. Your caregiver may recommend taking your iron tablets with a glass of orange juice or vitamin C supplement.  °· Milk and antacids should not be taken at the same time as iron supplements. They may interfere with the absorption of iron.  °· Iron supplements can cause constipation. A stool  softener is often recommended.  °· Pregnant and breastfeeding women will need to take extra iron, because their normal diet usually will not provide the required amount.  °· Patients who cannot tolerate iron by mouth can take it through a vein (intravenously) or by an injection into the muscle.  °HOME CARE INSTRUCTIONS  °· Ask your dietitian for help with diet questions.  °· Take iron and vitamins as directed by your caregiver.  °· Eat a diet rich in iron. Eat liver, lean beef, whole-grain bread, eggs, dried fruit, and dark green leafy vegetables.  °SEEK IMMEDIATE MEDICAL CARE IF:  °· You have a fainting episode. Do not drive yourself. Call your local emergency services (911 in [place] if no other help is available.  °· You have chest pain, nausea, or vomiting.  °· You develop severe or increased shortness of breath with activities.  °· You develop weakness or increased thirst.  °· You have a rapid heartbeat.  °· You develop unexplained sweating or become lightheaded when getting up from a chair or bed.  °MAKE SURE YOU:  °· Understand these instructions.  °· Will watch your condition.  °· Will get help right away if you are not doing well or get worse.  °Document Released: 10/20/2000 Document Revised: 07/05/2011 Document Reviewed: 03/01/2010 °ExitCare® Patient Information ©2012 [employer]. °

## 2014-02-05 NOTE — Discharge Summary (Signed)
  Obstetric Discharge Summary Reason for Admission: cesarean section Prenatal Procedures: none Intrapartum Procedures: cesarean: low cervical, transverse Postpartum Procedures: none Complications-Operative and Postpartum: none  Hemoglobin  Date Value Ref Range Status  02/03/2014 7.8* 12.0 - 15.0 g/dL Final     HCT  Date Value Ref Range Status  02/03/2014 24.8* 36.0 - 46.0 % Final    Physical Exam:  General: alert Lochia: appropriate Uterine: firm Incision: clean, dry and intact DVT Evaluation: No evidence of DVT seen on physical exam.  Discharge Diagnoses: Active Problems:   S/P cesarean section   Maternal anemia complicating pregnancy, childbirth, or the puerperium   Discharge Information: Date: 02/05/2014 Activity: pelvic rest Diet: routine Medications:  Prior to Admission medications   Medication Sig Start Date End Date Taking? Authorizing Provider  ferrous sulfate 325 (65 FE) MG tablet Take 1 tablet (325 mg total) by mouth 2 (two) times daily before Guzman meal. 02/05/14   Antionette CharLisa Jackson-Moore, MD  norethindrone (ORTHO MICRONOR) 0.35 MG tablet Take 1 tablet (0.35 mg total) by mouth daily. 02/05/14   Antionette CharLisa Jackson-Moore, MD  oxyCODONE-acetaminophen (PERCOCET/ROXICET) 5-325 MG per tablet Take 1-2 tablets by mouth every 4 (four) hours as needed for severe pain (moderate - severe pain). 02/05/14   Antionette CharLisa Jackson-Moore, MD  Prenatal Vit-Fe Fumarate-FA (PRENATAL MULTIVITAMIN) TABS tablet Take 1 tablet by mouth daily at 12 noon. 02/05/14   Antionette CharLisa Jackson-Moore, MD    Condition: stable Instructions: refer to routine discharge instructions Discharge to: home Follow-up Information   Follow up with HARPER,CHARLES A, MD. Schedule an appointment as soon as possible for Guzman visit in 2 weeks.   Specialty:  Obstetrics and Gynecology   Contact information:   [address] [place] [id number] [phone]       Newborn Data:  Live born female  Birth Weight: 7 lb 6.7 oz (3365  g) APGAR: 8, 9   Home with mother.  Samantha Guzman,Samantha Guzman 02/05/2014, 8:28 AM

## 2014-02-24 ENCOUNTER — Ambulatory Visit (INDEPENDENT_AMBULATORY_CARE_PROVIDER_SITE_OTHER): Payer: Medicaid Other | Admitting: Obstetrics

## 2014-02-24 ENCOUNTER — Encounter: Payer: Self-pay | Admitting: Obstetrics

## 2014-02-24 DIAGNOSIS — Z3009 Encounter for other general counseling and advice on contraception: Secondary | ICD-10-CM

## 2014-02-24 DIAGNOSIS — F329 Major depressive disorder, single episode, unspecified: Secondary | ICD-10-CM

## 2014-02-24 DIAGNOSIS — F3289 Other specified depressive episodes: Secondary | ICD-10-CM

## 2014-02-26 ENCOUNTER — Encounter: Payer: Self-pay | Admitting: Obstetrics

## 2014-02-26 DIAGNOSIS — F3289 Other specified depressive episodes: Secondary | ICD-10-CM | POA: Insufficient documentation

## 2014-02-26 DIAGNOSIS — F329 Major depressive disorder, single episode, unspecified: Secondary | ICD-10-CM | POA: Insufficient documentation

## 2014-02-26 MED ORDER — NORETHIN ACE-ETH ESTRAD-FE 1-20 MG-MCG(24) PO TABS: 1.0000 | ORAL_TABLET | Freq: Every day | ORAL | Status: DC

## 2014-02-26 NOTE — Progress Notes (Signed)
Subjective:     Samantha Guzman is a 28 y.o. female who presents for a postpartum visit. She is 3 weeks postpartum following a low cervical transverse Cesarean section. I have fully reviewed the prenatal and intrapartum course. The delivery was at 39 gestational weeks. Outcome: repeat cesarean section, low transverse incision. Anesthesia: spinal. Postpartum course has been normal. Baby's course has been normal. Baby is feeding by bottle.  Bleeding thin lochia. Bowel function is normal. Bladder function is normal. Patient is not sexually active. Contraception method is abstinence. Postpartum depression screening: positive.  The following portions of the patient's history were reviewed and updated as appropriate: allergies, current medications, past family history, past medical history, past social history, past surgical history and problem list.  Review of Systems A comprehensive review of systems was negative except for: Behavioral/Psych: positive for depression   Objective:    BP 117/85  Pulse 102  Temp(Src) 98.2 F (36.8 C)  Ht 5\' 6"  (1.676 m)  Wt 240 lb (108.863 kg)  BMI 38.76 kg/m2  Breastfeeding? No  General:  alert and no distress PE:        Abdomen:  Incision C, D, I.  Nontender.      Extremities:  Negative for edema   Assessment:     Normal postpartum exam. Pap smear not done at today's visit.    Depression.  Pleased with counseling.  Does not want meds.   Not breastfeeding.   Contraceptive counseling done.  Plan:    1. Contraception: abstinence 2. OCP's 3. Follow up in: 4 weeks or as needed.  4. Loestrin 24 Rx.

## 2014-03-16 ENCOUNTER — Encounter: Payer: Self-pay | Admitting: Obstetrics

## 2014-03-16 ENCOUNTER — Ambulatory Visit: Payer: Medicaid Other | Admitting: Obstetrics

## 2014-03-16 ENCOUNTER — Ambulatory Visit (INDEPENDENT_AMBULATORY_CARE_PROVIDER_SITE_OTHER): Payer: Medicaid Other | Admitting: Obstetrics

## 2014-03-16 DIAGNOSIS — Z3041 Encounter for surveillance of contraceptive pills: Secondary | ICD-10-CM

## 2014-03-16 NOTE — Progress Notes (Signed)
Subjective:     Samantha NapoleonRenee Guzman is a 28 y.o. female who presents for a postpartum visit. She is 6 weeks postpartum following a low cervical transverse Cesarean section. I have fully reviewed the prenatal and intrapartum course. The delivery was at 39 gestational weeks. Outcome: repeat cesarean section, low transverse incision. Anesthesia: spinal. Postpartum course has been normal. Baby's course has been normal. Baby is feeding by bottle Rush Barer- Gerber. Bleeding no bleeding. Bowel function is normal. Bladder function is normal. Patient is not sexually active. Contraception method is OCP (estrogen/progesterone). Postpartum depression screening: negative.  The following portions of the patient's history were reviewed and updated as appropriate: allergies, current medications, past family history, past medical history, past social history, past surgical history and problem list.  Review of Systems A comprehensive review of systems was negative.   Objective:    BP 119/78  Pulse 73  Temp(Src) 98.7 F (37.1 C)  Ht 5\' 6"  (1.676 m)  Wt 250 lb (113.399 kg)  BMI 40.37 kg/m2  Breastfeeding? No  General:  alert and no distress   Breasts:  inspection negative, no nipple discharge or bleeding, no masses or nodularity palpable  Lungs: clear to auscultation bilaterally  Heart:  regular rate and rhythm, S1, S2 normal, no murmur, click, rub or gallop  Abdomen: normal findings: soft, non-tender.  Incision C, D, I.   Vulva:  normal  Vagina: normal vagina  Cervix:  no lesions  Corpus: normal size, contour, position, consistency, mobility, non-tender  Adnexa:  normal adnexa  Rectal Exam: Not performed.           Assessment:     Normal postpartum exam. Pap smear not done at today's visit.   Plan:    1. Contraception: OCP (estrogen/progesterone) 2. Continue PNV's 3. Follow up in: several months or as needed.

## 2014-03-17 ENCOUNTER — Encounter: Payer: Self-pay | Admitting: Obstetrics

## 2014-04-01 ENCOUNTER — Ambulatory Visit: Payer: Self-pay | Admitting: Obstetrics

## 2014-04-07 ENCOUNTER — Ambulatory Visit: Payer: Self-pay | Admitting: Obstetrics

## 2014-04-28 ENCOUNTER — Institutional Professional Consult (permissible substitution): Payer: Self-pay | Admitting: Obstetrics

## 2014-04-29 ENCOUNTER — Institutional Professional Consult (permissible substitution) (INDEPENDENT_AMBULATORY_CARE_PROVIDER_SITE_OTHER): Payer: Medicaid Other | Admitting: Obstetrics

## 2014-05-18 ENCOUNTER — Telehealth: Payer: Self-pay | Admitting: *Deleted

## 2014-05-18 DIAGNOSIS — B9689 Other specified bacterial agents as the cause of diseases classified elsewhere: Secondary | ICD-10-CM

## 2014-05-18 DIAGNOSIS — N760 Acute vaginitis: Principal | ICD-10-CM

## 2014-05-18 MED ORDER — METRONIDAZOLE 500 MG PO TABS
500.0000 mg | ORAL_TABLET | Freq: Two times a day (BID) | ORAL | Status: DC
Start: 2014-05-18 — End: 2016-02-03

## 2014-05-18 NOTE — Telephone Encounter (Signed)
Patient called stating she has a discharge with fishy odor and would like a prescription.  6:07 Spoke with patient- treatment for BV- sent to pharmacy- patient to call if symptoms do not improve.

## 2014-05-27 ENCOUNTER — Telehealth: Payer: Self-pay | Admitting: *Deleted

## 2014-05-27 NOTE — Telephone Encounter (Signed)
Patient called stating she started an antibiotic two weeks ago and skipped two days. Patient would like to know if she should pick up where she left off or start a new prescription because she is now having itching and irritation.   Per Erskine SquibbJane, If patient missed last two days of antibiotic she should finish it and then if she is still having symptoms she would need to come in for a visit.   Tried to contact patient LM on VM to CB at 6:00PM.

## 2014-05-29 NOTE — Telephone Encounter (Signed)
Pt had placed call to office on 05/28/14.  Return call to pt on 05/29/14.  No answer, LM on VM to call office.

## 2014-06-02 NOTE — Telephone Encounter (Signed)
No response from patient- re file note.

## 2014-06-16 IMAGING — US US OB COMP LESS 14 WK
1 series · 14 of 28 positions shown · non-contrast
Comparison: None for this gestation

CLINICAL DATA: Pregnancy, emesis

OBSTETRIC <14 WK US AND TRANSVAGINAL OB US
TECHNIQUE: Both transabdominal and transvaginal ultrasound
examinations were performed for complete evaluation of the
gestation as well as the maternal uterus, adnexal regions, and
pelvic cul-de-sac.  Transvaginal technique was performed to assess
early pregnancy.

[Series 1: us ob comp less 14 wk · 0.12mm/px · 40 acquisitions, 14 frames shown]
[im 2/40]
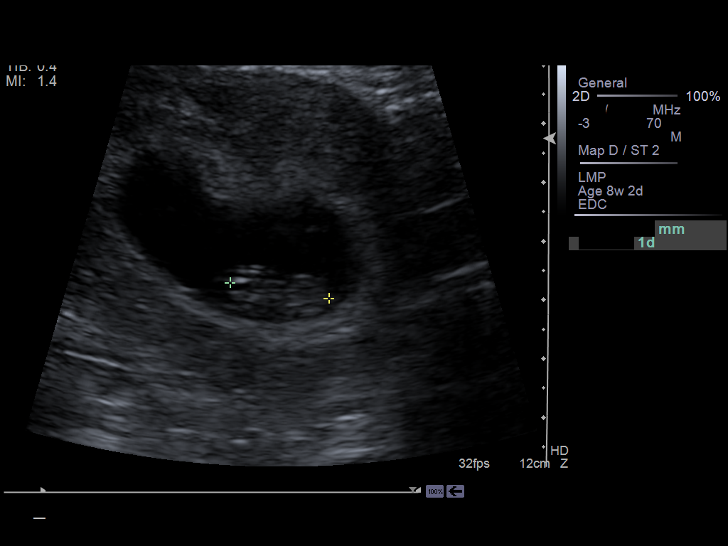
[im 5/40]
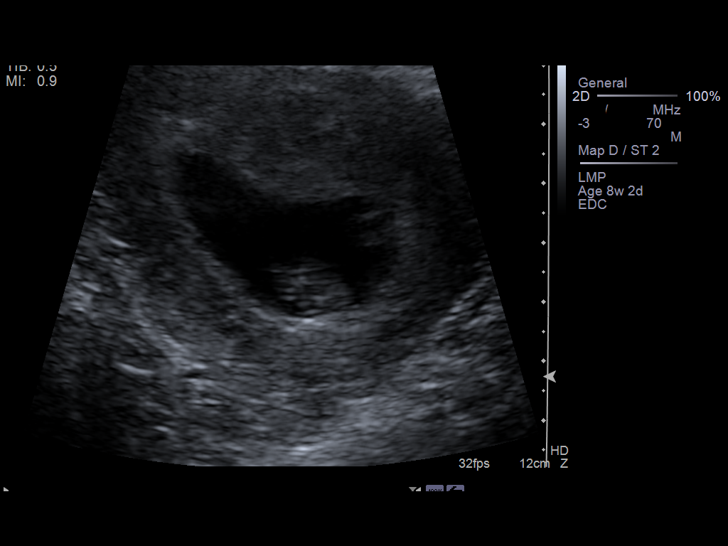
[im 8/40]
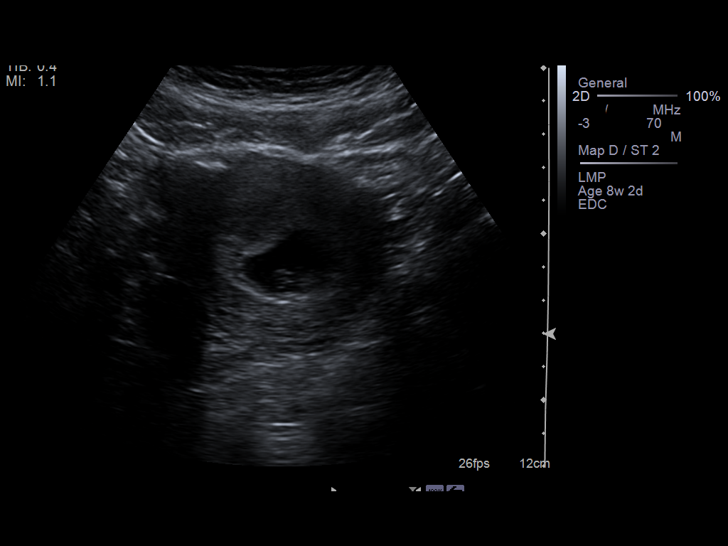
[im 11/40]
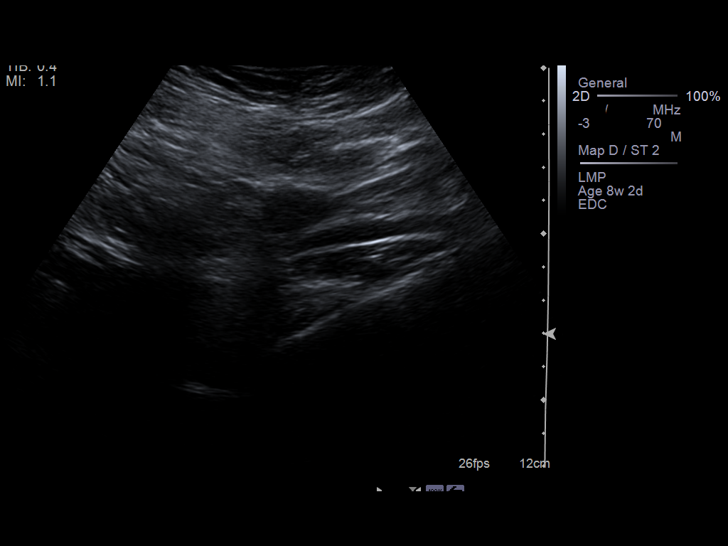
[im 14/40]
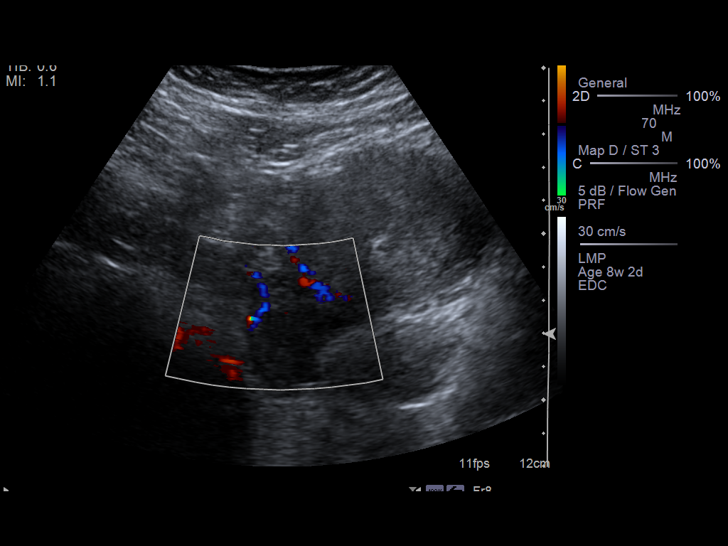
[im 16/40]
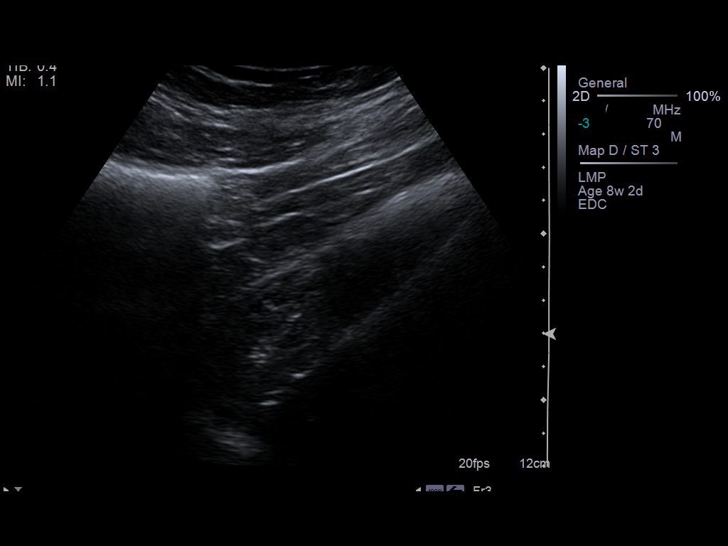
[im 19/40]
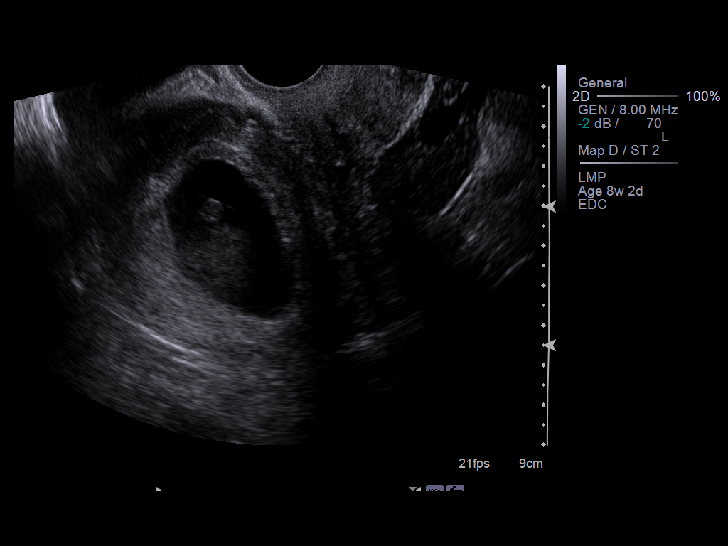
[im 22/40]
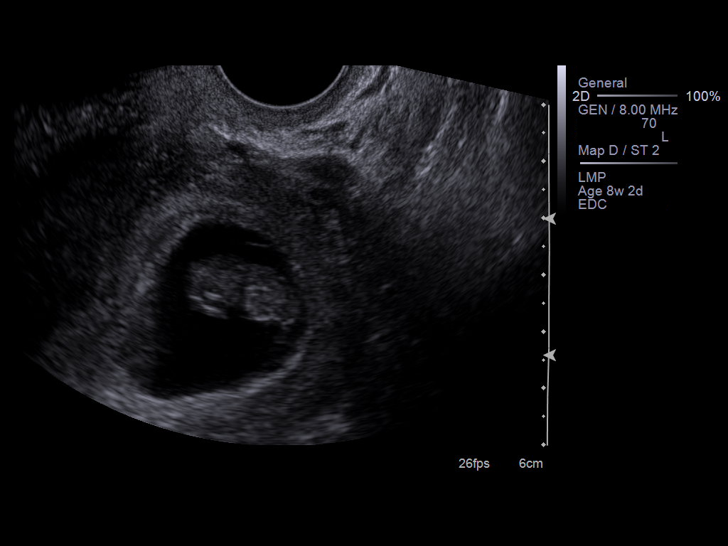
[im 25/40]
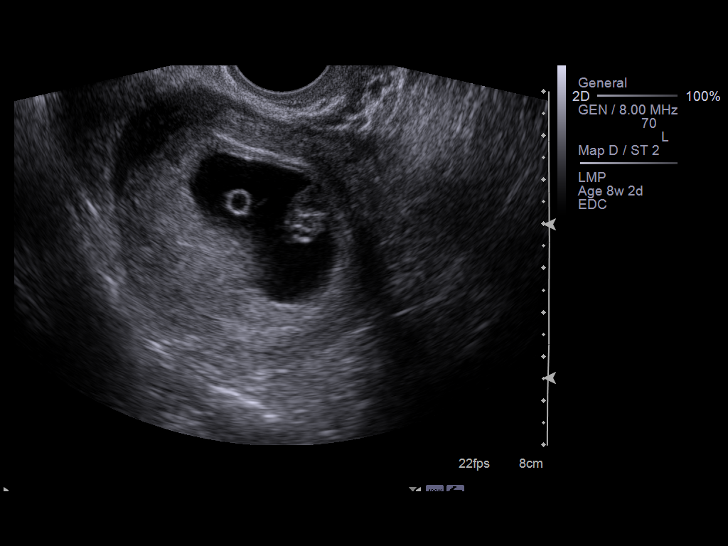
[im 28/40]
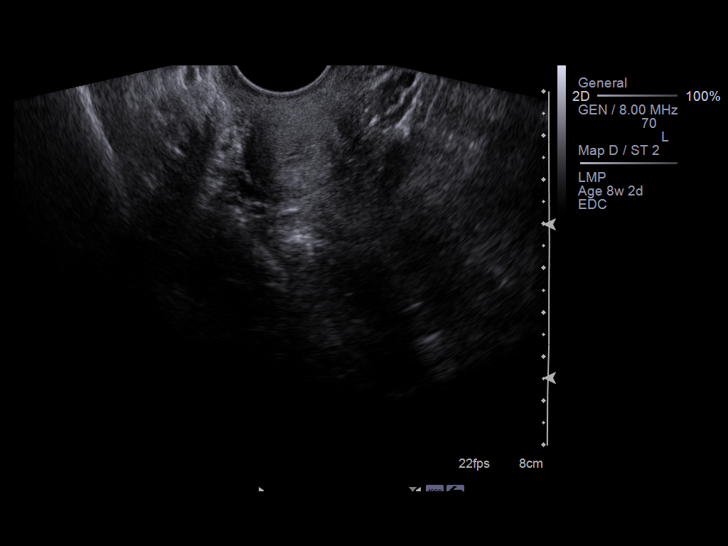
[im 31/40]
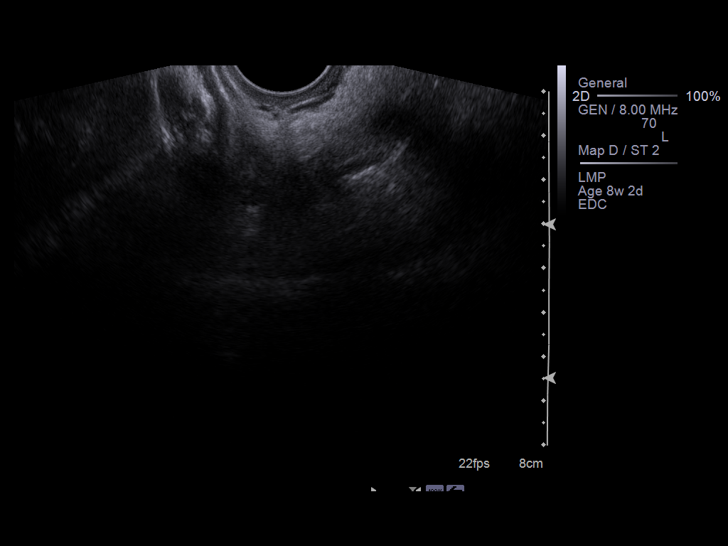
[im 34/40]
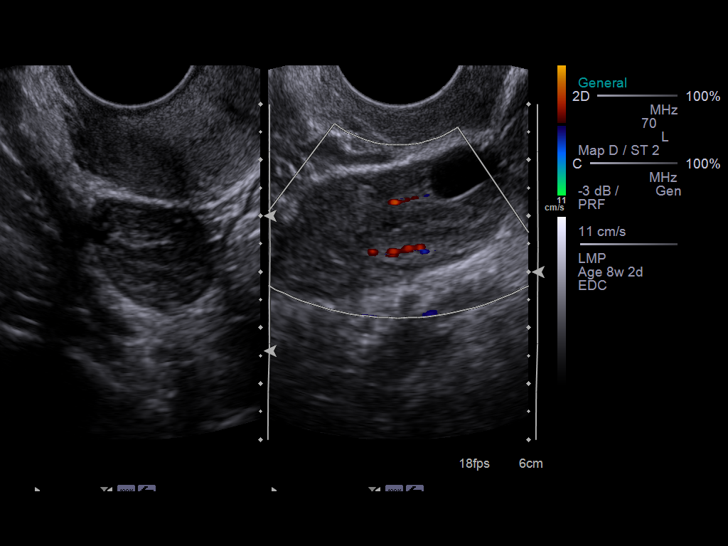
[im 37/40]
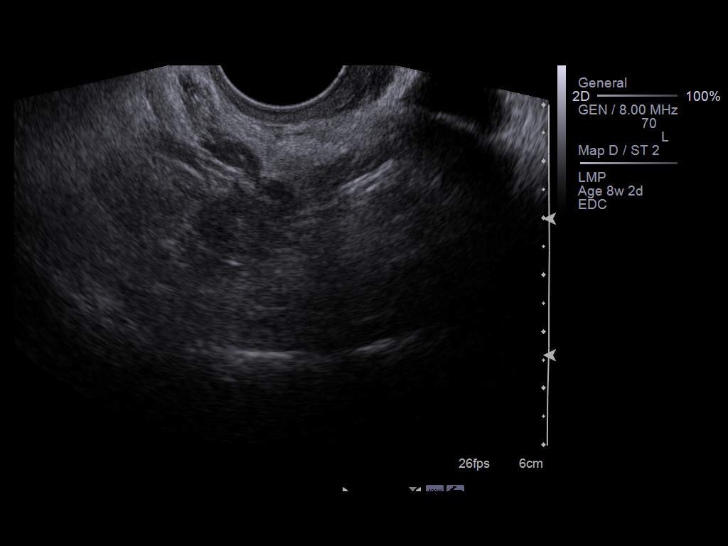
[im 40/40]
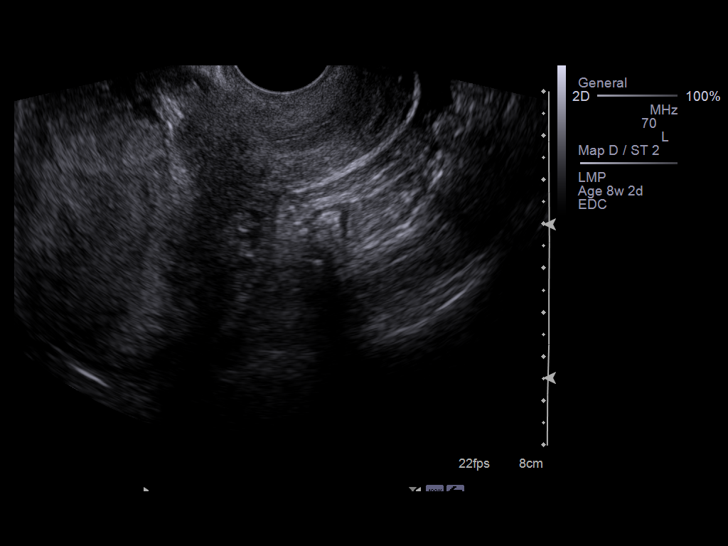

[14 of 28 positions shown; findings below may reference images not displayed]

Intrauterine gestational sac:  Visualized/normal in shape.
Yolk sac: Present
Embryo: Present
Cardiac Activity: Present
Heart Rate: 182 bpm

CRL: 17.9  mm  8 w  2 d             US EDC: 02/04/2014

Maternal uterus/adnexae:
No subchorionic hemorrhage.
Left ovary not visualized, question obscured by bowel gas.
Right ovary normal size and morphology 2.1 x 4.0 x 2.4 cm.
Trace free pelvic fluid.
No adnexal masses.
IMPRESSION: Single live early intrauterine gestation measured at 8 weeks 2 days
EGA by crown-rump length.
No acute abnormalities.

## 2014-09-07 ENCOUNTER — Encounter: Payer: Self-pay | Admitting: Obstetrics

## 2014-10-06 ENCOUNTER — Ambulatory Visit: Payer: Self-pay | Admitting: Obstetrics

## 2014-10-06 ENCOUNTER — Telehealth: Payer: Self-pay | Admitting: *Deleted

## 2014-10-06 DIAGNOSIS — B3731 Acute candidiasis of vulva and vagina: Secondary | ICD-10-CM

## 2014-10-06 DIAGNOSIS — B373 Candidiasis of vulva and vagina: Secondary | ICD-10-CM

## 2014-10-06 DIAGNOSIS — R42 Dizziness and giddiness: Secondary | ICD-10-CM

## 2014-10-06 MED ORDER — FLUCONAZOLE 150 MG PO TABS: 150.0000 mg | ORAL_TABLET | Freq: Every day | ORAL | Status: DC

## 2014-10-06 NOTE — Telephone Encounter (Signed)
Patient called requesting yeast medication for itching. Call to patient - she has an annual scheduled today- she forgot about that appointment.Patient also request a referral to primary care for some symptoms that she is having- dizziness,ect. Per Dr Clearance CootsHarper- ok to send Diflucan and do referral. Have Steward DroneBrenda call patient to reschedule her for annual next week.

## 2014-11-02 ENCOUNTER — Encounter: Payer: Self-pay | Admitting: *Deleted

## 2014-11-02 ENCOUNTER — Telehealth: Payer: Self-pay

## 2014-11-02 NOTE — Telephone Encounter (Signed)
PATIENT WANTED NAME FOR PCP - GAVE HER DR BETTI REESE - [facility] PHONE NUMBER TO CONTACT THEM.

## 2014-11-11 ENCOUNTER — Telehealth: Payer: Self-pay | Admitting: *Deleted

## 2014-11-11 DIAGNOSIS — N390 Urinary tract infection, site not specified: Secondary | ICD-10-CM

## 2014-11-11 MED ORDER — SULFAMETHOXAZOLE-TRIMETHOPRIM 800-160 MG PO TABS: 1.0000 | ORAL_TABLET | Freq: Two times a day (BID) | ORAL | Status: DC

## 2014-11-11 NOTE — Telephone Encounter (Signed)
Patient states she is have symptoms of UTI. 11/11/2013 10:53 Call to patient- she c/o frequency and pressure with urination. Bactrim DS sent to pharmacy per office protocol. Patient to call back if her symptoms do not resolve. Patient also advised to schedule her annual exam.

## 2015-05-26 ENCOUNTER — Encounter (HOSPITAL_COMMUNITY): Payer: Self-pay | Admitting: Family Medicine

## 2015-05-26 ENCOUNTER — Emergency Department (HOSPITAL_COMMUNITY)
Admission: EM | Admit: 2015-05-26 | Discharge: 2015-05-26 | Disposition: A | Discharge status: Home / Self Care | Payer: Medicaid Other | Attending: Emergency Medicine | Admitting: Emergency Medicine

## 2015-05-26 DIAGNOSIS — Z87891 Personal history of nicotine dependence: Secondary | ICD-10-CM | POA: Insufficient documentation

## 2015-05-26 DIAGNOSIS — K0889 Other specified disorders of teeth and supporting structures: Secondary | ICD-10-CM

## 2015-05-26 DIAGNOSIS — Z79899 Other long term (current) drug therapy: Secondary | ICD-10-CM | POA: Insufficient documentation

## 2015-05-26 DIAGNOSIS — Z792 Long term (current) use of antibiotics: Secondary | ICD-10-CM | POA: Insufficient documentation

## 2015-05-26 DIAGNOSIS — K088 Other specified disorders of teeth and supporting structures: Secondary | ICD-10-CM | POA: Insufficient documentation

## 2015-05-26 DIAGNOSIS — Z8659 Personal history of other mental and behavioral disorders: Secondary | ICD-10-CM | POA: Insufficient documentation

## 2015-05-26 NOTE — ED Notes (Signed)
Pt here for dental pain and decay of tooth.

## 2015-05-26 NOTE — ED Provider Notes (Signed)
CSN: [id number]     Arrival date & time 05/26/15  1602 History  This chart was scribed for Samantha LowerVrinda Saharsh Sterling, NP, working with Zadie Rhineonald Wickline, MD by Elon SpannerGarrett Cook, ED Scribe. This patient was seen in room TR06C/TR06C and the patient's care was started at 4:10 PM.   Chief Complaint  Patient presents with  . Dental Pain   HPI HPI Comments: Samantha NapoleonRenee Purdie is a 29 y.o. female who presents to the Emergency Department complaining of left upper dental pain that has been gradually worsening for several months.  The patient reports she broke a left upper tooth several months.  Since this time, that tooth, as well as the adjacent, crowned tooth, have been painful.  Patient was seen 1 week ago for this complaint where she was prescribed ibuprofen and penicillin.  She is currently taking both medications without relief.  Her primary motivator for coming to the ED today was to obtain a referral to a dentist.     Past Medical History  Diagnosis Date  . No significant past medical history   . Vertigo   . Incompetent cervix in pregnancy   . Hyperemesis   . Anxiety   . GERD (gastroesophageal reflux disease)    Past Surgical History  Procedure Laterality Date  . Cesarean section    . Cervical cerclage N/A 08/09/2013    Procedure: CERCLAGE CERVICAL;  Surgeon: Brock Badharles A Harper, MD;  Location: [facility];  Service: Gynecology;  Laterality: N/A;  . Cesarean section N/A 02/02/2014    Procedure: REPEAT CESAREAN SECTION;  Surgeon: Brock Badharles A Harper, MD;  Location: [facility];  Service: Obstetrics;  Laterality: N/A;   Family History  Problem Relation Age of Onset  . Stroke Father    History  Substance Use Topics  . Smoking status: Former Smoker    Quit date: 06/06/2009  . Smokeless tobacco: Never Used  . Alcohol Use: No   OB History    Gravida Para Term Preterm AB TAB SAB Ectopic Multiple Living   4 2 2  0 2  2   2      Review of Systems  HENT: Positive for dental problem.   All other systems reviewed and are  negative.     Allergies  Tramadol  Home Medications   Prior to Admission medications   Medication Sig Start Date End Date Taking? Authorizing Provider  fluconazole (DIFLUCAN) 150 MG tablet Take 1 tablet (150 mg total) by mouth daily. 10/06/14   Brock Badharles A Harper, MD  metroNIDAZOLE (FLAGYL) 500 MG tablet Take 1 tablet (500 mg total) by mouth 2 (two) times daily. 05/18/14   Antionette CharLisa Jackson-Moore, MD  Norethindrone Acetate-Ethinyl Estrad-FE (LOESTRIN 24 FE) 1-20 MG-MCG(24) tablet Take 1 tablet by mouth daily. 02/26/14   Brock Badharles A Harper, MD  Prenatal Vit-Fe Fumarate-FA (PRENATAL MULTIVITAMIN) TABS tablet Take 1 tablet by mouth daily at 12 noon. 02/05/14   Antionette CharLisa Jackson-Moore, MD  sulfamethoxazole-trimethoprim (BACTRIM DS,SEPTRA DS) 800-160 MG per tablet Take 1 tablet by mouth 2 (two) times daily. 11/11/14   Brock Badharles A Harper, MD   BP 137/82 mmHg  Pulse 74  Temp(Src) 98.1 F (36.7 C) (Oral)  Resp 18  SpO2 100%  LMP 05/05/2015 Physical Exam  Constitutional: She is oriented to person, place, and time. She appears well-developed and well-nourished. No distress.  HENT:  Head: Normocephalic and atraumatic.  Right Ear: External ear normal.  Left Ear: External ear normal.  Mouth/Throat:    Eyes: Conjunctivae and EOM are normal.  Neck: Neck supple. No  tracheal deviation present.  Cardiovascular: Normal rate.   Pulmonary/Chest: Effort normal. No respiratory distress.  Musculoskeletal: Normal range of motion.  Neurological: She is alert and oriented to person, place, and time.  Skin: Skin is warm and dry.  Psychiatric: She has a normal mood and affect. Her behavior is normal.  Nursing note and vitals reviewed.   ED Course  Procedures (including critical care time)  DIAGNOSTIC STUDIES: Oxygen Saturation is 100% on RA, normal by my interpretation.    COORDINATION OF CARE:  4:14 PM Discussed treatment plan with patient at bedside.  Patient acknowledges and agrees with plan.    Labs  Review Labs Reviewed - No data to display  Imaging Review No results found.   EKG Interpretation None      MDM   Final diagnoses:  Pain, dental    Pt already on pcn and ibuprofen. Pt given dental referral  I personally performed the services described in this documentation, which was scribed in my presence. The recorded information has been reviewed and is accurate.      Samantha Lower, NP 05/26/15 1622  [name], MD 05/26/15 [phone]

## 2015-05-26 NOTE — Discharge Instructions (Signed)

## 2016-02-03 ENCOUNTER — Encounter (HOSPITAL_COMMUNITY): Payer: Self-pay | Admitting: Emergency Medicine

## 2016-02-03 ENCOUNTER — Emergency Department (HOSPITAL_COMMUNITY)
Admission: EM | Admit: 2016-02-03 | Discharge: 2016-02-03 | Disposition: A | Discharge status: Home / Self Care | Payer: Medicaid Other | Attending: Emergency Medicine | Admitting: Emergency Medicine

## 2016-02-03 DIAGNOSIS — Z8719 Personal history of other diseases of the digestive system: Secondary | ICD-10-CM | POA: Diagnosis not present

## 2016-02-03 DIAGNOSIS — K0889 Other specified disorders of teeth and supporting structures: Secondary | ICD-10-CM | POA: Diagnosis present

## 2016-02-03 DIAGNOSIS — Z8659 Personal history of other mental and behavioral disorders: Secondary | ICD-10-CM | POA: Insufficient documentation

## 2016-02-03 DIAGNOSIS — Z87891 Personal history of nicotine dependence: Secondary | ICD-10-CM | POA: Insufficient documentation

## 2016-02-03 DIAGNOSIS — K029 Dental caries, unspecified: Secondary | ICD-10-CM | POA: Insufficient documentation

## 2016-02-03 MED ORDER — BUPIVACAINE-EPINEPHRINE (PF) 0.5% -1:200000 IJ SOLN
1.8000 mL | Freq: Once | INTRAMUSCULAR | Status: AC
  Administered 2016-02-03: 1.8 mL
  Filled 2016-02-03: qty 1.8

## 2016-02-03 MED ORDER — IBUPROFEN 800 MG PO TABS: 800.0000 mg | ORAL_TABLET | Freq: Three times a day (TID) | ORAL | Status: DC

## 2016-02-03 MED ORDER — OXYCODONE-ACETAMINOPHEN 5-325 MG PO TABS
1.0000 | ORAL_TABLET | Freq: Once | ORAL | Status: AC
  Administered 2016-02-03: 1 via ORAL
  Filled 2016-02-03: qty 1

## 2016-02-03 MED ORDER — PENICILLIN V POTASSIUM 500 MG PO TABS: 500.0000 mg | ORAL_TABLET | Freq: Three times a day (TID) | ORAL | Status: DC

## 2016-02-03 NOTE — Discharge Instructions (Signed)

## 2016-02-03 NOTE — ED Provider Notes (Signed)
CSN: [id number]     Arrival date & time 02/03/16  1853 History  By signing my name below, I, Marisue Humble, attest that this documentation has been prepared under the direction and in the presence of non-physician practitioner, Fayrene Helper, PA-C. Electronically Signed: Marisue Humble, Scribe. 02/03/2016. 7:59 PM.   Chief Complaint  Patient presents with  . Dental Pain   The history is provided by the patient. No language interpreter was used.   HPI Comments:  Samantha Guzman is a 30 y.o. female who presents to the Emergency Department complaining of 10/10, throbbing, waxing and waning lower dental pain for the past month, constant and worsening for the past two days. Pt has treated with Oragel, Apple Cider Vinegar, Tylenol, Ibuprofen and Aleve with no relief. She has a dentist appointment April 19 but states she is unable to wait that long due to pain intensity. Pt denies pain radiation, aggravation by temperature changes, or fever.  Past Medical History  Diagnosis Date  . No significant past medical history   . Vertigo   . Incompetent cervix in pregnancy   . Hyperemesis   . Anxiety   . GERD (gastroesophageal reflux disease)    Past Surgical History  Procedure Laterality Date  . Cesarean section    . Cervical cerclage N/A 08/09/2013    Procedure: CERCLAGE CERVICAL;  Surgeon: Brock Bad, MD;  Location: [facility];  Service: Gynecology;  Laterality: N/A;  . Cesarean section N/A 02/02/2014    Procedure: REPEAT CESAREAN SECTION;  Surgeon: Brock Bad, MD;  Location: [facility];  Service: Obstetrics;  Laterality: N/A;   Family History  Problem Relation Age of Onset  . Stroke Father    Social History  Substance Use Topics  . Smoking status: Former Smoker    Quit date: 06/06/2009  . Smokeless tobacco: Never Used  . Alcohol Use: No   OB History    Gravida Para Term Preterm AB TAB SAB Ectopic Multiple Living   0 Review of Systems  Constitutional: Negative  for fever.  HENT: Positive for dental problem.   All other systems reviewed and are negative.  Allergies  Tramadol  Home Medications   Prior to Admission medications   Medication Sig Start Date End Date Taking? Authorizing Provider  ibuprofen (ADVIL,MOTRIN) 800 MG tablet Take 1 tablet (800 mg total) by mouth 3 (three) times daily. 02/03/16   Fayrene Helper, PA-C  Norethindrone Acetate-Ethinyl Estrad-FE (LOESTRIN 24 FE) 1-20 MG-MCG(24) tablet Take 1 tablet by mouth daily. Patient not taking: Reported on 02/03/2016 02/26/14   Brock Bad, MD  penicillin v potassium (VEETID) 500 MG tablet Take 1 tablet (500 mg total) by mouth 3 (three) times daily. 02/03/16   Fayrene Helper, PA-C  Prenatal Vit-Fe Fumarate-FA (PRENATAL MULTIVITAMIN) TABS tablet Take 1 tablet by mouth daily at 12 noon. Patient not taking: Reported on 02/03/2016 02/05/14   Antionette Char, MD   BP 146/79 mmHg  Pulse 63  Temp(Src) 98.1 F (36.7 C) (Oral)  Resp 20  SpO2 100%  LMP 12/14/2015 (Exact Date) Physical Exam  Constitutional: She appears well-developed and well-nourished. No distress.  HENT:  Head: Normocephalic and atraumatic.  Dental caries noted to tooth 30 with TTP; no gingival erythema; no obvious abscess; no trismus   Eyes: Right eye exhibits no discharge. Left eye exhibits no discharge.  Pulmonary/Chest: Effort normal. No respiratory distress.  Neurological: She is alert. Coordination normal.  Skin:  No rash noted. She is not diaphoretic.  Psychiatric: She has a normal mood and affect. Her behavior is normal.  Nursing note and vitals reviewed.  ED Course  .Nerve Block Date/Time: 02/03/2016 8:17 PM Performed by: Fayrene HelperRAN, Carmen Tolliver Authorized by: Fayrene HelperRAN, Ruthanna Macchia Consent: Verbal consent obtained. Risks and benefits: risks, benefits and alternatives were discussed Consent given by: patient Patient understanding: patient states understanding of the procedure being performed Patient consent: the patient's understanding of  the procedure matches consent given Patient identity confirmed: verbally with patient Indications: pain relief Body area: face/mouth Nerve: inferior alveolar Laterality: right Patient sedated: no Preparation: Patient was prepped and draped in the usual sterile fashion. Patient position: sitting Needle gauge: 27 G Location technique: anatomical landmarks Local anesthetic: bupivacaine 0.5% with epinephrine Anesthetic total: 1.8 ml Outcome: pain improved Patient tolerance: Patient tolerated the procedure well with no immediate complications    DIAGNOSTIC STUDIES:  Oxygen Saturation is 100% on RA, normal by my interpretation.    COORDINATION OF CARE:  7:54 PM Pt requested nerve block. Will discharge with antibiotic and recommenced anti-inflammatories. Discussed treatment plan with pt at bedside and pt agreed to plan.    MDM   Final diagnoses:  Pain due to dental caries    BP 146/79 mmHg  Pulse 63  Temp(Src) 98.1 F (36.7 C) (Oral)  Resp 20  SpO2 100%  LMP 12/14/2015 (Exact Date)   I personally performed the services described in this documentation, which was scribed in my presence. The recorded information has been reviewed and is accurate.      Fayrene HelperBowie Arta Stump, PA-C 02/03/16 2018  Zadie Rhineonald Wickline, MD 02/05/16 [phone]

## 2016-02-03 NOTE — ED Notes (Signed)
Pt is c/o toothache on the right bottom  Pt states she has an open cavity  Pt states for the last month the pain has been off and on but last night the pain became intense and constant

## 2016-03-29 ENCOUNTER — Ambulatory Visit: Payer: Self-pay | Admitting: Obstetrics

## 2016-04-05 ENCOUNTER — Ambulatory Visit (INDEPENDENT_AMBULATORY_CARE_PROVIDER_SITE_OTHER): Payer: Medicaid Other | Admitting: Obstetrics

## 2016-04-05 ENCOUNTER — Encounter: Payer: Self-pay | Admitting: Obstetrics

## 2016-04-05 VITALS — BP 136/83 | HR 61 | Temp 98.3°F | Wt 268.0 lb

## 2016-04-05 DIAGNOSIS — N939 Abnormal uterine and vaginal bleeding, unspecified: Secondary | ICD-10-CM

## 2016-04-05 DIAGNOSIS — N898 Other specified noninflammatory disorders of vagina: Secondary | ICD-10-CM | POA: Diagnosis not present

## 2016-04-05 DIAGNOSIS — Z1389 Encounter for screening for other disorder: Secondary | ICD-10-CM

## 2016-04-05 DIAGNOSIS — N920 Excessive and frequent menstruation with regular cycle: Secondary | ICD-10-CM

## 2016-04-05 LAB — POCT URINALYSIS DIPSTICK
BILIRUBIN UA: NEGATIVE
Glucose, UA: NEGATIVE
KETONES UA: NEGATIVE
Leukocytes, UA: NEGATIVE
NITRITE UA: NEGATIVE
PH UA: 5
PROTEIN UA: NEGATIVE
RBC UA: NEGATIVE
Spec Grav, UA: 1.015
Urobilinogen, UA: NEGATIVE

## 2016-04-05 NOTE — Progress Notes (Signed)
Patient ID: Samantha Guzman, female   DOB: 1986-07-01, 30 y.o.   MRN: [id number]  Chief Complaint  Patient presents with  . Menstrual Problem    spotting    HPI Samantha Guzman is a 30 y.o. female.  Vaginal spotting for 4-5 days this last cycle.  No cramping.  Periods usually regular, 5 day duration, normal flow. HPI  Past Medical History  Diagnosis Date  . No significant past medical history   . Vertigo   . Incompetent cervix in pregnancy   . Hyperemesis   . Anxiety   . GERD (gastroesophageal reflux disease)     Past Surgical History  Procedure Laterality Date  . Cesarean section    . Cervical cerclage N/A 08/09/2013    Procedure: CERCLAGE CERVICAL;  Surgeon: Brock Badharles A Raymar Joiner, MD;  Location: [facility];  Service: Gynecology;  Laterality: N/A;  . Cesarean section N/A 02/02/2014    Procedure: REPEAT CESAREAN SECTION;  Surgeon: Brock Badharles A Sandy Blouch, MD;  Location: [facility];  Service: Obstetrics;  Laterality: N/A;    Family History  Problem Relation Age of Onset  . Stroke Father     Social History Social History  Substance Use Topics  . Smoking status: Former Smoker    Quit date: 06/06/2009  . Smokeless tobacco: Never Used  . Alcohol Use: No    Allergies  Allergen Reactions  . Tramadol     Tramadol caused itching.    Current Outpatient Prescriptions  Medication Sig Dispense Refill  . ibuprofen (ADVIL,MOTRIN) 800 MG tablet Take 1 tablet (800 mg total) by mouth 3 (three) times daily. 21 tablet 0   No current facility-administered medications for this visit.    Review of Systems Review of Systems Constitutional: negative for fatigue and weight loss Respiratory: negative for cough and wheezing Cardiovascular: negative for chest pain, fatigue and palpitations Gastrointestinal: negative for abdominal pain and change in bowel habits Genitourinary:positive for vaginal spotting for 4-5 days between period, only one occasion.  No cramping. Integument/breast: negative for nipple  discharge Musculoskeletal:negative for myalgias Neurological: negative for gait problems and tremors Behavioral/Psych: negative for abusive relationship, depression Endocrine: negative for temperature intolerance     Blood pressure 136/83, pulse 61, temperature 98.3 F (36.8 C), weight 268 lb (121.564 kg).  Physical Exam Physical Exam            General:  Alert and no distress Abdomen:  normal findings: no organomegaly, soft, non-tender and no hernia  Pelvis:  External genitalia: normal general appearance Urinary system: urethral meatus normal and bladder without fullness, nontender Vaginal: normal without tenderness, induration or masses Cervix: normal appearance Adnexa: normal bimanual exam Uterus: anteverted and non-tender, normal size      Data Reviewed Labs  Assessment     AUB - Hormonal Imbalance     Plan    Wet prep and cultures done F/U prn  Orders Placed This Encounter  Procedures  . POCT urinalysis dipstick   No orders of the defined types were placed in this encounter.

## 2016-04-07 ENCOUNTER — Other Ambulatory Visit: Payer: Self-pay | Admitting: Obstetrics

## 2016-04-07 DIAGNOSIS — B9689 Other specified bacterial agents as the cause of diseases classified elsewhere: Secondary | ICD-10-CM

## 2016-04-07 DIAGNOSIS — N760 Acute vaginitis: Principal | ICD-10-CM

## 2016-04-07 LAB — NUSWAB VG+, CANDIDA 6SP
ATOPOBIUM VAGINAE: HIGH {score} — AB
BVAB 2: HIGH {score} — AB
CANDIDA KRUSEI, NAA: NEGATIVE
CANDIDA TROPICALIS, NAA: NEGATIVE
CHLAMYDIA TRACHOMATIS, NAA: NEGATIVE
Candida albicans, NAA: NEGATIVE
Candida glabrata, NAA: NEGATIVE
Candida lusitaniae, NAA: NEGATIVE
Candida parapsilosis, NAA: NEGATIVE
Megasphaera 1: HIGH Score — AB
NEISSERIA GONORRHOEAE, NAA: NEGATIVE
TRICH VAG BY NAA: NEGATIVE

## 2016-04-07 MED ORDER — METRONIDAZOLE 500 MG PO TABS: 500.0000 mg | ORAL_TABLET | Freq: Two times a day (BID) | ORAL | Status: DC

## 2016-04-20 ENCOUNTER — Ambulatory Visit: Payer: Self-pay | Admitting: Obstetrics

## 2016-12-15 ENCOUNTER — Emergency Department (HOSPITAL_COMMUNITY)
Admission: EM | Admit: 2016-12-15 | Discharge: 2016-12-15 | Disposition: A | Discharge status: Home / Self Care | Payer: Medicaid Other | Attending: Emergency Medicine | Admitting: Emergency Medicine

## 2016-12-15 ENCOUNTER — Encounter (HOSPITAL_COMMUNITY): Payer: Self-pay | Admitting: Emergency Medicine

## 2016-12-15 ENCOUNTER — Emergency Department (HOSPITAL_COMMUNITY): Payer: Medicaid Other

## 2016-12-15 DIAGNOSIS — Z87891 Personal history of nicotine dependence: Secondary | ICD-10-CM | POA: Insufficient documentation

## 2016-12-15 DIAGNOSIS — E876 Hypokalemia: Secondary | ICD-10-CM | POA: Insufficient documentation

## 2016-12-15 DIAGNOSIS — R059 Cough, unspecified: Secondary | ICD-10-CM

## 2016-12-15 DIAGNOSIS — R05 Cough: Secondary | ICD-10-CM | POA: Diagnosis present

## 2016-12-15 DIAGNOSIS — R197 Diarrhea, unspecified: Secondary | ICD-10-CM | POA: Diagnosis not present

## 2016-12-15 DIAGNOSIS — J069 Acute upper respiratory infection, unspecified: Secondary | ICD-10-CM

## 2016-12-15 LAB — I-STAT BETA HCG BLOOD, ED (MC, WL, AP ONLY): I-stat hCG, quantitative: <5 m[IU]/mL (ref <5)

## 2016-12-15 LAB — CBC
HEMATOCRIT: 35.8 % — AB (ref 36.0–46.0)
Hemoglobin: 11.8 g/dL — ABNORMAL LOW (ref 12.0–15.0)
MCH: 26.5 pg (ref 26.0–34.0)
MCHC: 33 g/dL (ref 30.0–36.0)
MCV: 80.3 fL (ref 78.0–100.0)
Platelets: 263 10*3/uL (ref 150–400)
RBC: 4.46 MIL/uL (ref 3.87–5.11)
RDW: 13.8 % (ref 11.5–15.5)
WBC: 6 10*3/uL (ref 4.0–10.5)

## 2016-12-15 LAB — COMPREHENSIVE METABOLIC PANEL
ALT: 18 U/L (ref 14–54)
ANION GAP: 7 (ref 5–15)
AST: 24 U/L (ref 15–41)
Albumin: 3.9 g/dL (ref 3.5–5.0)
Alkaline Phosphatase: 82 U/L (ref 38–126)
BILIRUBIN TOTAL: 0.3 mg/dL (ref 0.3–1.2)
BUN: 10 mg/dL (ref 6–20)
CO2: 29 mmol/L (ref 22–32)
Calcium: 8.4 mg/dL — ABNORMAL LOW (ref 8.9–10.3)
Chloride: 105 mmol/L (ref 101–111)
Creatinine, Ser: 1.09 mg/dL — ABNORMAL HIGH (ref 0.44–1.00)
GFR calc Af Amer: >60 mL/min (ref >60)
Glucose, Bld: 111 mg/dL — ABNORMAL HIGH (ref 65–99)
POTASSIUM: 3.2 mmol/L — AB (ref 3.5–5.1)
Sodium: 141 mmol/L (ref 135–145)
TOTAL PROTEIN: 7.5 g/dL (ref 6.5–8.1)

## 2016-12-15 LAB — URINALYSIS, ROUTINE W REFLEX MICROSCOPIC
BACTERIA UA: NONE SEEN
BILIRUBIN URINE: NEGATIVE
GLUCOSE, UA: NEGATIVE mg/dL
Hgb urine dipstick: NEGATIVE
Ketones, ur: NEGATIVE mg/dL
Leukocytes, UA: NEGATIVE
NITRITE: NEGATIVE
Protein, ur: NEGATIVE mg/dL
SPECIFIC GRAVITY, URINE: 1.031 — AB (ref 1.005–1.030)
pH: 5 (ref 5.0–8.0)

## 2016-12-15 LAB — LIPASE, BLOOD: Lipase: 22 U/L (ref 11–51)

## 2016-12-15 MED ORDER — BENZONATATE 100 MG PO CAPS: 100.0000 mg | ORAL_CAPSULE | Freq: Three times a day (TID) | ORAL | 0 refills | Status: DC

## 2016-12-15 MED ORDER — ALBUTEROL SULFATE HFA 108 (90 BASE) MCG/ACT IN AERS
1.0000 | INHALATION_SPRAY | RESPIRATORY_TRACT | Status: DC | PRN
  Administered 2016-12-15: 2 via RESPIRATORY_TRACT
  Filled 2016-12-15: qty 6.7

## 2016-12-15 MED ORDER — ALBUTEROL SULFATE (2.5 MG/3ML) 0.083% IN NEBU: 5.0000 mg | INHALATION_SOLUTION | Freq: Once | RESPIRATORY_TRACT | Status: DC

## 2016-12-15 MED ORDER — IBUPROFEN 600 MG PO TABS
600.0000 mg | ORAL_TABLET | Freq: Four times a day (QID) | ORAL | 0 refills | Status: DC | PRN
Start: 2016-12-15 — End: 2018-04-07

## 2016-12-15 MED ORDER — POTASSIUM CHLORIDE CRYS ER 20 MEQ PO TBCR
40.0000 meq | EXTENDED_RELEASE_TABLET | Freq: Once | ORAL | Status: AC
  Administered 2016-12-15: 40 meq via ORAL
  Filled 2016-12-15: qty 2

## 2016-12-15 MED ORDER — SODIUM CHLORIDE 0.9 % IV BOLUS (SEPSIS)
1000.0000 mL | Freq: Once | INTRAVENOUS | Status: AC
  Administered 2016-12-15: 1000 mL via INTRAVENOUS

## 2016-12-15 NOTE — ED Provider Notes (Signed)
[facility]-EMERGENCY DEPT Provider Note   CSN: [id number] Arrival date & time: 12/15/16  0831     History   Chief Complaint Chief Complaint  Patient presents with  . Cough  . Diarrhea    HPI Samantha Guzman is a 31 y.o. female.  HPI patient states she's had vomiting and diarrhea that started on Tuesday. Multiple sick contacts in the family with similar symptoms. Vomiting has resolved and she had one episode of loose stool today. Starting having cough yesterday. Associated with mild sore throat. No fever or chills. States she's had several episodes of posttussive emesis. Feels like she has difficulty taking a full breath. Denies any chest pain. No new lower extremity swelling or pain. Patient states she is wanting a note saying that she is no longer infectious so that she may return to work..  Past Medical History:  Diagnosis Date  . Anxiety   . GERD (gastroesophageal reflux disease)   . Hyperemesis   . Incompetent cervix in pregnancy   . No significant past medical history   . Vertigo     Patient Active Problem List   Diagnosis Date Noted  . Depressive disorder, not elsewhere classified 02/26/2014  . Maternal anemia complicating pregnancy, childbirth, or the puerperium 02/05/2014  . S/P cesarean section 02/02/2014  . Hyperemesis gravidarum with metabolic disturbance, before 23rd week 12/03/2013  . Candidiasis of vulva and vagina 10/22/2013  . Previous cesarean section 10/01/2013  . Obesity complicating pregnancy 10/01/2013  . Nausea and vomiting in pregnancy prior to [redacted] weeks gestation 08/04/2013  . Cervical incompetence, antepartum condition or complication 07/16/2013  . Mild hyperemesis gravidarum, antepartum 07/16/2013  . Hyperemesis gravidarum 07/03/2013  . Unspecified high-risk pregnancy 06/30/2013  . No significant past medical history     Past Surgical History:  Procedure Laterality Date  . CERVICAL CERCLAGE N/A 08/09/2013   Procedure: CERCLAGE CERVICAL;  Surgeon:  Brock Bad, MD;  Location: [facility];  Service: Gynecology;  Laterality: N/A;  . CESAREAN SECTION    . CESAREAN SECTION N/A 02/02/2014   Procedure: REPEAT CESAREAN SECTION;  Surgeon: Brock Bad, MD;  Location: [facility];  Service: Obstetrics;  Laterality: N/A;    OB History    Gravida Para Term Preterm AB Living   4 2 2  0 2 2   SAB TAB Ectopic Multiple Live Births   2       2       Home Medications    Prior to Admission medications   Medication Sig Start Date End Date Taking? Authorizing Provider  guaifenesin (ROBITUSSIN) 100 MG/5ML syrup Take 200 mg by mouth 3 (three) times daily as needed for cough.   Yes Historical Provider, MD  benzonatate (TESSALON) 100 MG capsule Take 1 capsule (100 mg total) by mouth every 8 (eight) hours. 12/15/16   Loren Racer, MD  ibuprofen (ADVIL,MOTRIN) 600 MG tablet Take 1 tablet (600 mg total) by mouth every 6 (six) hours as needed. 12/15/16   Loren Racer, MD    Family History Family History  Problem Relation Age of Onset  . Stroke Father     Social History Social History  Substance Use Topics  . Smoking status: Former Smoker    Quit date: 06/06/2009  . Smokeless tobacco: Never Used  . Alcohol use No     Allergies   Tramadol   Review of Systems Review of Systems  Constitutional: Negative for chills and fever.  HENT: Positive for congestion.   Respiratory: Positive for cough and  shortness of breath.   Cardiovascular: Negative for chest pain, palpitations and leg swelling.  Gastrointestinal: Positive for diarrhea, nausea and vomiting.  Genitourinary: Negative for dysuria, flank pain, frequency and pelvic pain.  Musculoskeletal: Negative for back pain, myalgias, neck pain and neck stiffness.  Skin: Negative for rash and wound.  Neurological: Negative for dizziness, weakness, light-headedness, numbness and headaches.  All other systems reviewed and are negative.    Physical Exam Updated Vital Signs BP 149/74 (BP Location:  Left Arm)   Pulse 60   Temp 98.1 F (36.7 C) (Oral)   Resp 12   LMP 12/05/2016   SpO2 100%   Physical Exam  Constitutional: She is oriented to person, place, and time. She appears well-developed and well-nourished.  HENT:  Head: Normocephalic and atraumatic.  Mouth/Throat: Oropharynx is clear and moist. No oropharyngeal exudate.  Mildly erythematous oropharynx. Tonsillar hypertrophy or exudates.  Eyes: EOM are normal. Pupils are equal, round, and reactive to light.  Neck: Normal range of motion. Neck supple. No JVD present.  Cardiovascular: Normal rate and regular rhythm.  Exam reveals no gallop and no friction rub.   No murmur heard. Pulmonary/Chest: Effort normal and breath sounds normal. No respiratory distress. She has no wheezes. She has no rales. She exhibits no tenderness.  Prolonged expiratory phase.  Abdominal: Soft. Bowel sounds are normal. There is no tenderness. There is no rebound and no guarding.  Musculoskeletal: Normal range of motion. She exhibits no edema or tenderness.  No CVA tenderness. No lower extremity swelling or asymmetry.  Lymphadenopathy:    She has no cervical adenopathy.  Neurological: She is alert and oriented to person, place, and time.  Moves all extremities without deficit. Sensation fully intact.  Skin: Skin is warm and dry. Capillary refill takes less than 2 seconds. No rash noted. No erythema.  Psychiatric: She has a normal mood and affect. Her behavior is normal.  Nursing note and vitals reviewed.    ED Treatments / Results  Labs (all labs ordered are listed, but only abnormal results are displayed) Labs Reviewed  COMPREHENSIVE METABOLIC PANEL - Abnormal; Notable for the following:       Result Value   Potassium 3.2 (*)    Glucose, Bld 111 (*)    Creatinine, Ser 1.09 (*)    Calcium 8.4 (*)    All other components within normal limits  CBC - Abnormal; Notable for the following:    Hemoglobin 11.8 (*)    HCT 35.8 (*)    All other  components within normal limits  URINALYSIS, ROUTINE W REFLEX MICROSCOPIC - Abnormal; Notable for the following:    Specific Gravity, Urine 1.031 (*)    Squamous Epithelial / LPF 0-5 (*)    All other components within normal limits  LIPASE, BLOOD  I-STAT BETA HCG BLOOD, ED ([facility], [facility], [facility] ONLY)    EKG  EKG Interpretation None       Radiology Dg Chest 2 View  Result Date: 12/15/2016 CLINICAL DATA:  Cough, vomiting, diarrhea for 3 days EXAM: CHEST  2 VIEW COMPARISON:  None. FINDINGS: The heart size and mediastinal contours are within normal limits. Both lungs are clear. The visualized skeletal structures are unremarkable. IMPRESSION: No active cardiopulmonary disease. Electronically Signed   By: Elige KoHetal  Patel   On: 12/15/2016 12:49    Procedures Procedures (including critical care time)  Medications Ordered in ED Medications  albuterol (PROVENTIL HFA;VENTOLIN HFA) 108 (90 Base) MCG/ACT inhaler 1-2 puff (2 puffs Inhalation Given 12/15/16 1348)  sodium chloride 0.9 % bolus 1,000 mL (0 mLs Intravenous Stopped 12/15/16 1340)  potassium chloride SA (K-DUR,KLOR-CON) CR tablet 40 mEq (40 mEq Oral Given 12/15/16 1216)     Initial Impression / Assessment and Plan / ED Course  I have reviewed the triage vital signs and the nursing notes.  Pertinent labs & imaging results that were available during my care of the patient were reviewed by me and considered in my medical decision making (see chart for details).     Patient states she is feeling much better. Even oral potassium replacement. Discharge home with MDI as needed for wheezing and cough. Return precautions given.  Final Clinical Impressions(s) / ED Diagnoses   Final diagnoses:  Cough  Upper respiratory tract infection, unspecified type  Hypokalemia  Diarrhea, unspecified type    New Prescriptions Discharge Medication List as of 12/15/2016  1:38 PM    START taking these medications   Details  benzonatate (TESSALON) 100 MG capsule  Take 1 capsule (100 mg total) by mouth every 8 (eight) hours., Starting Fri 12/15/2016, Print    ibuprofen (ADVIL,MOTRIN) 600 MG tablet Take 1 tablet (600 mg total) by mouth every 6 (six) hours as needed., Starting Fri 12/15/2016, Print         Loren Racer, MD 12/15/16 1622

## 2016-12-15 NOTE — ED Triage Notes (Signed)
patient c/o cough with yellow phlegm.  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

## 2016-12-15 NOTE — ED Notes (Signed)
Pt states that she is unable to urinate at this time.

## 2018-03-14 ENCOUNTER — Ambulatory Visit (INDEPENDENT_AMBULATORY_CARE_PROVIDER_SITE_OTHER): Payer: Medicaid Other | Admitting: Obstetrics & Gynecology

## 2018-03-14 ENCOUNTER — Other Ambulatory Visit (HOSPITAL_COMMUNITY)
Admission: RE | Admit: 2018-03-14 | Discharge: 2018-03-14 | Disposition: A | Discharge status: Home / Self Care | Payer: Medicaid Other | Source: Ambulatory Visit | Attending: Obstetrics & Gynecology | Admitting: Obstetrics & Gynecology

## 2018-03-14 ENCOUNTER — Encounter: Payer: Self-pay | Admitting: Obstetrics & Gynecology

## 2018-03-14 VITALS — BP 129/80 | HR 75 | Ht 66.0 in | Wt 246.0 lb

## 2018-03-14 DIAGNOSIS — Z3A08 8 weeks gestation of pregnancy: Secondary | ICD-10-CM | POA: Diagnosis not present

## 2018-03-14 DIAGNOSIS — O0991 Supervision of high risk pregnancy, unspecified, first trimester: Secondary | ICD-10-CM | POA: Insufficient documentation

## 2018-03-14 DIAGNOSIS — Z3481 Encounter for supervision of other normal pregnancy, first trimester: Secondary | ICD-10-CM | POA: Diagnosis not present

## 2018-03-14 DIAGNOSIS — Z3687 Encounter for antenatal screening for uncertain dates: Secondary | ICD-10-CM

## 2018-03-14 MED ORDER — GLYCOPYRROLATE 1 MG PO TABS: 1.0000 mg | ORAL_TABLET | Freq: Three times a day (TID) | ORAL | 10 refills | Status: DC

## 2018-03-14 MED ORDER — DOXYLAMINE-PYRIDOXINE 10-10 MG PO TBEC: 1.0000 | DELAYED_RELEASE_TABLET | Freq: Two times a day (BID) | ORAL | 5 refills | Status: DC

## 2018-03-14 NOTE — Progress Notes (Signed)
   Samantha Guzman is a 32 y.o. year old [id number] with LMP Patient's last menstrual period was 01/14/2018. which would correlate to  [redacted]w[redacted]d weeks gestation.  She has irregular cycles menstrual cycles.   She is here today for a confirmatory initial sonogram.       FETAL ACTIVITY:          Heart rate         138          The fetus is active.          ADNEXA: The ovaries are normal.  GESTATIONAL AGE AND  BIOMETRICS:  Gestational criteria: Estimated Date of Delivery: 112/22/19 by { 7weeks 4 days  Previous Scans:0          CROWN RUMP LENGTH      13.5   mm                                                                                      AVERAGE EGA(BY THIS SCAN):  [redacted]w[redacted]d weeks  WORKING EDD( early ultrasound ):  10/27/18     TECHNICIAN COMMENTS:  Normal TVU    A copy of this report including all images has been saved and backed up to a second source for retrieval if needed. All measures and details of the anatomical scan, placentation, fluid volume and pelvic anatomy are contained in that report.  Blossom Hoops 03/14/2018 3:47 PM

## 2018-03-14 NOTE — Progress Notes (Signed)
Pt states last pap smear was about 2 years ago and it was normal. Pt c/o feeling lightheaded

## 2018-03-14 NOTE — Progress Notes (Signed)
Subjective:    Samantha Guzman is a [id number] [redacted]w[redacted]d being seen today for her first obstetrical visit.  Her obstetrical history is significant for obesity and incompetent cervix. Patient does not intend to breast feed. Pregnancy history fully reviewed.  Patient reports nausea.  Vitals:   03/14/18 1446  BP: 129/80  Pulse: 75  Weight: 246 lb (111.6 kg)    HISTORY: OB History  Gravida Para Term Preterm AB Living  0 2  SAB TAB Ectopic Multiple Live Births  0       2    # Outcome Date GA Lbr Len/2nd Weight Sex Delivery Anes PTL Lv  5 Current           4 Term 02/02/14 [redacted]w[redacted]d  7 lb 6.7 oz (3.365 kg) F CS-LTranv Spinal  LIV     Birth Comments: NBS---Normal HB FA Newborn Screen Barcode: [id number]  3 Term 04/06/10 [redacted]w[redacted]d   F CS-LTranv  Y LIV  2 Preterm           1 Preterm            Past Medical History:  Diagnosis Date  . Anxiety   . GERD (gastroesophageal reflux disease)   . Hyperemesis   . Incompetent cervix in pregnancy   . No significant past medical history   . Vertigo    Past Surgical History:  Procedure Laterality Date  . CERVICAL CERCLAGE N/A 08/09/2013   Procedure: CERCLAGE CERVICAL;  Surgeon: Brock Bad, MD;  Location: [facility];  Service: Gynecology;  Laterality: N/A;  . CESAREAN SECTION    . CESAREAN SECTION N/A 02/02/2014   Procedure: REPEAT CESAREAN SECTION;  Surgeon: Brock Bad, MD;  Location: [facility];  Service: Obstetrics;  Laterality: N/A;   Family History  Problem Relation Age of Onset  . Stroke Father      Exam    Uterus:     Pelvic Exam:    Perineum: No Hemorrhoids   Vulva: normal   Vagina:  normal mucosa   pH:    Cervix: anteverted   Adnexa: normal adnexa   Bony Pelvis: android  System: Breast:  normal appearance, no masses or tenderness   Skin: normal coloration and turgor, no rashes    Neurologic: oriented   Extremities: normal strength, tone, and muscle mass   HEENT PERRLA   Mouth/Teeth mucous membranes moist, pharynx normal  without lesions   Neck supple   Cardiovascular: regular rate and rhythm   Respiratory:  appears well, vitals normal, no respiratory distress, acyanotic, normal RR, ear and throat exam is normal, neck free of mass or lymphadenopathy, chest clear, no wheezing, crepitations, rhonchi, normal symmetric air entry   Abdomen: soft, non-tender; bowel sounds normal; no masses,  no organomegaly   Urinary: urethral meatus normal      Assessment:    Pregnancy: [id number] Patient Active Problem List   Diagnosis Date Noted  . Depressive disorder, not elsewhere classified 02/26/2014  . Maternal anemia complicating pregnancy, childbirth, or the puerperium 02/05/2014  . S/P cesarean section 02/02/2014  . Hyperemesis gravidarum with metabolic disturbance, before 23rd week 12/03/2013  . Candidiasis of vulva and vagina 10/22/2013  . Previous cesarean section 10/01/2013  . Obesity complicating pregnancy 10/01/2013  . Nausea and vomiting in pregnancy prior to [redacted] weeks gestation 08/04/2013  . Cervical incompetence, antepartum condition or complication 07/16/2013  . Mild hyperemesis gravidarum, antepartum 07/16/2013  . Hyperemesis gravidarum 07/03/2013  . Unspecified high-risk pregnancy 06/30/2013  . No  significant past medical history         Plan:     Initial labs drawn. Prenatal vitamins. Problem list reviewed and updated. Genetic Screening discussed [facility] at 12 weeks  Ultrasound discussed; fetal survey: requested.  Follow up in 6 weeks. 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

## 2018-03-15 ENCOUNTER — Encounter: Payer: Self-pay | Admitting: Certified Nurse Midwife

## 2018-03-15 LAB — CYTOLOGY - PAP: DIAGNOSIS: NEGATIVE

## 2018-03-15 LAB — OBSTETRIC PANEL
ANTIBODY SCREEN: NOT DETECTED
Basophils Absolute: 17 cells/uL (ref 0–200)
Basophils Relative: 0.2 %
EOS PCT: 0.6 %
Eosinophils Absolute: 52 cells/uL (ref 15–500)
HEMATOCRIT: 34.8 % — AB (ref 35.0–45.0)
Hemoglobin: 11.9 g/dL (ref 11.7–15.5)
Hepatitis B Surface Ag: NONREACTIVE
LYMPHS ABS: 2176 {cells}/uL (ref 850–3900)
MCH: 27 pg (ref 27.0–33.0)
MCHC: 34.2 g/dL (ref 32.0–36.0)
MCV: 79.1 fL — AB (ref 80.0–100.0)
MONOS PCT: 4.9 %
MPV: 11.5 fL (ref 7.5–12.5)
NEUTROS ABS: 5934 {cells}/uL (ref 1500–7800)
Neutrophils Relative %: 69 %
PLATELETS: 284 10*3/uL (ref 140–400)
RBC: 4.4 10*6/uL (ref 3.80–5.10)
RDW: 13.5 % (ref 11.0–15.0)
RPR Ser Ql: NONREACTIVE
Rubella: 3.04 index
Total Lymphocyte: 25.3 %
WBC mixed population: 421 cells/uL (ref 200–950)
WBC: 8.6 10*3/uL (ref 3.8–10.8)

## 2018-03-15 LAB — HEMOGLOBIN A1C
Hgb A1c MFr Bld: 5.2 %{Hb}
Mean Plasma Glucose: 103 (calc)
eAG (mmol/L): 5.7 (calc)

## 2018-03-15 LAB — HIV ANTIBODY (ROUTINE TESTING W REFLEX): HIV 1&2 Ab, 4th Generation: NONREACTIVE

## 2018-03-17 LAB — URINE CULTURE, OB REFLEX

## 2018-03-17 LAB — CULTURE, OB URINE

## 2018-03-18 LAB — GC/CHLAMYDIA PROBE AMP (~~LOC~~) NOT AT ARMC
Chlamydia: NEGATIVE
NEISSERIA GONORRHEA: NEGATIVE

## 2018-04-02 ENCOUNTER — Telehealth: Payer: Self-pay

## 2018-04-02 NOTE — Telephone Encounter (Signed)
Pt called stating that her work sent her an email saying that page 3 of her FMLA was not faxed in. I will resend the FMLA forms. Pt also c/o throwing up a lot and I offered her Diclegis and she states that she has tried a lot of different medications and none of them work. I let pt know that I would talk with the doctor and let her know what else they recommend.

## 2018-04-07 ENCOUNTER — Other Ambulatory Visit: Payer: Self-pay

## 2018-04-07 ENCOUNTER — Encounter (HOSPITAL_COMMUNITY): Payer: Self-pay | Admitting: *Deleted

## 2018-04-07 ENCOUNTER — Inpatient Hospital Stay (HOSPITAL_COMMUNITY)
Admission: AD | Admit: 2018-04-07 | Discharge: 2018-04-07 | Disposition: A | Discharge status: Home / Self Care | Payer: Medicaid Other | Source: Ambulatory Visit | Attending: Family Medicine | Admitting: Family Medicine

## 2018-04-07 DIAGNOSIS — Z3A11 11 weeks gestation of pregnancy: Secondary | ICD-10-CM | POA: Insufficient documentation

## 2018-04-07 DIAGNOSIS — O210 Mild hyperemesis gravidarum: Secondary | ICD-10-CM | POA: Diagnosis present

## 2018-04-07 DIAGNOSIS — O211 Hyperemesis gravidarum with metabolic disturbance: Secondary | ICD-10-CM

## 2018-04-07 HISTORY — DX: Unspecified infectious disease: B99.9

## 2018-04-07 HISTORY — DX: Major depressive disorder, single episode, unspecified: F32.9

## 2018-04-07 HISTORY — DX: Depression, unspecified: F32.A

## 2018-04-07 LAB — CBC
HCT: 36.6 % (ref 36.0–46.0)
Hemoglobin: 12.6 g/dL (ref 12.0–15.0)
MCH: 27.8 pg (ref 26.0–34.0)
MCHC: 34.4 g/dL (ref 30.0–36.0)
MCV: 80.8 fL (ref 78.0–100.0)
PLATELETS: 283 10*3/uL (ref 150–400)
RBC: 4.53 MIL/uL (ref 3.87–5.11)
RDW: 13 % (ref 11.5–15.5)
WBC: 10.5 10*3/uL (ref 4.0–10.5)

## 2018-04-07 LAB — COMPREHENSIVE METABOLIC PANEL
ALBUMIN: 3.9 g/dL (ref 3.5–5.0)
ALT: 12 U/L — ABNORMAL LOW (ref 14–54)
AST: 8 U/L — AB (ref 15–41)
Alkaline Phosphatase: 81 U/L (ref 38–126)
Anion gap: 12 (ref 5–15)
BUN: 6 mg/dL (ref 6–20)
CHLORIDE: 100 mmol/L — AB (ref 101–111)
CO2: 24 mmol/L (ref 22–32)
CREATININE: 0.73 mg/dL (ref 0.44–1.00)
Calcium: 9.2 mg/dL (ref 8.9–10.3)
GFR calc Af Amer: >60 mL/min (ref >60)
GFR calc non Af Amer: >60 mL/min (ref >60)
Glucose, Bld: 98 mg/dL (ref 65–99)
Potassium: 2.9 mmol/L — ABNORMAL LOW (ref 3.5–5.1)
SODIUM: 136 mmol/L (ref 135–145)
Total Bilirubin: 0.4 mg/dL (ref 0.3–1.2)
Total Protein: 8.4 g/dL — ABNORMAL HIGH (ref 6.5–8.1)

## 2018-04-07 LAB — URINALYSIS, ROUTINE W REFLEX MICROSCOPIC
Bacteria, UA: NONE SEEN
Bilirubin Urine: NEGATIVE
Glucose, UA: NEGATIVE mg/dL
Hgb urine dipstick: NEGATIVE
KETONES UR: 80 mg/dL — AB
Leukocytes, UA: NEGATIVE
Nitrite: NEGATIVE
PH: 6 (ref 5.0–8.0)
Protein, ur: 30 mg/dL — AB
Specific Gravity, Urine: 1.031 — ABNORMAL HIGH (ref 1.005–1.030)

## 2018-04-07 LAB — MAGNESIUM: Magnesium: 1.9 mg/dL (ref 1.7–2.4)

## 2018-04-07 MED ORDER — SODIUM CHLORIDE 0.9 % IV SOLN
Freq: Once | INTRAVENOUS | Status: AC
  Administered 2018-04-07: 14:00:00 via INTRAVENOUS
  Filled 2018-04-07: qty 1000

## 2018-04-07 MED ORDER — POLYETHYLENE GLYCOL 3350 17 G PO PACK: 17.0000 g | PACK | Freq: Every day | ORAL | 0 refills | Status: DC

## 2018-04-07 MED ORDER — LACTATED RINGERS IV BOLUS
500.0000 mL | Freq: Once | INTRAVENOUS | Status: AC
  Administered 2018-04-07: 13:00:00 via INTRAVENOUS

## 2018-04-07 MED ORDER — METOCLOPRAMIDE HCL 5 MG/ML IJ SOLN: 10.0000 mg | Freq: Four times a day (QID) | INTRAMUSCULAR | Status: DC

## 2018-04-07 MED ORDER — PROMETHAZINE HCL 25 MG RE SUPP: 25.0000 mg | Freq: Four times a day (QID) | RECTAL | 2 refills | Status: DC | PRN

## 2018-04-07 MED ORDER — ONDANSETRON 8 MG PO TBDP
8.0000 mg | ORAL_TABLET | Freq: Once | ORAL | Status: DC
  Filled 2018-04-07: qty 1

## 2018-04-07 MED ORDER — DEXTROSE IN LACTATED RINGERS 5 % IV SOLN
INTRAVENOUS | Status: AC
  Administered 2018-04-07: 17:00:00 via INTRAVENOUS

## 2018-04-07 MED ORDER — POTASSIUM CHLORIDE CRYS ER 20 MEQ PO TBCR: 40.0000 meq | EXTENDED_RELEASE_TABLET | Freq: Two times a day (BID) | ORAL | 0 refills | Status: DC

## 2018-04-07 MED ORDER — DOCUSATE SODIUM 100 MG PO CAPS: 100.0000 mg | ORAL_CAPSULE | Freq: Two times a day (BID) | ORAL | 0 refills | Status: DC

## 2018-04-07 MED ORDER — METOCLOPRAMIDE HCL 10 MG PO TABS: 10.0000 mg | ORAL_TABLET | Freq: Three times a day (TID) | ORAL | 1 refills | Status: DC

## 2018-04-07 MED ORDER — METOCLOPRAMIDE HCL 5 MG/ML IJ SOLN
10.0000 mg | Freq: Once | INTRAMUSCULAR | Status: AC
  Administered 2018-04-07: 10 mg via INTRAVENOUS
  Filled 2018-04-07: qty 2

## 2018-04-07 MED ORDER — ONDANSETRON HCL 40 MG/20ML IJ SOLN
8.0000 mg | Freq: Once | INTRAMUSCULAR | Status: AC
  Administered 2018-04-07: 8 mg via INTRAVENOUS
  Filled 2018-04-07: qty 4

## 2018-04-07 MED ORDER — ONDANSETRON 8 MG PO TBDP
8.0000 mg | ORAL_TABLET | Freq: Once | ORAL | Status: AC
  Administered 2018-04-07: 8 mg via ORAL
  Filled 2018-04-07: qty 1

## 2018-04-07 NOTE — Discharge Instructions (Signed)
Hypokalemia Hypokalemia means that the amount of potassium in the blood is lower than normal.Potassium is a chemical that helps regulate the amount of fluid in the body (electrolyte). It also stimulates muscle tightening (contraction) and helps nerves work properly.Normally, most of the bodys potassium is inside of cells, and only a very small amount is in the blood. Because the amount in the blood is so small, minor changes to potassium levels in the blood can be life-threatening. What are the causes? This condition may be caused by:  Antibiotic medicine.  Diarrhea or vomiting. Taking too much of a medicine that helps you have a bowel movement (laxative) can cause diarrhea and lead to hypokalemia.  Chronic kidney disease (CKD).  Medicines that help the body get rid of excess fluid (diuretics).  Eating disorders, such as bulimia.  Low magnesium levels in the body.  Sweating a lot.  What are the signs or symptoms? Symptoms of this condition include:  Weakness.  Constipation.  Fatigue.  Muscle cramps.  Mental confusion.  Skipped heartbeats or irregular heartbeat (palpitations).  Tingling or numbness.  How is this diagnosed? This condition is diagnosed with a blood test. How is this treated? Hypokalemia can be treated by taking potassium supplements by mouth or adjusting the medicines that you take. Treatment may also include eating more foods that contain a lot of potassium. If your potassium level is very low, you may need to get potassium through an IV tube in one of your veins and be monitored in the hospital. Follow these instructions at home:  Take over-the-counter and prescription medicines only as told by your health care provider. This includes vitamins and supplements.  Eat a healthy diet. A healthy diet includes fresh fruits and vegetables, whole grains, healthy fats, and lean proteins.  If instructed, eat more foods that contain a lot of potassium, such  as: ? Nuts, such as peanuts and pistachios. ? Seeds, such as sunflower seeds and pumpkin seeds. ? Peas, lentils, and lima beans. ? Whole grain and bran cereals and breads. ? Fresh fruits and vegetables, such as apricots, avocado, bananas, cantaloupe, kiwi, oranges, tomatoes, asparagus, and potatoes. ? Orange juice. ? Tomato juice. ? Red meats. ? Yogurt.  Keep all follow-up visits as told by your health care provider. This is important. Contact a health care provider if:  You have weakness that gets worse.  You feel your heart pounding or racing.  You vomit.  You have diarrhea.  You have diabetes (diabetes mellitus) and you have trouble keeping your blood sugar (glucose) in your target range. Get help right away if:  You have chest pain.  You have shortness of breath.  You have vomiting or diarrhea that lasts for more than 2 days.  You faint. This information is not intended to replace advice given to you by your health care provider. Make sure you discuss any questions you have with your health care provider. Document Released: 10/23/2005 Document Revised: 06/10/2016 Document Reviewed: 06/10/2016 Elsevier Interactive Patient Education  2018 [employer]. Hyperemesis Gravidarum Hyperemesis gravidarum is a severe form of nausea and vomiting that happens during pregnancy. Hyperemesis is worse than morning sickness. It may cause you to have nausea or vomiting all day for many days. It may keep you from eating and drinking enough food and liquids. Hyperemesis usually occurs during the first half (the first 20 weeks) of pregnancy. It often goes away once a woman is in her second half of pregnancy. However, sometimes hyperemesis continues through an entire  pregnancy. What are the causes? The cause of this condition is not known. It may be related to changes in chemicals (hormones) in the body during pregnancy, such as the high level of pregnancy hormone (human chorionic gonadotropin)  or the increase in the female sex hormone (estrogen). What are the signs or symptoms? Symptoms of this condition include:  Severe nausea and vomiting.  Nausea that does not go away.  Vomiting that does not allow you to keep any food down.  Weight loss.  Body fluid loss (dehydration).  Having no desire to eat, or not liking food that you have previously enjoyed.  How is this diagnosed? This condition may be diagnosed based on:  A physical exam.  Your medical history.  Your symptoms.  Blood tests.  Urine tests.  How is this treated? This condition may be managed with medicine. If medicines to do not help relieve nausea and vomiting, you may need to receive fluids through an IV tube at the hospital. Follow these instructions at home:  Take over-the-counter and prescription medicines only as told by your health care provider.  Avoid iron pills and multivitamins that contain iron for the first 3-4 months of pregnancy. If you take prescription iron pills, do not stop taking them unless your health care provider approves.  Take the following actions to help prevent nausea and vomiting: ? In the morning, before getting out of bed, try eating a couple of dry crackers or a piece of toast. ? Avoid foods and smells that upset your stomach. Fatty and spicy foods may make nausea worse. ? Eat 5-6 small meals a day. ? Do not drink fluids while eating meals. Drink between meals. ? Eat or suck on things that have ginger in them. Ginger can help relieve nausea. ? Avoid food preparation. The smell of food can spoil your appetite or trigger nausea.  Follow instructions from your health care provider about eating or drinking restrictions.  For snacks, eat high-protein foods, such as cheese.  Keep all follow-up and pre-birth (prenatal) visits as told by your health care provider. This is important. Contact a health care provider if:  You have pain in your abdomen.  You have a severe  headache.  You have vision problems.  You are losing weight. Get help right away if:  You cannot drink fluids without vomiting.  You vomit blood.  You have constant nausea and vomiting.  You are very weak.  You are very thirsty.  You feel dizzy.  You faint.  You have a fever or other symptoms that last for more than 2-3 days.  You have a fever and your symptoms suddenly get worse. Summary  Hyperemesis gravidarum is a severe form of nausea and vomiting that happens during pregnancy.  Making some changes to your eating habits may help relieve nausea and vomiting.  This condition may be managed with medicine.  If medicines to do not help relieve nausea and vomiting, you may need to receive fluids through an IV tube at the hospital. This information is not intended to replace advice given to you by your health care provider. Make sure you discuss any questions you have with your health care provider. Document Released: 10/23/2005 Document Revised: 06/21/2016 Document Reviewed: 06/21/2016 Elsevier Interactive Patient Education  2017 [employer]

## 2018-04-07 NOTE — MAU Note (Signed)
Ongoing vomiting.  Has lost 20 lbs in the last month.  Pain in upper abd, associated with vomiting.

## 2018-04-07 NOTE — MAU Provider Note (Addendum)
Patient Samantha Guzman is a 32 y.o. [id number] At [redacted]w[redacted]d here with complaints of both nausea and vomiting that is on-going. She denies vaginal bleeding, discharge, dysuria, HA.  Pregnancy has been otherwise uncomplicated; she receives her prenatal care at [facility].  History     CSN: [id number]  Arrival date and time: 04/07/18 1207   None     Chief Complaint  Patient presents with  . Emesis   Emesis   This is a new problem. The current episode started more than 1 month ago. The problem occurs more than 10 times per day. The problem has been unchanged. The emesis has an appearance of bile. There has been no fever. Pertinent negatives include no chills, diarrhea, dizziness or fever.   Patient states that she has been taking Diclegis every 6 hours as needed by her provider. She was also told to take Reglan as needed, which was taking regularly.  The Reglan was helping. She ran out of Reglan a few days before the 29th and that was when she started having bad nausea and vomiting.   She was seen at an Emergency Room on May 29 and was given phenergan suppositories, pills  OB History    Gravida  5   Para  4   Term  2   Preterm  2   AB  0   Living  2     SAB  0   TAB      Ectopic      Multiple      Live Births  2           Past Medical History:  Diagnosis Date  . Anxiety   . Depression    brief hx, ok now  . GERD (gastroesophageal reflux disease)   . Hyperemesis   . Incompetent cervix in pregnancy   . Infection    UTI  . No significant past medical history   . Vertigo     Past Surgical History:  Procedure Laterality Date  . CERVICAL CERCLAGE N/A 08/09/2013   Procedure: CERCLAGE CERVICAL;  Surgeon: Brock Bad, MD;  Location: [facility];  Service: Gynecology;  Laterality: N/A;  . CESAREAN SECTION    . CESAREAN SECTION N/A 02/02/2014   Procedure: REPEAT CESAREAN SECTION;  Surgeon: Brock Bad, MD;  Location: [facility];  Service: Obstetrics;  Laterality: N/A;     Family History  Problem Relation Age of Onset  . Stroke Father   . Heart disease Father     Social History   Tobacco Use  . Smoking status: Former Smoker    Types: Cigarettes    Last attempt to quit: 06/06/2009    Years since quitting: 8.8  . Smokeless tobacco: Never Used  Substance Use Topics  . Alcohol use: No  . Drug use: No    Allergies:  Allergies  Allergen Reactions  . Tramadol     Tramadol caused itching.    Medications Prior to Admission  Medication Sig Dispense Refill Last Dose  . benzonatate (TESSALON) 100 MG capsule Take 1 capsule (100 mg total) by mouth every 8 (eight) hours. (Patient not taking: Reported on 03/14/2018) 21 capsule 0 Not Taking  . Doxylamine-Pyridoxine 10-10 MG TBEC Take 1 tablet by mouth 2 (two) times daily. 60 tablet 5   . glycopyrrolate (ROBINUL) 1 MG tablet Take 1 tablet (1 mg total) by mouth 3 (three) times daily. 90 tablet 10   . guaifenesin (ROBITUSSIN) 100 MG/5ML syrup Take 200 mg by  mouth 3 (three) times daily as needed for cough.   Not Taking  . ibuprofen (ADVIL,MOTRIN) 600 MG tablet Take 1 tablet (600 mg total) by mouth every 6 (six) hours as needed. (Patient not taking: Reported on 03/14/2018) 30 tablet 0 Not Taking    Review of Systems  Constitutional: Negative.  Negative for chills and fever.  HENT: Negative.   Respiratory: Negative.   Cardiovascular: Negative.   Gastrointestinal: Positive for nausea and vomiting. Negative for diarrhea.  Genitourinary: Negative.  Negative for vaginal bleeding, vaginal discharge and vaginal pain.  Musculoskeletal: Negative.   Neurological: Negative.  Negative for dizziness.   Physical Exam   Blood pressure 130/87, pulse 97, temperature 98.3 F (36.8 C), temperature source Oral, resp. rate 18, weight 231 lb 12 oz (105.1 kg), last menstrual period 01/14/2018, SpO2 100 %.  Physical Exam  Constitutional: She appears well-developed.  HENT:  Head: Normocephalic.  Neck: Normal range of motion.   GI: Soft.  Musculoskeletal: Normal range of motion.  Neurological: She is alert.  Skin: Skin is warm and dry.    MAU Course  Procedures  MDM -CBC normal -CMP shows potassium of 2.9; magnesium normal.  -UA positive for ketones.  -FHR is 145 by Doppler   Patient had 8 mg of Zofran IV as well as LR with DF and MV; now starting to feel sick again.  Will try with Reglan. Reglan has improved symptoms; has been tolerating water.   Patient has had significant weight loss in the past month (on two different scale, so low threshhold for moving to steriod taper if patient comes back to MAU).     Assessment and Plan   1. Hyperemesis gravidarum before end of [redacted] week gestation with electrolyte imbalance    2. Patient stable for discharge with recommendation to return to MAU in 3 days if her symptoms are not improved.   3. Resume phenergan suppositories nightly, take reglan 3 times a day. Take Miralax daily, as well as colace.   4. RX for potassium supplements, increase potassium rich foods like bananas.   [name] 04/07/2018, 3:30 PM

## 2018-04-08 ENCOUNTER — Other Ambulatory Visit: Payer: Self-pay | Admitting: Student

## 2018-04-08 MED ORDER — DOCUSATE SODIUM 100 MG PO CAPS: 100.0000 mg | ORAL_CAPSULE | Freq: Two times a day (BID) | ORAL | 0 refills | Status: DC

## 2018-04-08 MED ORDER — METOCLOPRAMIDE HCL 10 MG PO TABS: 10.0000 mg | ORAL_TABLET | Freq: Three times a day (TID) | ORAL | 1 refills | Status: DC

## 2018-04-08 MED ORDER — POTASSIUM CHLORIDE CRYS ER 20 MEQ PO TBCR: 40.0000 meq | EXTENDED_RELEASE_TABLET | Freq: Two times a day (BID) | ORAL | 0 refills | Status: DC

## 2018-04-08 MED ORDER — PROMETHAZINE HCL 25 MG RE SUPP: 25.0000 mg | Freq: Four times a day (QID) | RECTAL | 2 refills | Status: DC | PRN

## 2018-04-08 MED ORDER — POLYETHYLENE GLYCOL 3350 17 G PO PACK: 17.0000 g | PACK | Freq: Every day | ORAL | 0 refills | Status: DC

## 2018-04-09 ENCOUNTER — Telehealth: Payer: Self-pay

## 2018-04-09 DIAGNOSIS — Z3480 Encounter for supervision of other normal pregnancy, unspecified trimester: Secondary | ICD-10-CM

## 2018-04-09 MED ORDER — ONDANSETRON HCL 8 MG PO TABS: 8.0000 mg | ORAL_TABLET | Freq: Three times a day (TID) | ORAL | 0 refills | Status: DC | PRN

## 2018-04-09 NOTE — Telephone Encounter (Signed)
PT wants refill on Zofran. Mariel AloeLora Clark, RN said it is okay to send this in. Rx sent.

## 2018-04-17 ENCOUNTER — Other Ambulatory Visit: Payer: Self-pay | Admitting: *Deleted

## 2018-04-17 DIAGNOSIS — Z3480 Encounter for supervision of other normal pregnancy, unspecified trimester: Secondary | ICD-10-CM

## 2018-04-17 MED ORDER — ONDANSETRON HCL 8 MG PO TABS: 8.0000 mg | ORAL_TABLET | Freq: Three times a day (TID) | ORAL | 0 refills | Status: DC | PRN

## 2018-04-18 ENCOUNTER — Ambulatory Visit (INDEPENDENT_AMBULATORY_CARE_PROVIDER_SITE_OTHER): Payer: Medicaid Other | Admitting: Obstetrics & Gynecology

## 2018-04-18 VITALS — BP 118/60 | Wt 236.0 lb

## 2018-04-18 DIAGNOSIS — Z98891 History of uterine scar from previous surgery: Secondary | ICD-10-CM

## 2018-04-18 DIAGNOSIS — O99212 Obesity complicating pregnancy, second trimester: Secondary | ICD-10-CM

## 2018-04-18 DIAGNOSIS — O3432 Maternal care for cervical incompetence, second trimester: Secondary | ICD-10-CM

## 2018-04-18 DIAGNOSIS — O3430 Maternal care for cervical incompetence, unspecified trimester: Secondary | ICD-10-CM

## 2018-04-18 DIAGNOSIS — O0992 Supervision of high risk pregnancy, unspecified, second trimester: Secondary | ICD-10-CM

## 2018-04-18 DIAGNOSIS — O99210 Obesity complicating pregnancy, unspecified trimester: Secondary | ICD-10-CM

## 2018-04-22 NOTE — Progress Notes (Signed)
   PRENATAL VISIT NOTE  Subjective:  Samantha Guzman is a 32 y.o. [id number] at 3854w1d being seen today for ongoing prenatal care.  She is currently monitored for the following issues for this high-risk pregnancy and has No significant past medical history; High-risk pregnancy; Hyperemesis gravidarum; Cervical incompetence, antepartum condition or complication; Mild hyperemesis gravidarum, antepartum; Nausea and vomiting in pregnancy prior to [redacted] weeks gestation; Previous cesarean section; Obesity complicating pregnancy; Candidiasis of vulva and vagina; Hyperemesis gravidarum with metabolic disturbance, before 23rd week; S/P cesarean section; Maternal anemia complicating pregnancy, childbirth, or the puerperium; Depressive disorder, not elsewhere classified; and Obesity in pregnancy on their problem list.  Patient reports no complaints.  Contractions: Not present. Vag. Bleeding: None.  Movement: Absent. Denies leaking of fluid.   The following portions of the patient's history were reviewed and updated as appropriate: allergies, current medications, past family history, past medical history, past social history, past surgical history and problem list. Problem list updated.  Objective:   Vitals:   04/18/18 1433  BP: 118/60  Weight: 236 lb (107 kg)    Fetal Status: Fetal Heart Rate (bpm): 149   Movement: Absent     General:  Alert, oriented and cooperative. Patient is in no acute distress.  Skin: Skin is warm and dry. No rash noted.   Cardiovascular: Normal heart rate noted  Respiratory: Normal respiratory effort, no problems with respiration noted  Abdomen: Soft, gravid, appropriate for gestational age.  Pain/Pressure: Absent     Pelvic: Cervical exam deferred        Extremities: Normal range of motion.  Edema: None  Mental Status: Normal mood and affect. Normal behavior. Normal judgment and thought content.   Assessment and Plan:  Pregnancy: [id number] at 8054w1d  1. Supervision of high risk  pregnancy, antepartum, second trimester - Genetic Screening - US MFM OB COMP + 14 WK; Future  2. S/P cesarean section   3. Obesity affecting pregnancy in second trimester   4. High-risk pregnancy in second trimester   5. Obesity in pregnancy   6. Cervical incompetence, antepartum condition or complication   Preterm labor symptoms and general obstetric precautions including but not limited to vaginal bleeding, contractions, leaking of fluid and fetal movement were reviewed in detail with the patient. Please refer to After Visit Summary for other counseling recommendations.  Return in about 1 month (around 05/16/2018) for MSAFP at next visit.  Future Appointments  Date Time Provider Department Center  05/16/2018  2:00 PM Samantha Bossierove, Mychele Seyller C, Samantha Guzman [facility]    Samantha BossierMyra C Jari Carollo, Samantha Guzman

## 2018-04-23 ENCOUNTER — Encounter (HOSPITAL_COMMUNITY): Payer: Self-pay

## 2018-04-23 ENCOUNTER — Encounter (HOSPITAL_COMMUNITY): Payer: Self-pay | Admitting: *Deleted

## 2018-04-26 ENCOUNTER — Encounter (HOSPITAL_COMMUNITY)
Admission: RE | Disposition: A | Discharge status: Home / Self Care | Payer: Self-pay | Source: Ambulatory Visit | Attending: Family Medicine

## 2018-04-26 ENCOUNTER — Encounter (HOSPITAL_COMMUNITY): Payer: Self-pay

## 2018-04-26 ENCOUNTER — Ambulatory Visit (HOSPITAL_COMMUNITY): Payer: Medicaid Other | Admitting: Anesthesiology

## 2018-04-26 ENCOUNTER — Other Ambulatory Visit: Payer: Self-pay

## 2018-04-26 ENCOUNTER — Encounter: Payer: Self-pay | Admitting: *Deleted

## 2018-04-26 ENCOUNTER — Ambulatory Visit (HOSPITAL_COMMUNITY)
Admission: RE | Admit: 2018-04-26 | Discharge: 2018-04-26 | Disposition: A | Discharge status: Home / Self Care | Payer: Medicaid Other | Source: Ambulatory Visit | Attending: Family Medicine | Admitting: Family Medicine

## 2018-04-26 DIAGNOSIS — O3431 Maternal care for cervical incompetence, first trimester: Secondary | ICD-10-CM | POA: Diagnosis not present

## 2018-04-26 DIAGNOSIS — Z3A13 13 weeks gestation of pregnancy: Secondary | ICD-10-CM | POA: Diagnosis not present

## 2018-04-26 DIAGNOSIS — Z87891 Personal history of nicotine dependence: Secondary | ICD-10-CM | POA: Insufficient documentation

## 2018-04-26 DIAGNOSIS — R1110 Vomiting, unspecified: Secondary | ICD-10-CM | POA: Diagnosis present

## 2018-04-26 DIAGNOSIS — Z3480 Encounter for supervision of other normal pregnancy, unspecified trimester: Secondary | ICD-10-CM

## 2018-04-26 DIAGNOSIS — O3430 Maternal care for cervical incompetence, unspecified trimester: Secondary | ICD-10-CM

## 2018-04-26 HISTORY — PX: CERVICAL CERCLAGE: SHX1329

## 2018-04-26 SURGERY — CERCLAGE, CERVIX, VAGINAL APPROACH
Anesthesia: Spinal

## 2018-04-26 MED ORDER — ONDANSETRON HCL 4 MG/2ML IJ SOLN
4.0000 mg | Freq: Once | INTRAMUSCULAR | Status: AC
  Administered 2018-04-26: 4 mg via INTRAVENOUS

## 2018-04-26 MED ORDER — ONDANSETRON HCL 4 MG/2ML IJ SOLN
INTRAMUSCULAR | Status: AC
  Administered 2018-04-26: 4 mg via INTRAVENOUS
  Filled 2018-04-26: qty 2

## 2018-04-26 MED ORDER — LACTATED RINGERS IV SOLN
INTRAVENOUS | Status: DC
  Administered 2018-04-26: 125 mL/h via INTRAVENOUS
  Administered 2018-04-26: 15:00:00 via INTRAVENOUS
  Administered 2018-04-26: 125 mL/h via INTRAVENOUS

## 2018-04-26 MED ORDER — ONDANSETRON HCL 8 MG PO TABS: 8.0000 mg | ORAL_TABLET | Freq: Three times a day (TID) | ORAL | 2 refills | Status: DC | PRN

## 2018-04-26 MED ORDER — INDOMETHACIN 50 MG PO CAPS: 50.0000 mg | ORAL_CAPSULE | Freq: Four times a day (QID) | ORAL | 0 refills | Status: DC

## 2018-04-26 MED ORDER — ACETAMINOPHEN 10 MG/ML IV SOLN: 1000.0000 mg | Freq: Once | INTRAVENOUS | Status: DC | PRN

## 2018-04-26 MED ORDER — ONDANSETRON HCL 4 MG/2ML IJ SOLN
INTRAMUSCULAR | Status: DC | PRN
  Administered 2018-04-26: 4 mg via INTRAVENOUS

## 2018-04-26 MED ORDER — ONDANSETRON HCL 4 MG/2ML IJ SOLN
INTRAMUSCULAR | Status: AC
  Filled 2018-04-26: qty 2

## 2018-04-26 MED ORDER — LACTATED RINGERS IV SOLN: INTRAVENOUS | Status: DC

## 2018-04-26 MED ORDER — SCOPOLAMINE 1 MG/3DAYS TD PT72: 1.0000 | MEDICATED_PATCH | Freq: Once | TRANSDERMAL | Status: DC

## 2018-04-26 MED ORDER — PROPOFOL 10 MG/ML IV BOLUS
INTRAVENOUS | Status: DC | PRN
  Administered 2018-04-26 (×2): 20 mg via INTRAVENOUS

## 2018-04-26 MED ORDER — METOCLOPRAMIDE HCL 5 MG/ML IJ SOLN
INTRAMUSCULAR | Status: DC | PRN
  Administered 2018-04-26: 10 mg via INTRAVENOUS

## 2018-04-26 MED ORDER — FENTANYL CITRATE (PF) 100 MCG/2ML IJ SOLN: 25.0000 ug | INTRAMUSCULAR | Status: DC | PRN

## 2018-04-26 MED ORDER — MEPERIDINE HCL 25 MG/ML IJ SOLN: 6.2500 mg | INTRAMUSCULAR | Status: DC | PRN

## 2018-04-26 MED ORDER — BUPIVACAINE IN DEXTROSE 0.75-8.25 % IT SOLN
INTRATHECAL | Status: DC | PRN
  Administered 2018-04-26: 1.2 mL via INTRATHECAL

## 2018-04-26 MED ORDER — DEXAMETHASONE SODIUM PHOSPHATE 10 MG/ML IJ SOLN
INTRAMUSCULAR | Status: AC
  Filled 2018-04-26: qty 1

## 2018-04-26 MED ORDER — PROMETHAZINE HCL 25 MG/ML IJ SOLN: 6.2500 mg | INTRAMUSCULAR | Status: DC | PRN

## 2018-04-26 MED ORDER — DEXAMETHASONE SODIUM PHOSPHATE 10 MG/ML IJ SOLN
INTRAMUSCULAR | Status: DC | PRN
  Administered 2018-04-26: 10 mg via INTRAVENOUS

## 2018-04-26 MED ORDER — PROPOFOL 10 MG/ML IV BOLUS
INTRAVENOUS | Status: AC
  Filled 2018-04-26: qty 20

## 2018-04-26 SURGICAL SUPPLY — 15 items
CANISTER SUCT 3000ML PPV (MISCELLANEOUS) ×3 IMPLANT
GLOVE BIOGEL PI IND STRL 7.0 (GLOVE) ×2 IMPLANT
GLOVE BIOGEL PI INDICATOR 7.0 (GLOVE) ×4
GLOVE ECLIPSE 7.0 STRL STRAW (GLOVE) ×3 IMPLANT
GOWN STRL REUS W/TWL LRG LVL3 (GOWN DISPOSABLE) ×9 IMPLANT
NEEDLE MAYO CATGUT SZ4 (NEEDLE) ×3 IMPLANT
PACK VAGINAL MINOR WOMEN LF (CUSTOM PROCEDURE TRAY) ×3 IMPLANT
PAD OB MATERNITY 4.3X12.25 (PERSONAL CARE ITEMS) ×3 IMPLANT
PAD PREP 24X48 CUFFED NSTRL (MISCELLANEOUS) ×3 IMPLANT
SUT MERSILENE 5MM BP 1 12 (SUTURE) ×3 IMPLANT
TOWEL OR 17X24 6PK STRL BLUE (TOWEL DISPOSABLE) ×6 IMPLANT
TRAY FOLEY W/BAG SLVR 14FR (SET/KITS/TRAYS/PACK) ×3 IMPLANT
TUBING NON-CON 1/4 X 20 CONN (TUBING) IMPLANT
TUBING NON-CON 1/4 X 20' CONN (TUBING)
YANKAUER SUCT BULB TIP NO VENT (SUCTIONS) IMPLANT

## 2018-04-26 NOTE — Anesthesia Preprocedure Evaluation (Signed)
Anesthesia Evaluation  Patient identified by MRN, date of birth, ID band Patient awake    Reviewed: Allergy & Precautions, H&P , NPO status , Patient's Chart, lab work & pertinent test results  Airway Mallampati: II       Dental   Pulmonary former smoker,    breath sounds clear to auscultation       Cardiovascular Exercise Tolerance: Good  Rhythm:regular Rate:Normal     Neuro/Psych PSYCHIATRIC DISORDERS Anxiety Depression    GI/Hepatic GERD  ,  Endo/Other  Morbid obesity  Renal/GU      Musculoskeletal   Abdominal   Peds  Hematology  (+) anemia ,   Anesthesia Other Findings vertigo  Reproductive/Obstetrics (+) Pregnancy                             Anesthesia Physical  Anesthesia Plan  ASA: III  Anesthesia Plan: Spinal   Post-op Pain Management:    Induction:   PONV Risk Score and Plan: 3 and Ondansetron, Dexamethasone and Scopolamine patch - Pre-op  Airway Management Planned:   Additional Equipment:   Intra-op Plan:   Post-operative Plan:   Informed Consent: I have reviewed the patients History and Physical, chart, labs and discussed the procedure including the risks, benefits and alternatives for the proposed anesthesia with the patient or authorized representative who has indicated his/her understanding and acceptance.   Dental advisory given  Plan Discussed with: CRNA  Anesthesia Plan Comments: (Pt with possible GI virus vs. Hyperemesis gravidarum. Discussed with patient and Dr. Shawnie PonsPratt. Likely no increased risk of surgery if GI virus. Aspiration precautions. Will proceed.)        Anesthesia Quick Evaluation

## 2018-04-26 NOTE — H&P (Signed)
Samantha Guzman is an 32 y.o. [phone] [redacted]w[redacted]d female.   Chief Complaint: incompetent cervix HPI: Here for cervical cerclage. H/o incompetent cervix in the past with previous cerclage.  Past Medical History:  Diagnosis Date  . Anxiety   . Depression    brief hx, ok now  . GERD (gastroesophageal reflux disease)   . Hyperemesis   . Incompetent cervix in pregnancy   . Infection    UTI  . No significant past medical history   . Vertigo     Past Surgical History:  Procedure Laterality Date  . CERVICAL CERCLAGE N/A 08/09/2013   Procedure: CERCLAGE CERVICAL;  Surgeon: Brock Bad, MD;  Location: [facility];  Service: Gynecology;  Laterality: N/A;  . CESAREAN SECTION    . CESAREAN SECTION N/A 02/02/2014   Procedure: REPEAT CESAREAN SECTION;  Surgeon: Brock Bad, MD;  Location: [facility];  Service: Obstetrics;  Laterality: N/A;    Family History  Problem Relation Age of Onset  . Stroke Father   . Heart disease Father    Social History:  reports that she quit smoking about 8 years ago. Her smoking use included cigarettes. She has never used smokeless tobacco. She reports that she does not drink alcohol or use drugs.    Allergies  Allergen Reactions  . Tramadol     Tramadol caused itching.    Medications Prior to Admission  Medication Sig Dispense Refill  . metoCLOPramide (REGLAN) 10 MG tablet Take 1 tablet (10 mg total) by mouth 3 (three) times daily before meals. 90 tablet 1  . ondansetron (ZOFRAN) 8 MG tablet Take 1 tablet (8 mg total) by mouth every 8 (eight) hours as needed for nausea or vomiting. 20 tablet 0  . docusate sodium (COLACE) 100 MG capsule Take 1 capsule (100 mg total) by mouth 2 (two) times daily. (Patient not taking: Reported on 04/18/2018) 10 capsule 0  . Doxylamine-Pyridoxine 10-10 MG TBEC Take 1 tablet by mouth 2 (two) times daily. (Patient not taking: Reported on 04/18/2018) 60 tablet 5  . glycopyrrolate (ROBINUL) 1 MG tablet Take 1 tablet (1 mg total) by mouth  3 (three) times daily. (Patient not taking: Reported on 04/18/2018) 90 tablet 10  . polyethylene glycol (MIRALAX / GLYCOLAX) packet Take 17 g by mouth daily. (Patient not taking: Reported on 04/18/2018) 14 each 0  . potassium chloride SA (K-DUR,KLOR-CON) 20 MEQ tablet Take 2 tablets (40 mEq total) by mouth 2 (two) times daily. (Patient not taking: Reported on 04/18/2018) 12 tablet 0  . promethazine (PHENERGAN) 25 MG suppository Place 1 suppository (25 mg total) rectally every 6 (six) hours as needed for nausea. (Patient not taking: Reported on 04/18/2018) 12 suppository 2     A comprehensive review of systems was negative.  Blood pressure 130/76, pulse 68, temperature 98.7 F (37.1 C), temperature source Oral, resp. rate 18, height 5\' 7"  (1.702 m), weight 236 lb (107 kg), last menstrual period 01/14/2018, SpO2 100 %. BP 130/76   Pulse 68   Temp 98.7 F (37.1 C) (Oral)   Resp 18   Ht 5\' 7"  (1.702 m)   Wt 236 lb (107 kg)   LMP 01/14/2018   SpO2 100%   BMI 36.96 kg/m  General appearance: alert, cooperative and appears stated age Head: Normocephalic, without obvious abnormality, atraumatic Neck: supple, symmetrical, trachea midline Lungs: normal effort Heart: regular rate and rhythm Abdomen: soft, non-tender; bowel sounds normal; no masses,  no organomegaly Extremities: extremities normal, atraumatic, no cyanosis or edema  Skin: Skin color, texture, turgor normal. No rashes or lesions Neurologic: Grossly normal   Lab Results  Component Value Date   WBC 10.5 04/07/2018   HGB 12.6 04/07/2018   HCT 36.6 04/07/2018   MCV 80.8 04/07/2018   PLT 283 04/07/2018         ABO, Rh: O/RH(D) POSITIVE/-- (05/09 1526)  Antibody: NO ANTIBODIES DETECTED (05/09 1526)  Rubella: 3.04 (05/09 1526)  RPR: NON-REACTIVE (05/09 1526)  HBsAg: NON-REACTIVE (05/09 1526)  HIV: NON-REACTIVE (05/09 1526)  GBS:       Assessment/Plan Principal Problem:   Cervical incompetence, antepartum condition or  complication Active Problems:   Hyperemesis gravidarum  For cervical cerclage Risks include but are not limited to bleeding, infection, injury to surrounding structures, including bowel, bladder and ureters, blood clots, and death.  Likelihood of success is high.   Samantha Guzman 04/26/2018, 2:05 PM

## 2018-04-26 NOTE — Discharge Instructions (Signed)
°Post Anesthesia Home Care Instructions ° °Activity: °Get plenty of rest for the remainder of the day. A responsible individual must stay with you for 24 hours following the procedure.  °For the next 24 hours, DO NOT: °-Drive a car °-Operate machinery °-Drink alcoholic beverages °-Take any medication unless instructed by your physician °-Make any legal decisions or sign important papers. ° °Meals: °Start with liquid foods such as gelatin or soup. Progress to regular foods as tolerated. Avoid greasy, spicy, heavy foods. If nausea and/or vomiting occur, drink only clear liquids until the nausea and/or vomiting subsides. Call your physician if vomiting continues. ° °Special Instructions/Symptoms: °Your throat may feel dry or sore from the anesthesia or the breathing tube placed in your throat during surgery. If this causes discomfort, gargle with warm salt water. The discomfort should disappear within 24 hours. ° °If you had a scopolamine patch placed behind your ear for the management of post- operative nausea and/or vomiting: ° °1. The medication in the patch is effective for 72 hours, after which it should be removed.  Wrap patch in a tissue and discard in the trash. Wash hands thoroughly with soap and water. °2. You may remove the patch earlier than 72 hours if you experience unpleasant side effects which may include dry mouth, dizziness or visual disturbances. °3. Avoid touching the patch. Wash your hands with soap and water after contact with the patch. °  ° ° °Cervical Cerclage, Care After °This sheet gives you information about how to care for yourself after your procedure. Your health care provider may also give you more specific instructions. If you have problems or questions, contact your health care provider. °What can I expect after the procedure? °After your procedure, it is common to have: °· Cramping in your abdomen. °· Mucus discharge for several days. °· Painful urination (dysuria). °· Small drops of  blood coming from your vagina (spotting). ° °Follow these instructions at home: °· Follow instructions from your health care provider about bed rest, if this applies. You may need to be on bed rest for up to 3 days. °· Take over-the-counter and prescription medicines only as told by your health care provider. °· Do not drive or use heavy machinery while taking prescription pain medicine. °· Keep track of your vaginal discharge and watch for any changes. If you notice changes, tell your health care provider. °· Avoid physical activities and exercise until your health care provider approves. Ask your health care provider what activities are safe for you. °· Until your health care provider approves: °? Do not douche. °? Do not have sexual intercourse. °· Keep all pre-birth (prenatal) visits and all follow-up visits as told by your health care provider. This is important. You will probably have weekly visits to have your cervix checked, and you may need an ultrasound. °Contact a health care provider if: °· You have abnormal or bad-smelling vaginal discharge, such as clots. °· You develop a rash on your skin. This may look like redness and swelling. °· You become light-headed or feel like you are going to faint. °· You have abdominal pain that does not get better with medicine. °· You have persistent nausea or vomiting. °Get help right away if: °· You have vaginal bleeding that is heavier or more frequent than spotting. °· You are leaking fluid or have a gush of fluid from your vagina (your water breaks). °· You have a fever or chills. °· You faint. °· You have uterine contractions. These may   feel like: °? A back ache. °? Lower abdominal pain. °? Mild cramps, similar to menstrual cramps. °? Tightening or pressure in your abdomen. °· You think that your baby is not moving as much as usual, or you cannot feel your baby move. °· You have chest pain. °· You have shortness of breath. °This information is not intended to  replace advice given to you by your health care provider. Make sure you discuss any questions you have with your health care provider. °Document Released: 08/13/2013 Document Revised: 06/21/2016 Document Reviewed: 05/26/2016 °Elsevier Interactive Patient Education © 2018 [employer] ° °

## 2018-04-26 NOTE — Anesthesia Procedure Notes (Signed)
Spinal  Patient location during procedure: OR Staffing Anesthesiologist: Germeroth, John, MD Performed: anesthesiologist  Preanesthetic Checklist Completed: patient identified, site marked, surgical consent, pre-op evaluation, timeout performed, IV checked, risks and benefits discussed and monitors and equipment checked Spinal Block Patient position: sitting Prep: site prepped and draped and DuraPrep Patient monitoring: heart rate, continuous pulse ox and blood pressure Approach: midline Location: L4-5 Injection technique: single-shot Needle Needle type: Sprotte  Needle gauge: 24 G Needle length: 9 cm Assessment Sensory level: T10 Additional Notes Expiration date of kit checked and confirmed. Patient tolerated procedure well, without complications.       

## 2018-04-26 NOTE — Transfer of Care (Signed)
Immediate Anesthesia Transfer of Care Note  Patient: Allena NapoleonRenee Titus  Procedure(s) Performed: CERCLAGE CERVICAL (N/A )  Patient Location: PACU  Anesthesia Type:Spinal  Level of Consciousness: awake, alert  and oriented  Airway & Oxygen Therapy: Patient Spontanous Breathing  Post-op Assessment: Report given to RN and Post -op Vital signs reviewed and stable  Post vital signs: Reviewed and stable  Last Vitals:  Vitals Value Taken Time  BP    Temp    Pulse 62 04/26/2018  2:56 PM  Resp    SpO2 100 % 04/26/2018  2:56 PM  Vitals shown include unvalidated device data.  Last Pain:  Vitals:   04/26/18 1137  TempSrc: Oral  PainSc: 7       Patients Stated Pain Goal: 3 (04/26/18 1137)  Complications: No apparent anesthesia complications

## 2018-04-26 NOTE — Op Note (Signed)
Preoperative diagnosis: Incompetent cervix   Postoperative diagnosis: Same  Procedure: Cervical cerclage  Surgeon: Tinnie Gensanya Margret Moat, M.D.  Anesthesia: Spinal  Findings: normal appearing cervix  Estimated blood loss: minimal  Specimens: None  Reason for procedure: Samantha NapoleonRenee Guzman [id number], with h/o incompetent cervix and previous cerclage, here with 2nd trimester pregnancy, needs cerclage.  Procedure: Patient was taken to the operating room where spinal analgesia was administered. She was prepped and draped in the usual sterile fashion.  A Foley catheter is used to drain her bladder. A timeout was performed. The patient had SCDs in place.The patient was in dorsal lithotomy.  A weighted speculum was placed inside the vagina.  A Deaver was used anteriorly. The cervix was grasped with an open ring  forcep. A 5 mm Mersilene band on a cutting needle was used and to put in a pursestring suture. This was started at 12:00 and exiting at 9:00, starting at 9:00 and exiting at 6:00, starting at 6:00 and exiting at 3:00, starting at 3:00 and exiting at 12:00. A suture was then tied down. All instrument, needle and lap counts were correct x 2. The patient was taken to recovery in stable condition.  Shelbie Proctoranya S PrattMD 04/26/2018 2:58 PM

## 2018-04-27 NOTE — Anesthesia Postprocedure Evaluation (Signed)
Anesthesia Post Note  Patient: Samantha NapoleonRenee Guzman  Procedure(s) Performed: CERCLAGE CERVICAL (N/A )     Patient location during evaluation: PACU Anesthesia Type: Spinal Level of consciousness: awake and alert Pain management: pain level controlled Vital Signs Assessment: post-procedure vital signs reviewed and stable Respiratory status: spontaneous breathing and respiratory function stable Cardiovascular status: blood pressure returned to baseline and stable Postop Assessment: spinal receding Anesthetic complications: no    Last Vitals:  Vitals:   04/26/18 1930 04/26/18 2015  BP: 133/83 (!) 157/97  Pulse: 89 87  Resp: (!) 23 20  Temp:  37.1 C  SpO2: 100% 99%    Last Pain:  Vitals:   04/26/18 2015  TempSrc:   PainSc: 0-No pain   Pain Goal: Patients Stated Pain Goal: 3 (04/26/18 1915)               Lewie LoronJohn Aiana Nordquist

## 2018-04-28 ENCOUNTER — Encounter (HOSPITAL_COMMUNITY): Payer: Self-pay | Admitting: Family Medicine

## 2018-05-06 ENCOUNTER — Telehealth: Payer: Self-pay

## 2018-05-06 ENCOUNTER — Other Ambulatory Visit: Payer: Self-pay

## 2018-05-06 ENCOUNTER — Inpatient Hospital Stay (HOSPITAL_COMMUNITY)
Admission: AD | Admit: 2018-05-06 | Discharge: 2018-05-08 | DRG: 831 | Disposition: A | Discharge status: Home / Self Care | Payer: Medicaid Other | Attending: Obstetrics and Gynecology | Admitting: Obstetrics and Gynecology

## 2018-05-06 ENCOUNTER — Institutional Professional Consult (permissible substitution): Payer: Medicaid Other | Admitting: Obstetrics & Gynecology

## 2018-05-06 ENCOUNTER — Encounter (HOSPITAL_COMMUNITY): Payer: Self-pay | Admitting: *Deleted

## 2018-05-06 DIAGNOSIS — R55 Syncope and collapse: Secondary | ICD-10-CM | POA: Diagnosis present

## 2018-05-06 DIAGNOSIS — E876 Hypokalemia: Secondary | ICD-10-CM | POA: Diagnosis present

## 2018-05-06 DIAGNOSIS — Z87891 Personal history of nicotine dependence: Secondary | ICD-10-CM

## 2018-05-06 DIAGNOSIS — Z3A15 15 weeks gestation of pregnancy: Secondary | ICD-10-CM

## 2018-05-06 DIAGNOSIS — E875 Hyperkalemia: Secondary | ICD-10-CM

## 2018-05-06 DIAGNOSIS — O0990 Supervision of high risk pregnancy, unspecified, unspecified trimester: Secondary | ICD-10-CM

## 2018-05-06 DIAGNOSIS — O210 Mild hyperemesis gravidarum: Secondary | ICD-10-CM

## 2018-05-06 DIAGNOSIS — O99212 Obesity complicating pregnancy, second trimester: Secondary | ICD-10-CM | POA: Diagnosis present

## 2018-05-06 DIAGNOSIS — O99210 Obesity complicating pregnancy, unspecified trimester: Secondary | ICD-10-CM

## 2018-05-06 DIAGNOSIS — Z98891 History of uterine scar from previous surgery: Secondary | ICD-10-CM

## 2018-05-06 DIAGNOSIS — O3430 Maternal care for cervical incompetence, unspecified trimester: Secondary | ICD-10-CM

## 2018-05-06 DIAGNOSIS — O26892 Other specified pregnancy related conditions, second trimester: Secondary | ICD-10-CM | POA: Diagnosis present

## 2018-05-06 DIAGNOSIS — O211 Hyperemesis gravidarum with metabolic disturbance: Secondary | ICD-10-CM | POA: Diagnosis present

## 2018-05-06 DIAGNOSIS — O3432 Maternal care for cervical incompetence, second trimester: Secondary | ICD-10-CM | POA: Diagnosis present

## 2018-05-06 DIAGNOSIS — B999 Unspecified infectious disease: Secondary | ICD-10-CM | POA: Diagnosis present

## 2018-05-06 DIAGNOSIS — E669 Obesity, unspecified: Secondary | ICD-10-CM | POA: Diagnosis present

## 2018-05-06 LAB — COMPREHENSIVE METABOLIC PANEL
ALT: 60 U/L — AB (ref 0–44)
AST: 37 U/L (ref 15–41)
Albumin: 3.4 g/dL — ABNORMAL LOW (ref 3.5–5.0)
Alkaline Phosphatase: 85 U/L (ref 38–126)
Anion gap: 12 (ref 5–15)
BUN: <5 mg/dL — ABNORMAL LOW (ref 6–20)
CHLORIDE: 99 mmol/L (ref 98–111)
CO2: 22 mmol/L (ref 22–32)
CREATININE: 0.53 mg/dL (ref 0.44–1.00)
Calcium: 8.9 mg/dL (ref 8.9–10.3)
GFR calc non Af Amer: >60 mL/min (ref >60)
Glucose, Bld: 84 mg/dL (ref 70–99)
POTASSIUM: 3.2 mmol/L — AB (ref 3.5–5.1)
Sodium: 133 mmol/L — ABNORMAL LOW (ref 135–145)
Total Bilirubin: 0.8 mg/dL (ref 0.3–1.2)
Total Protein: 7.1 g/dL (ref 6.5–8.1)

## 2018-05-06 LAB — CBC
HCT: 34.4 % — ABNORMAL LOW (ref 36.0–46.0)
Hemoglobin: 11.9 g/dL — ABNORMAL LOW (ref 12.0–15.0)
MCH: 27.2 pg (ref 26.0–34.0)
MCHC: 34.6 g/dL (ref 30.0–36.0)
MCV: 78.7 fL (ref 78.0–100.0)
PLATELETS: 255 10*3/uL (ref 150–400)
RBC: 4.37 MIL/uL (ref 3.87–5.11)
RDW: 13.1 % (ref 11.5–15.5)
WBC: 9.8 10*3/uL (ref 4.0–10.5)

## 2018-05-06 LAB — TYPE AND SCREEN
ABO/RH(D): O POS
Antibody Screen: NEGATIVE

## 2018-05-06 LAB — URINALYSIS, ROUTINE W REFLEX MICROSCOPIC
Bacteria, UA: NONE SEEN
Glucose, UA: NEGATIVE mg/dL
Hgb urine dipstick: NEGATIVE
Ketones, ur: 80 mg/dL — AB
Leukocytes, UA: NEGATIVE
Nitrite: NEGATIVE
PH: 6 (ref 5.0–8.0)
PROTEIN: 100 mg/dL — AB
SPECIFIC GRAVITY, URINE: 1.03 (ref 1.005–1.030)

## 2018-05-06 LAB — WET PREP, GENITAL
Sperm: NONE SEEN
TRICH WET PREP: NONE SEEN
Yeast Wet Prep HPF POC: NONE SEEN

## 2018-05-06 LAB — AMNISURE RUPTURE OF MEMBRANE (ROM) NOT AT ARMC: Amnisure ROM: NEGATIVE

## 2018-05-06 MED ORDER — METHYLPREDNISOLONE 4 MG PO TABS: 4.0000 mg | ORAL_TABLET | Freq: Every day | ORAL | Status: DC

## 2018-05-06 MED ORDER — ONDANSETRON HCL 4 MG/2ML IJ SOLN
4.0000 mg | Freq: Four times a day (QID) | INTRAMUSCULAR | Status: DC
  Administered 2018-05-06 – 2018-05-08 (×8): 4 mg via INTRAVENOUS
  Filled 2018-05-06 (×9): qty 2

## 2018-05-06 MED ORDER — METHYLPREDNISOLONE 4 MG PO TABS: 8.0000 mg | ORAL_TABLET | Freq: Every day | ORAL | Status: DC

## 2018-05-06 MED ORDER — DEXTROSE 5 % IN LACTATED RINGERS IV BOLUS
1000.0000 mL | Freq: Once | INTRAVENOUS | Status: AC
  Administered 2018-05-06: 1000 mL via INTRAVENOUS

## 2018-05-06 MED ORDER — METHYLPREDNISOLONE 16 MG PO TABS
16.0000 mg | ORAL_TABLET | Freq: Every day | ORAL | Status: AC
  Administered 2018-05-07 – 2018-05-08 (×2): 16 mg via ORAL
  Filled 2018-05-06 (×2): qty 1

## 2018-05-06 MED ORDER — GLYCOPYRROLATE 0.2 MG/ML IJ SOLN
0.2000 mg | Freq: Three times a day (TID) | INTRAMUSCULAR | Status: DC
  Administered 2018-05-06 – 2018-05-08 (×6): 0.2 mg via INTRAVENOUS
  Filled 2018-05-06 (×9): qty 1

## 2018-05-06 MED ORDER — METHYLPREDNISOLONE 4 MG PO TABS
8.0000 mg | ORAL_TABLET | Freq: Every day | ORAL | Status: DC
  Filled 2018-05-06: qty 2

## 2018-05-06 MED ORDER — PROMETHAZINE HCL 25 MG/ML IJ SOLN
25.0000 mg | Freq: Four times a day (QID) | INTRAMUSCULAR | Status: DC | PRN
  Administered 2018-05-06: 25 mg via INTRAVENOUS
  Filled 2018-05-06: qty 1

## 2018-05-06 MED ORDER — METHYLPREDNISOLONE 16 MG PO TABS
16.0000 mg | ORAL_TABLET | Freq: Every day | ORAL | Status: DC
  Administered 2018-05-07 – 2018-05-08 (×2): 16 mg via ORAL
  Filled 2018-05-06 (×3): qty 1

## 2018-05-06 MED ORDER — SCOPOLAMINE 1 MG/3DAYS TD PT72
1.0000 | MEDICATED_PATCH | TRANSDERMAL | Status: DC
  Administered 2018-05-06: 1.5 mg via TRANSDERMAL
  Filled 2018-05-06 (×2): qty 1

## 2018-05-06 MED ORDER — METHYLPREDNISOLONE 16 MG PO TABS
16.0000 mg | ORAL_TABLET | Freq: Every day | ORAL | Status: DC
  Administered 2018-05-07: 16 mg via ORAL
  Filled 2018-05-06 (×2): qty 1

## 2018-05-06 MED ORDER — FAMOTIDINE IN NACL 20-0.9 MG/50ML-% IV SOLN
20.0000 mg | Freq: Once | INTRAVENOUS | Status: AC
  Administered 2018-05-06: 20 mg via INTRAVENOUS
  Filled 2018-05-06: qty 50

## 2018-05-06 MED ORDER — M.V.I. ADULT IV INJ
INJECTION | Freq: Once | INTRAVENOUS | Status: AC
  Administered 2018-05-06: 12:00:00 via INTRAVENOUS
  Filled 2018-05-06: qty 1000

## 2018-05-06 MED ORDER — GLYCOPYRROLATE 0.2 MG/ML IJ SOLN
0.2000 mg | Freq: Once | INTRAMUSCULAR | Status: AC
  Administered 2018-05-06: 0.2 mg via INTRAVENOUS
  Filled 2018-05-06: qty 1

## 2018-05-06 MED ORDER — FAMOTIDINE IN NACL 20-0.9 MG/50ML-% IV SOLN
20.0000 mg | Freq: Two times a day (BID) | INTRAVENOUS | Status: DC
  Administered 2018-05-06 – 2018-05-08 (×4): 20 mg via INTRAVENOUS
  Filled 2018-05-06 (×5): qty 50

## 2018-05-06 MED ORDER — POTASSIUM CHLORIDE 2 MEQ/ML IV SOLN
Freq: Once | INTRAVENOUS | Status: AC
  Administered 2018-05-06: 14:00:00 via INTRAVENOUS
  Filled 2018-05-06: qty 1000

## 2018-05-06 MED ORDER — ONDANSETRON HCL 4 MG/2ML IJ SOLN
4.0000 mg | Freq: Once | INTRAMUSCULAR | Status: AC
  Administered 2018-05-06: 4 mg via INTRAVENOUS
  Filled 2018-05-06: qty 2

## 2018-05-06 MED ORDER — PROMETHAZINE HCL 25 MG RE SUPP
25.0000 mg | Freq: Four times a day (QID) | RECTAL | Status: DC | PRN
  Filled 2018-05-06: qty 1

## 2018-05-06 MED ORDER — POTASSIUM CHLORIDE 2 MEQ/ML IV SOLN
Freq: Once | INTRAVENOUS | Status: AC
  Administered 2018-05-06: 17:00:00 via INTRAVENOUS
  Filled 2018-05-06: qty 1000

## 2018-05-06 MED ORDER — LACTATED RINGERS IV SOLN
INTRAVENOUS | Status: DC
  Administered 2018-05-07 – 2018-05-08 (×5): via INTRAVENOUS

## 2018-05-06 MED ORDER — METHYLPREDNISOLONE SODIUM SUCC 125 MG IJ SOLR
48.0000 mg | Freq: Once | INTRAMUSCULAR | Status: AC
  Administered 2018-05-06: 48 mg via INTRAVENOUS
  Filled 2018-05-06: qty 0.77

## 2018-05-06 NOTE — MAU Note (Addendum)
Pt presents with c/o N&V throughout pregnancy, states not being controlled despite taking Zofran, Reglan, & Phenergan suppositories.  Reports vomiting @ 10x in 24 hours.  Reports unable to keep food or fluids down. S/p cerclage April 26, 2018, reports watery discharge that began 3 days ago.  Denies LOF, states "just a discharge". Reports she passed out this morning, awoke in bathroom floor.

## 2018-05-06 NOTE — H&P (Signed)
Chief Complaint: Nausea; Emesis; and Fatigue   First Provider Initiated Contact with Patient 05/06/18 1021      SUBJECTIVE HPI: Samantha Guzman is a 32 y.o. [id number] at [redacted]w[redacted]d by LMP, with hyperemesis and hx cervical incompetence, s/p cerclage on 04/26/18, who presents to maternity admissions reporting a syncopal episode at home today with worsening n/v, and leaking some clear discharge x 3 days.  She reports a hx of hyperemesis, and is taking phenergan suppositories and Zofran but is not able to keep down food or fluids. She has lost ~25 lbs this pregnancy.  She reports feeling dizzy and fatigued for weeks, and then today she was on the toilet and woke up on the floor. She does not believe she hit her head or abdomen and denies any known injury. She reports that the clear discharge is not a large amount, has not required a pad, but has been persisted x 3 days. She has not tried any treatments. There are no other associated symptoms.   HPI  Past Medical History:  Diagnosis Date  . Anxiety   . Depression    brief hx, ok now  . GERD (gastroesophageal reflux disease)   . Hyperemesis   . Incompetent cervix in pregnancy   . Infection    UTI  . No significant past medical history   . Vertigo    Past Surgical History:  Procedure Laterality Date  . CERVICAL CERCLAGE N/A 08/09/2013   Procedure: CERCLAGE CERVICAL;  Surgeon: Brock Bad, MD;  Location: [facility];  Service: Gynecology;  Laterality: N/A;  . CERVICAL CERCLAGE N/A 04/26/2018   Procedure: CERCLAGE CERVICAL;  Surgeon: Reva Bores, MD;  Location: [facility];  Service: Gynecology;  Laterality: N/A;  . CESAREAN SECTION    . CESAREAN SECTION N/A 02/02/2014   Procedure: REPEAT CESAREAN SECTION;  Surgeon: Brock Bad, MD;  Location: [facility];  Service: Obstetrics;  Laterality: N/A;   Social History   Socioeconomic History  . Marital status: Single    Spouse name: Not on file  . Number of children: Not on file  . Years of education: Not  on file  . Highest education level: Not on file  Occupational History  . Not on file  Social Needs  . Financial resource strain: Not on file  . Food insecurity:    Worry: Not on file    Inability: Not on file  . Transportation needs:    Medical: Not on file    Non-medical: Not on file  Tobacco Use  . Smoking status: Former Smoker    Types: Cigarettes    Last attempt to quit: 06/06/2009    Years since quitting: 8.9  . Smokeless tobacco: Never Used  Substance and Sexual Activity  . Alcohol use: No  . Drug use: No  . Sexual activity: Not Currently    Partners: Male  Lifestyle  . Physical activity:    Days per week: Not on file    Minutes per session: Not on file  . Stress: Not on file  Relationships  . Social connections:    Talks on phone: Not on file    Gets together: Not on file    Attends religious service: Not on file    Active member of club or organization: Not on file    Attends meetings of clubs or organizations: Not on file    Relationship status: Not on file  . Intimate partner violence:    Fear of current or ex partner: Not  on file    Emotionally abused: Not on file    Physically abused: Not on file    Forced sexual activity: Not on file  Other Topics Concern  . Not on file  Social History Narrative  . Not on file   No current facility-administered medications on file prior to encounter.    No current outpatient medications on file prior to encounter.   Allergies  Allergen Reactions  . Tramadol     Tramadol caused itching.    ROS:  Review of Systems  Constitutional: Negative for chills, fatigue and fever.  Eyes: Negative for visual disturbance.  Respiratory: Negative for shortness of breath.   Cardiovascular: Negative for chest pain.  Gastrointestinal: Negative for abdominal pain, nausea and vomiting.  Genitourinary: Negative for difficulty urinating, dysuria, flank pain, pelvic pain, vaginal bleeding, vaginal discharge and vaginal pain.   Neurological: Negative for dizziness and headaches.  Psychiatric/Behavioral: Negative.      I have reviewed patient's Past Medical Hx, Surgical Hx, Family Hx, Social Hx, medications and allergies.   Physical Exam   Patient Vitals for the past 24 hrs:  BP Temp Temp src Pulse Resp SpO2 Height Weight  05/06/18 1549 132/81 98.7 F (37.1 C) Oral 82 18 100 % - -  05/06/18 1002 (!) 143/77 98.6 F (37 C) Oral 98 18 99 % - -  05/06/18 0954 - - - - - - 5\' 6"  (1.676 m) 220 lb (99.8 kg)   Constitutional: Well-developed, well-nourished female in moderate distress.  Cardiovascular: normal rate Respiratory: normal effort GI: Abd soft, non-tender. Pos BS x 4 MS: Extremities nontender, no edema, normal ROM Neurologic: Alert and oriented x 4.  GU: Neg CVAT.  PELVIC EXAM: Cervix pink, visually closed, without lesion, cerclage in place and wnl, small amount white creamy discharge, vaginal walls and external genitalia normal   Cervix 0/long/high, firm, anterior, cerclage palpable wnl   FHT 142 by doppler  LAB RESULTS Results for orders placed or performed during the hospital encounter of 05/06/18 (from the past 24 hour(s))  Urinalysis, Routine w reflex microscopic     Status: Abnormal   Collection Time: 05/06/18 10:15 AM  Result Value Ref Range   Color, Urine AMBER (A) YELLOW   APPearance HAZY (A) CLEAR   Specific Gravity, Urine 1.030 1.005 - 1.030   pH 6.0 5.0 - 8.0   Glucose, UA NEGATIVE NEGATIVE mg/dL   Hgb urine dipstick NEGATIVE NEGATIVE   Bilirubin Urine SMALL (A) NEGATIVE   Ketones, ur 80 (A) NEGATIVE mg/dL   Protein, ur 161100 (A) NEGATIVE mg/dL   Nitrite NEGATIVE NEGATIVE   Leukocytes, UA NEGATIVE NEGATIVE   RBC / HPF 0-5 0 - 5 RBC/hpf   WBC, UA 6-10 0 - 5 WBC/hpf   Bacteria, UA NONE SEEN NONE SEEN   Squamous Epithelial / LPF 6-10 0 - 5   Mucus PRESENT   Amnisure rupture of membrane (rom)not at [facility]     Status: None   Collection Time: 05/06/18 10:42 AM  Result Value Ref  Range   Amnisure ROM NEGATIVE   Wet prep, genital     Status: Abnormal   Collection Time: 05/06/18 10:42 AM  Result Value Ref Range   Yeast Wet Prep HPF POC NONE SEEN NONE SEEN   Trich, Wet Prep NONE SEEN NONE SEEN   Clue Cells Wet Prep HPF POC PRESENT (A) NONE SEEN   WBC, Wet Prep HPF POC MANY (A) NONE SEEN   Sperm NONE SEEN   CBC  Status: Abnormal   Collection Time: 05/06/18 11:02 AM  Result Value Ref Range   WBC 9.8 4.0 - 10.5 K/uL   RBC 4.37 3.87 - 5.11 MIL/uL   Hemoglobin 11.9 (L) 12.0 - 15.0 g/dL   HCT 30.1 (L) 60.1 - 09.3 %   MCV 78.7 78.0 - 100.0 fL   MCH 27.2 26.0 - 34.0 pg   MCHC 34.6 30.0 - 36.0 g/dL   RDW 23.5 57.3 - 22.0 %   Platelets 255 150 - 400 K/uL  Comprehensive metabolic panel     Status: Abnormal   Collection Time: 05/06/18 11:02 AM  Result Value Ref Range   Sodium 133 (L) 135 - 145 mmol/L   Potassium 3.2 (L) 3.5 - 5.1 mmol/L   Chloride 99 98 - 111 mmol/L   CO2 22 22 - 32 mmol/L   Glucose, Bld 84 70 - 99 mg/dL   BUN <5 (L) 6 - 20 mg/dL   Creatinine, Ser 2.54 0.44 - 1.00 mg/dL   Calcium 8.9 8.9 - 27.0 mg/dL   Total Protein 7.1 6.5 - 8.1 g/dL   Albumin 3.4 (L) 3.5 - 5.0 g/dL   AST 37 15 - 41 U/L   ALT 60 (H) 0 - 44 U/L   Alkaline Phosphatase 85 38 - 126 U/L   Total Bilirubin 0.8 0.3 - 1.2 mg/dL   GFR calc non Af Amer >60 >60 mL/min   GFR calc Af Amer >60 >60 mL/min   Anion gap 12 5 - 15    O/RH(D) POSITIVE/-- (05/09 1526)  IMAGING No results found.  MAU Management/MDM: UA, CBC, CMP, amnisure Cerclage wnl, no evidence of cervical shortening, preterm labor, or PPROM Hyperemesis gravidarium, with dehydration and hypokalemia contributing to dizziness and syncope D5LR x 1000 ml, Phenergan 25 mg IV, Zofran 4 mg IV, and Pepcid 20 mg IVPB given Pt with minimal improvement, 1-2 sips of clear liquids tolerated Robinul 0.2 mg IV and 2 additional bags of IV fluids given, LR and LR with 10 meqs Potassium Consult Dr Macon Large and Dr Earlene Plater with assessment  and findings.   Admit to [facility] Unit for hyperemesis management, start medrol IV, then PO    ASSESSMENT 1. Hyperemesis affecting pregnancy, antepartum   2. Obesity in pregnancy   3. Acute hyperkalemia   4. Syncope, unspecified syncope type     PLAN Admit to [facility] Medrol IV then PO Phenergan, Zofran, Pepcid, Robinul ordered IV LR x 1000 ml with 10 meq of K @125 , followed by LR continuous at 125 Advance diet from clear liquids to regular diet Doppler Q shift   Sharen Counter Certified Nurse-Midwife 05/06/2018  4:20 PM

## 2018-05-06 NOTE — Telephone Encounter (Signed)
Pt called stating that she " fainted while on the toilet last night and woke up on the floor". Pt had an appt scheduled here in the office this afternoon and wanted to see if she should go to MAU or wait and come to her appt this afternoon. I spoke with Wynelle BourgeoisMarie Williams, CNM and she said that the pt can wait for her appt this afternoon but she should drink fluids and take it easy until her appt this afternoon. Hilda LiasMarie said if she cannot keep fluids/food down then she should go to MAU because she may be dehydrated and may need fluids. Pt states that she cannot keep anything down and she feels more comfortable going to MAU. Pt also requests that I cancel her appt for this afternoon.

## 2018-05-06 NOTE — MAU Provider Note (Signed)
Chief Complaint: Nausea; Emesis; and Fatigue   First Provider Initiated Contact with Patient 05/06/18 1021      SUBJECTIVE HPI: Samantha Guzman is a 32 y.o. G5P2202 at [redacted]w[redacted]d by LMP, with hyperemesis and hx cervical incompetence, s/p cerclage on 04/26/18, who presents to maternity admissions reporting a syncopal episode at home today with worsening n/v, and leaking some clear discharge x 3 days.  She reports a hx of hyperemesis, and is taking phenergan suppositories and Zofran but is not able to keep down food or fluids. She has lost ~25 lbs this pregnancy.  She reports feeling dizzy and fatigued for weeks, and then today she was on the toilet and woke up on the floor. She does not believe she hit her head or abdomen and denies any known injury. She reports that the clear discharge is not a large amount, has not required a pad, but has been persisted x 3 days. She has not tried any treatments. There are no other associated symptoms.   HPI  Past Medical History:  Diagnosis Date  . Anxiety   . Depression    brief hx, ok now  . GERD (gastroesophageal reflux disease)   . Hyperemesis   . Incompetent cervix in pregnancy   . Infection    UTI  . No significant past medical history   . Vertigo    Past Surgical History:  Procedure Laterality Date  . CERVICAL CERCLAGE N/A 08/09/2013   Procedure: CERCLAGE CERVICAL;  Surgeon: Charles A Harper, MD;  Location: [facility];  Service: Gynecology;  Laterality: N/A;  . CERVICAL CERCLAGE N/A 04/26/2018   Procedure: CERCLAGE CERVICAL;  Surgeon: Pratt, Tanya S, MD;  Location: [facility];  Service: Gynecology;  Laterality: N/A;  . CESAREAN SECTION    . CESAREAN SECTION N/A 02/02/2014   Procedure: REPEAT CESAREAN SECTION;  Surgeon: Charles A Harper, MD;  Location: [facility];  Service: Obstetrics;  Laterality: N/A;   Social History   Socioeconomic History  . Marital status: Single    Spouse name: Not on file  . Number of children: Not on file  . Years of education: Not  on file  . Highest education level: Not on file  Occupational History  . Not on file  Social Needs  . Financial resource strain: Not on file  . Food insecurity:    Worry: Not on file    Inability: Not on file  . Transportation needs:    Medical: Not on file    Non-medical: Not on file  Tobacco Use  . Smoking status: Former Smoker    Types: Cigarettes    Last attempt to quit: 06/06/2009    Years since quitting: 8.9  . Smokeless tobacco: Never Used  Substance and Sexual Activity  . Alcohol use: No  . Drug use: No  . Sexual activity: Not Currently    Partners: Male  Lifestyle  . Physical activity:    Days per week: Not on file    Minutes per session: Not on file  . Stress: Not on file  Relationships  . Social connections:    Talks on phone: Not on file    Gets together: Not on file    Attends religious service: Not on file    Active member of club or organization: Not on file    Attends meetings of clubs or organizations: Not on file    Relationship status: Not on file  . Intimate partner violence:    Fear of current or ex partner: Not   on file    Emotionally abused: Not on file    Physically abused: Not on file    Forced sexual activity: Not on file  Other Topics Concern  . Not on file  Social History Narrative  . Not on file   No current facility-administered medications on file prior to encounter.    No current outpatient medications on file prior to encounter.   Allergies  Allergen Reactions  . Tramadol     Tramadol caused itching.    ROS:  Review of Systems  Constitutional: Negative for chills, fatigue and fever.  Eyes: Negative for visual disturbance.  Respiratory: Negative for shortness of breath.   Cardiovascular: Negative for chest pain.  Gastrointestinal: Negative for abdominal pain, nausea and vomiting.  Genitourinary: Negative for difficulty urinating, dysuria, flank pain, pelvic pain, vaginal bleeding, vaginal discharge and vaginal pain.   Neurological: Negative for dizziness and headaches.  Psychiatric/Behavioral: Negative.      I have reviewed patient's Past Medical Hx, Surgical Hx, Family Hx, Social Hx, medications and allergies.   Physical Exam   Patient Vitals for the past 24 hrs:  BP Temp Temp src Pulse Resp SpO2 Height Weight  05/06/18 1549 132/81 98.7 F (37.1 C) Oral 82 18 100 % - -  05/06/18 1002 (!) 143/77 98.6 F (37 C) Oral 98 18 99 % - -  05/06/18 0954 - - - - - - 5' 6" (1.676 m) 220 lb (99.8 kg)   Constitutional: Well-developed, well-nourished female in moderate distress.  Cardiovascular: normal rate Respiratory: normal effort GI: Abd soft, non-tender. Pos BS x 4 MS: Extremities nontender, no edema, normal ROM Neurologic: Alert and oriented x 4.  GU: Neg CVAT.  PELVIC EXAM: Cervix pink, visually closed, without lesion, cerclage in place and wnl, small amount white creamy discharge, vaginal walls and external genitalia normal   Cervix 0/long/high, firm, anterior, cerclage palpable wnl   FHT 142 by doppler  LAB RESULTS Results for orders placed or performed during the hospital encounter of 05/06/18 (from the past 24 hour(s))  Urinalysis, Routine w reflex microscopic     Status: Abnormal   Collection Time: 05/06/18 10:15 AM  Result Value Ref Range   Color, Urine AMBER (A) YELLOW   APPearance HAZY (A) CLEAR   Specific Gravity, Urine 1.030 1.005 - 1.030   pH 6.0 5.0 - 8.0   Glucose, UA NEGATIVE NEGATIVE mg/dL   Hgb urine dipstick NEGATIVE NEGATIVE   Bilirubin Urine SMALL (A) NEGATIVE   Ketones, ur 80 (A) NEGATIVE mg/dL   Protein, ur 100 (A) NEGATIVE mg/dL   Nitrite NEGATIVE NEGATIVE   Leukocytes, UA NEGATIVE NEGATIVE   RBC / HPF 0-5 0 - 5 RBC/hpf   WBC, UA 6-10 0 - 5 WBC/hpf   Bacteria, UA NONE SEEN NONE SEEN   Squamous Epithelial / LPF 6-10 0 - 5   Mucus PRESENT   Amnisure rupture of membrane (rom)not at [facility]     Status: None   Collection Time: 05/06/18 10:42 AM  Result Value Ref  Range   Amnisure ROM NEGATIVE   Wet prep, genital     Status: Abnormal   Collection Time: 05/06/18 10:42 AM  Result Value Ref Range   Yeast Wet Prep HPF POC NONE SEEN NONE SEEN   Trich, Wet Prep NONE SEEN NONE SEEN   Clue Cells Wet Prep HPF POC PRESENT (A) NONE SEEN   WBC, Wet Prep HPF POC MANY (A) NONE SEEN   Sperm NONE SEEN   CBC       Status: Abnormal   Collection Time: 05/06/18 11:02 AM  Result Value Ref Range   WBC 9.8 4.0 - 10.5 K/uL   RBC 4.37 3.87 - 5.11 MIL/uL   Hemoglobin 11.9 (L) 12.0 - 15.0 g/dL   HCT 34.4 (L) 36.0 - 46.0 %   MCV 78.7 78.0 - 100.0 fL   MCH 27.2 26.0 - 34.0 pg   MCHC 34.6 30.0 - 36.0 g/dL   RDW 13.1 11.5 - 15.5 %   Platelets 255 150 - 400 K/uL  Comprehensive metabolic panel     Status: Abnormal   Collection Time: 05/06/18 11:02 AM  Result Value Ref Range   Sodium 133 (L) 135 - 145 mmol/L   Potassium 3.2 (L) 3.5 - 5.1 mmol/L   Chloride 99 98 - 111 mmol/L   CO2 22 22 - 32 mmol/L   Glucose, Bld 84 70 - 99 mg/dL   BUN <5 (L) 6 - 20 mg/dL   Creatinine, Ser 0.53 0.44 - 1.00 mg/dL   Calcium 8.9 8.9 - 10.3 mg/dL   Total Protein 7.1 6.5 - 8.1 g/dL   Albumin 3.4 (L) 3.5 - 5.0 g/dL   AST 37 15 - 41 U/L   ALT 60 (H) 0 - 44 U/L   Alkaline Phosphatase 85 38 - 126 U/L   Total Bilirubin 0.8 0.3 - 1.2 mg/dL   GFR calc non Af Amer >60 >60 mL/min   GFR calc Af Amer >60 >60 mL/min   Anion gap 12 5 - 15    O/RH(D) POSITIVE/-- (05/09 1526)  IMAGING No results found.  MAU Management/MDM: UA, CBC, CMP, amnisure Cerclage wnl, no evidence of cervical shortening, preterm labor, or PPROM Hyperemesis gravidarium, with dehydration and hypokalemia contributing to dizziness and syncope D5LR x 1000 ml, Phenergan 25 mg IV, Zofran 4 mg IV, and Pepcid 20 mg IVPB given Pt with minimal improvement, 1-2 sips of clear liquids tolerated Robinul 0.2 mg IV and 2 additional bags of IV fluids given, LR and LR with 10 meqs Potassium Consult Dr Anyanwu and Dr Davis with assessment  and findings.   Admit to [facility] Unit for hyperemesis management, start medrol IV, then PO    ASSESSMENT 1. Hyperemesis affecting pregnancy, antepartum   2. Obesity in pregnancy   3. Acute hyperkalemia   4. Syncope, unspecified syncope type     PLAN Admit to HROB Medrol IV then PO Phenergan, Zofran, Pepcid, Robinul ordered IV LR x 1000 ml with 10 meq of K @125, followed by LR continuous at 125 Advance diet from clear liquids to regular diet Doppler Q shift   Jerime Arif Leftwich-Kirby Certified Nurse-Midwife 05/06/2018  4:20 PM    

## 2018-05-07 DIAGNOSIS — O211 Hyperemesis gravidarum with metabolic disturbance: Secondary | ICD-10-CM | POA: Diagnosis present

## 2018-05-07 DIAGNOSIS — E669 Obesity, unspecified: Secondary | ICD-10-CM | POA: Diagnosis present

## 2018-05-07 DIAGNOSIS — Z3A15 15 weeks gestation of pregnancy: Secondary | ICD-10-CM | POA: Diagnosis not present

## 2018-05-07 DIAGNOSIS — O99212 Obesity complicating pregnancy, second trimester: Secondary | ICD-10-CM | POA: Diagnosis present

## 2018-05-07 DIAGNOSIS — Z87891 Personal history of nicotine dependence: Secondary | ICD-10-CM | POA: Diagnosis not present

## 2018-05-07 DIAGNOSIS — O26892 Other specified pregnancy related conditions, second trimester: Secondary | ICD-10-CM | POA: Diagnosis present

## 2018-05-07 DIAGNOSIS — O3432 Maternal care for cervical incompetence, second trimester: Secondary | ICD-10-CM | POA: Diagnosis present

## 2018-05-07 DIAGNOSIS — R55 Syncope and collapse: Secondary | ICD-10-CM | POA: Diagnosis present

## 2018-05-07 LAB — COMPREHENSIVE METABOLIC PANEL
ALT: 55 U/L — ABNORMAL HIGH (ref 0–44)
AST: 32 U/L (ref 15–41)
Albumin: 3 g/dL — ABNORMAL LOW (ref 3.5–5.0)
Alkaline Phosphatase: 77 U/L (ref 38–126)
Anion gap: 9 (ref 5–15)
BUN: <5 mg/dL — ABNORMAL LOW (ref 6–20)
CALCIUM: 8.5 mg/dL — AB (ref 8.9–10.3)
CHLORIDE: 103 mmol/L (ref 98–111)
CO2: 22 mmol/L (ref 22–32)
CREATININE: 0.7 mg/dL (ref 0.44–1.00)
GFR calc non Af Amer: >60 mL/min (ref >60)
Glucose, Bld: 93 mg/dL (ref 70–99)
Potassium: 2.9 mmol/L — ABNORMAL LOW (ref 3.5–5.1)
SODIUM: 134 mmol/L — AB (ref 135–145)
Total Bilirubin: 0.4 mg/dL (ref 0.3–1.2)
Total Protein: 6.7 g/dL (ref 6.5–8.1)

## 2018-05-07 LAB — GLUCOSE, CAPILLARY: GLUCOSE-CAPILLARY: 89 mg/dL (ref 70–99)

## 2018-05-07 LAB — GLUCOSE, RANDOM: Glucose, Bld: 98 mg/dL (ref 70–99)

## 2018-05-07 NOTE — Progress Notes (Signed)
Initial Nutrition Assessment  DOCUMENTATION CODES:  Obesity unspecified  INTERVENTION:  Regular Diet, snacks TID Ensure Enlive is a supplement option, BID Please reweigh pt once rehydrated  NUTRITION DIAGNOSIS:  Inadequate oral intake related to nausea, vomiting as evidenced by percent weight loss. GOAL:  Patient will meet greater than or equal to 90% of their needs, Weight gain  MONITOR:  Weight trends  REASON FOR ASSESSMENT:  Other (Comment)(Hyperemesis)   ASSESSMENT:   15 2/7 weeks adm with hyperemesis/ steroid taper. Weight at initial prenatal visit 246 Lbs, BMI 39.7. Weight this adm 220 lbs, down 10.5% from usual weight  Significance of weight loss puts pt at risk for malnutrition. Weight should be obtained after hydration   Diet Order:   Diet Order           Diet regular Room service appropriate? Yes; Fluid consistency: Thin  Diet effective now         EDUCATION NEEDS:   No education needs have been identified at this time  Skin:  Skin Assessment: Reviewed RN Assessment  Height:   Ht Readings from Last 1 Encounters:  05/06/18 5\' 6"  (1.676 m)    Weight:   Wt Readings from Last 1 Encounters:  05/06/18 220 lb (99.8 kg)   Ideal Body Weight:   130 lbs  BMI:  Body mass index is 35.51 kg/m.  Estimated Nutritional Needs:   Kcal:  2200-2400  Protein:  100- 110 g  Fluid:  2.5 L    Elisabeth CaraKatherine Adis Sturgill M.Odis LusterEd. R.D. LDN Neonatal Nutrition Support Specialist/RD III Pager [phone]      Phone [phone]

## 2018-05-07 NOTE — Progress Notes (Signed)
FACULTY PRACTICE ANTEPARTUM PROGRESS NOTE  Samantha NapoleonRenee Guzman is a 32 y.o. [id number] at 185w2d who is admitted for hyperemesis.  Estimated Date of Delivery: 10/27/18  Length of Stay:  0 Days. Admitted 05/06/2018  Subjective:  Patient reports feeling slightly better this am, she is sleepy. Denies vomiting yet this am, has not been up yet, still with some nausea but that has improved with IVFs. She denies uterine contractions, denies bleeding and leaking of fluid per vagina.  Vitals:  Blood pressure 116/64, pulse (!) 59, temperature 98.6 F (37 C), temperature source Oral, resp. rate 20, height 5\' 6"  (1.676 m), weight 220 lb (99.8 kg), last menstrual period 01/14/2018, SpO2 100 %. Physical Examination: CONSTITUTIONAL: Well-developed, well-nourished female in no acute distress. Appears fatigued NEUROLGIC: Alert and oriented to person, place, and time. Normal reflexes, muscle tone coordination. No cranial nerve deficit noted. PSYCHIATRIC: Normal mood and affect. Normal behavior. Normal judgment and thought content. CARDIOVASCULAR: Normal heart rate noted, regular rhythm RESPIRATORY: Effort and breath sounds normal, no problems with respiration noted MUSCULOSKELETAL: Normal range of motion. No edema and no tenderness. ABDOMEN: Soft, nontender, nondistended CERVIX: deferred  No results found.  Current scheduled medications . glycopyrrolate  0.2 mg Intravenous TID  . methylPREDNISolone  16 mg Oral Q breakfast   Followed by  . [START ON 05/11/2018] methylPREDNISolone  8 mg Oral Q breakfast   Followed by  . [START ON 05/18/2018] methylPREDNISolone  4 mg Oral Q breakfast  . methylPREDNISolone  16 mg Oral Q1400   Followed by  . [START ON 05/09/2018] methylPREDNISolone  8 mg Oral Q1400   Followed by  . [START ON 05/12/2018] methylPREDNISolone  4 mg Oral Q1400  . methylPREDNISolone  16 mg Oral QHS   Followed by  . [START ON 05/10/2018] methylPREDNISolone  8 mg Oral QHS   Followed by  . [START ON 05/13/2018]  methylPREDNISolone  4 mg Oral QHS  . ondansetron (ZOFRAN) IV  4 mg Intravenous Q6H  . scopolamine  1 patch Transdermal Q72H    I have reviewed the patient's current medications.  ASSESSMENT: Active Problems:   Hyperemesis gravidarum before end of [redacted] week gestation with electrolyte imbalance   PLAN: Cont zofran, phenergan Cont steroid taper Cont IVFs Cont to advance diet as tolaterated   Continue routine antenatal care.   Baldemar LenisK. Meryl Davis, M.D. Attending Obstetrician & Gynecologist, [facility] for [facility], [facility]  05/07/2018 9:20 AM

## 2018-05-08 DIAGNOSIS — E876 Hypokalemia: Secondary | ICD-10-CM | POA: Diagnosis present

## 2018-05-08 DIAGNOSIS — O3430 Maternal care for cervical incompetence, unspecified trimester: Secondary | ICD-10-CM

## 2018-05-08 LAB — COMPREHENSIVE METABOLIC PANEL
ALBUMIN: 2.9 g/dL — AB (ref 3.5–5.0)
ALK PHOS: 65 U/L (ref 38–126)
ALK PHOS: 69 U/L (ref 38–126)
ALT: 61 U/L — AB (ref 0–44)
ALT: 66 U/L — ABNORMAL HIGH (ref 0–44)
ANION GAP: 9 (ref 5–15)
AST: 37 U/L (ref 15–41)
AST: 40 U/L (ref 15–41)
Albumin: 2.8 g/dL — ABNORMAL LOW (ref 3.5–5.0)
Anion gap: 9 (ref 5–15)
CALCIUM: 8.2 mg/dL — AB (ref 8.9–10.3)
CALCIUM: 8.5 mg/dL — AB (ref 8.9–10.3)
CO2: 22 mmol/L (ref 22–32)
CO2: 23 mmol/L (ref 22–32)
CREATININE: 0.6 mg/dL (ref 0.44–1.00)
Chloride: 104 mmol/L (ref 98–111)
Chloride: 102 mmol/L (ref 98–111)
Creatinine, Ser: 0.52 mg/dL (ref 0.44–1.00)
GFR calc Af Amer: >60 mL/min (ref >60)
GFR calc Af Amer: >60 mL/min (ref >60)
GLUCOSE: 91 mg/dL (ref 70–99)
Glucose, Bld: 83 mg/dL (ref 70–99)
POTASSIUM: 3.1 mmol/L — AB (ref 3.5–5.1)
Potassium: 2.9 mmol/L — ABNORMAL LOW (ref 3.5–5.1)
SODIUM: 135 mmol/L (ref 135–145)
SODIUM: 134 mmol/L — AB (ref 135–145)
Total Bilirubin: 0.3 mg/dL (ref 0.3–1.2)
Total Bilirubin: 0.2 mg/dL — ABNORMAL LOW (ref 0.3–1.2)
Total Protein: 6.1 g/dL — ABNORMAL LOW (ref 6.5–8.1)
Total Protein: 5.9 g/dL — ABNORMAL LOW (ref 6.5–8.1)

## 2018-05-08 LAB — GLUCOSE, CAPILLARY: Glucose-Capillary: 99 mg/dL (ref 70–99)

## 2018-05-08 MED ORDER — METHYLPREDNISOLONE 16 MG PO TABS: 16.0000 mg | ORAL_TABLET | ORAL | 0 refills | Status: DC

## 2018-05-08 MED ORDER — ONDANSETRON 4 MG PO TBDP: 4.0000 mg | ORAL_TABLET | Freq: Four times a day (QID) | ORAL | 3 refills | Status: DC | PRN

## 2018-05-08 MED ORDER — METHYLPREDNISOLONE 8 MG PO TABS: 8.0000 mg | ORAL_TABLET | ORAL | 0 refills | Status: DC

## 2018-05-08 MED ORDER — GLUCOSE BLOOD VI STRP: ORAL_STRIP | 12 refills | Status: DC

## 2018-05-08 MED ORDER — ACCU-CHEK NANO SMARTVIEW W/DEVICE KIT: 1.0000 | PACK | 0 refills | Status: DC

## 2018-05-08 MED ORDER — POTASSIUM CHLORIDE 10 MEQ/100ML IV SOLN
10.0000 meq | INTRAVENOUS | Status: AC
  Administered 2018-05-08 (×3): 10 meq via INTRAVENOUS
  Filled 2018-05-08 (×3): qty 100

## 2018-05-08 MED ORDER — POTASSIUM CHLORIDE 2 MEQ/ML IV SOLN
INTRAVENOUS | Status: DC
  Administered 2018-05-08: 14:00:00 via INTRAVENOUS
  Filled 2018-05-08 (×3): qty 1000

## 2018-05-08 MED ORDER — PROMETHAZINE HCL 25 MG RE SUPP: 25.0000 mg | Freq: Four times a day (QID) | RECTAL | 1 refills | Status: DC | PRN

## 2018-05-08 MED ORDER — KCL-LACTATED RINGERS 20 MEQ/L IV SOLN: INTRAVENOUS | Status: DC

## 2018-05-08 MED ORDER — METHYLPREDNISOLONE 4 MG PO TABS: 4.0000 mg | ORAL_TABLET | ORAL | 0 refills | Status: DC

## 2018-05-08 NOTE — Discharge Summary (Signed)
Antenatal Physician Discharge Summary  Patient ID: Samantha Guzman MRN: [id number] DOB/AGE: [birth year] 30 y.o.  Admit date: 05/06/2018 Discharge date: 05/08/2018  Admission Diagnoses: hyperemesis, hypokalemia  Discharge Diagnoses:  Active Problems:   High-risk pregnancy   Cervical incompetence, antepartum condition or complication   Previous cesarean section   Hyperemesis gravidarum before end of [redacted] week gestation with electrolyte imbalance   Hypokalemia   Cervical cerclage suture present   Prenatal Procedures: ultrasound  Consults: none  Hospital Course:  This is a 32 y.o. [id number] with IUP at 88w3dadmitted for hyperemesis gravidarum in the setting of 25 pound weight loss and hypokalemia. She improved on IVFs, anti-emetics and a steroid taper. Also received potassium for repletion. By HD#3, she was tolerating small amounts of regular diet and felt much better. She was discharged home with medrol taper and instructions for checking fasting blood glucose at home. She will f/u with appointment in 1 week. To call with any issues.   Discharge Exam: Temp:  [97.8 F (36.6 C)-98.9 F (37.2 C)] 98.9 F (37.2 C) (07/03 1610) Pulse Rate:  [52-72] 56 (07/03 1610) Resp:  [17-20] 20 (07/03 1610) BP: (112-128)/(57-76) 128/76 (07/03 1610) SpO2:  [96 %-100 %] 96 % (07/03 1610) Weight:  [232 lb 12 oz (105.6 kg)] 232 lb 12 oz (105.6 kg) (07/03 01308 Physical Examination: CONSTITUTIONAL: Well-developed, well-nourished female in no acute distress.  HENT:  Normocephalic, atraumatic, External right and left ear normal. Oropharynx is clear and moist EYES: Conjunctivae and EOM are normal. Pupils are equal, round, and reactive to light. No scleral icterus.  NECK: Normal range of motion, supple, no masses SKIN: Skin is warm and dry. No rash noted. Not diaphoretic. No erythema. No pallor. [place] Alert and oriented to person, place, and time. Normal reflexes, muscle tone coordination. No cranial nerve  deficit noted. PSYCHIATRIC: Normal mood and affect. Normal behavior. Normal judgment and thought content. CARDIOVASCULAR: Normal heart rate noted, regular rhythm RESPIRATORY: Effort and breath sounds normal, no problems with respiration noted MUSCULOSKELETAL: Normal range of motion. No edema and no tenderness. 2+ distal pulses. ABDOMEN: Soft, nontender, nondistended, gravid. CERVIX:  deferred   Significant Diagnostic Studies:   Discharge Condition: Stable  Disposition: Discharge disposition: 01-Home or Self Care     Instructions for Medrol taper given as below.  MEDROL (METHYLPREDNISOLONE) TAPER  You are starting on Day 4! Please call the office if you have any questions.   Date  Day  Morning  Midday  Bedtime    '1       2       3      ' July '4 4  16 ' mg  8 mg  16 mg   July '5 5  16 ' mg  8 mg  8 mg   July '6 6  8 ' mg  8 mg  8 mg   July '7 7  8 ' mg  4 mg  8 mg   July '8 8  8 ' mg  4 mg  4 mg   July '9 9  8 ' mg  4 mg    July '10 10  8 ' mg  4 mg    July '11 11  8 ' mg     July '12 12  8 ' mg     July '13 13  4 ' mg     July '14 14  4 ' mg        Discharge Instructions    Discharge instructions   Complete by:  As directed  Please call the office if you have any questions about your medications.     Allergies as of 05/08/2018      Reactions   Tramadol    Tramadol caused itching.      Medication List    TAKE these medications   ACCU-CHEK NANO SMARTVIEW w/Device Kit 1 kit by Subdermal route as directed. Check blood sugars when you first wake up before eating or drinking anything.   glucose blood test strip Commonly known as:  ACCU-CHEK SMARTVIEW Use as instructed to check blood sugars   methylPREDNISolone 4 MG tablet Commonly known as:  MEDROL Take 1 tablet (4 mg total) by mouth as directed.   methylPREDNISolone 8 MG tablet Commonly known as:  MEDROL Take 1 tablet (8 mg total) by mouth as directed.   methylPREDNISolone 16 MG tablet Commonly known as:   MEDROL Take 1 tablet (16 mg total) by mouth as directed.   ondansetron 4 MG disintegrating tablet Commonly known as:  ZOFRAN ODT Take 1 tablet (4 mg total) by mouth every 6 (six) hours as needed for nausea.   promethazine 25 MG suppository Commonly known as:  PHENERGAN Place 1 suppository (25 mg total) rectally every 6 (six) hours as needed for nausea or vomiting.      Follow-up [place] for [facility] [place] Follow up on 05/16/2018.   Specialty:  Obstetrics and Gynecology Contact information: [address] [place], [place]. Go on 05/23/2018.   Specialty:  Maternal and Fetal Medicine Contact information: [address] [id number] [place] [phone]          Signed: Feliz Beam, M.D. Attending [place], [facility] for [employer], [facility]  05/08/2018, 6:36 PM

## 2018-05-08 NOTE — Progress Notes (Signed)
FACULTY PRACTICE ANTEPARTUM PROGRESS NOTE  Samantha NapoleonRenee Weihe is a 32 y.o. [id number] at 7321w3d who is admitted for hyperemesis.  Estimated Date of Delivery: 10/27/18 Fetal presentation is unsure.  Length of Stay:  1 Days. Admitted 05/06/2018  Subjective:  Patient reports feeling better this am. She has had ongoing nausea but was able to keep small amounts of food down yesterday, has ordered breakfast for this am. She denies vomiting today. She denies uterine contractions, denies bleeding and leaking of fluid per vagina.  Vitals:  Blood pressure 127/65, pulse (!) 52, temperature 98.4 F (36.9 C), temperature source Oral, resp. rate 20, height 5\' 6"  (1.676 m), weight 232 lb 12 oz (105.6 kg), last menstrual period 01/14/2018, SpO2 99 %. Physical Examination: CONSTITUTIONAL: Well-developed, well-nourished female in no acute distress.  HENT:  Normocephalic, atraumatic, External right and left ear normal. Oropharynx is clear and moist EYES: Conjunctivae and EOM are normal. Pupils are equal, round, and reactive to light. No scleral icterus.  NECK: Normal range of motion, supple, no masses. SKIN: Skin is warm and dry. No rash noted. Not diaphoretic. No erythema. No pallor. NEUROLGIC: Alert and oriented to person, place, and time. Normal reflexes, muscle tone coordination. No cranial nerve deficit noted. PSYCHIATRIC: Normal mood and affect. Normal behavior. Normal judgment and thought content. CARDIOVASCULAR: Normal heart rate noted, regular rhythm RESPIRATORY: Effort and breath sounds normal, no problems with respiration noted MUSCULOSKELETAL: Normal range of motion. No edema and no tenderness. ABDOMEN: Soft, nontender, nondistended, gravid. CERVIX: deferred  No results found.  Current scheduled medications . glycopyrrolate  0.2 mg Intravenous TID  . methylPREDNISolone  16 mg Oral Q breakfast   Followed by  . [START ON 05/11/2018] methylPREDNISolone  8 mg Oral Q breakfast   Followed by  . [START ON  05/18/2018] methylPREDNISolone  4 mg Oral Q breakfast  . methylPREDNISolone  16 mg Oral Q1400   Followed by  . [START ON 05/09/2018] methylPREDNISolone  8 mg Oral Q1400   Followed by  . [START ON 05/12/2018] methylPREDNISolone  4 mg Oral Q1400  . methylPREDNISolone  16 mg Oral QHS   Followed by  . [START ON 05/10/2018] methylPREDNISolone  8 mg Oral QHS   Followed by  . [START ON 05/13/2018] methylPREDNISolone  4 mg Oral QHS  . ondansetron (ZOFRAN) IV  4 mg Intravenous Q6H  . scopolamine  1 patch Transdermal Q72H    I have reviewed the patient's current medications.  ASSESSMENT: Active Problems:   Hyperemesis gravidarum before end of [redacted] week gestation with electrolyte imbalance   Hypokalemia   PLAN: Replete K+ via IV infusion this am Cont steroid taper Advance diet as tolerated Cont IVFs Cont zofran/phenergan   Continue routine antenatal care.   Baldemar LenisK. Meryl Henriette Hesser, M.D. Attending Obstetrician & Gynecologist, [facility], [facility]  05/08/2018 10:53 AM

## 2018-05-08 NOTE — Discharge Instructions (Signed)
MEDROL (METHYLPREDNISOLONE) TAPER  You are starting on Day 4! Please call the office if you have any questions.   Date  Day  Morning  Midday  Bedtime    1       2       3       July 4 4  16  mg  8 mg  16 mg   July 5 5  16  mg  8 mg  8 mg   July 6 6  8  mg  8 mg  8 mg   July 7 7  8  mg  4 mg  8 mg   July 8 8  8  mg  4 mg  4 mg   July 9 9  8  mg  4 mg    July 10 10  8  mg  4 mg    July 11 11  8  mg     July 12 12  8  mg     July 13 13  4  mg     July 14 14  4  mg      Check fasting blood sugars daily while on the taper. Call office if fasting blood sugar>95.        Eating Plan for Hyperemesis Gravidarum Hyperemesis gravidarum is a severe form of morning sickness. Because this condition causes severe nausea and vomiting, it can lead to dehydration, malnutrition, and weight loss. One way to lessen the symptoms of nausea and vomiting is to follow the eating plan for hyperemesis gravidarum. It is often used along with prescribed medicines to control your symptoms. What can I do to relieve my symptoms? Listen to your body. Everyone is different and has different preferences. Find what works best for you. Take any of the following actions that are helpful to you:  Eat and drink slowly.  Eat 5-6 small meals daily instead of 3 large meals.  Eat crackers before you get out of bed in the morning.  Try having a snack in the middle of the night.  Starchy foods are usually tolerated well. Examples include cereal, toast, bread, potatoes, pasta, rice, and pretzels.  Ginger may help with nausea. Add  tsp ground ginger to hot tea or choose ginger tea.  Try drinking 100% fruit juice or an electrolyte drink. An electrolyte drink contains sodium, potassium, and chloride.  Continue to take your prenatal vitamins as told by your health care provider. If you are having trouble taking your prenatal vitamins, talk with your health care provider about different options.  Include at least 1 serving of protein  with your meals and snacks. Protein options include meats or poultry, beans, nuts, eggs, and yogurt. Try eating a protein-rich snack before bed. Examples of these snacks include cheese and crackers or half of a peanut butter or [place] sandwich.  Consider eliminating foods that trigger your symptoms. These may include spicy foods, coffee, high-fat foods, very sweet foods, and acidic foods.  Try meals that have more protein combined with bland, salty, lower-fat, and dry foods, such as nuts, seeds, pretzels, crackers, and cereal.  Talk with your healthcare provider about starting a supplement of vitamin B6.  Have fluids that are cold, clear, and carbonated or sour. Examples include lemonade, ginger ale, lemon-lime soda, ice water, and sparkling water.  Try lemon or mint tea.  Try brushing your teeth or using a mouth rinse after meals.  What should I avoid to reduce my symptoms? Avoiding some of the following things may help reduce your symptoms.  Foods with strong smells. Try eating meals in well-ventilated areas that are free of odors.  Drinking water or other beverages with meals. Try not to drink anything during the 30 minutes before and after your meals.  Drinking more than 1 cup of fluid at a time. Sometimes using a straw helps.  Fried or high-fat foods, such as butter and cream sauces.  Spicy foods.  Skipping meals as best as you can. Nausea can be more intense on an empty stomach. If you cannot tolerate food at that time, do not force it. Try sucking on ice chips or other frozen items, and make up for missed calories later.  Lying down within 2 hours after eating.  Environmental triggers. These may include smoky rooms, closed spaces, rooms with strong smells, warm or humid places, overly loud and noisy rooms, and rooms with motion or flickering lights.  Quick and sudden changes in your movement.  This information is not intended to replace advice given to you by your health  care provider. Make sure you discuss any questions you have with your health care provider. Document Released: 08/20/2007 Document Revised: 06/21/2016 Document Reviewed: 05/23/2016 Elsevier Interactive Patient Education  [employer].

## 2018-05-08 NOTE — Discharge Summary (Signed)
Discharge instructions per the AVS reviewed with the patient. Rx reviewed (time to take next dose, meds, and where to pick up meds).  All questions answered. Pt. Was ready to d/c.

## 2018-05-16 ENCOUNTER — Inpatient Hospital Stay (HOSPITAL_COMMUNITY)
Admission: AD | Admit: 2018-05-16 | Discharge: 2018-05-16 | Disposition: A | Discharge status: Home / Self Care | Payer: Medicaid Other | Source: Ambulatory Visit | Attending: Obstetrics and Gynecology | Admitting: Obstetrics and Gynecology

## 2018-05-16 ENCOUNTER — Other Ambulatory Visit: Payer: Self-pay

## 2018-05-16 ENCOUNTER — Encounter (HOSPITAL_COMMUNITY): Payer: Self-pay | Admitting: *Deleted

## 2018-05-16 ENCOUNTER — Ambulatory Visit (INDEPENDENT_AMBULATORY_CARE_PROVIDER_SITE_OTHER): Payer: Medicaid Other | Admitting: Obstetrics & Gynecology

## 2018-05-16 ENCOUNTER — Encounter (HOSPITAL_COMMUNITY): Payer: Self-pay

## 2018-05-16 VITALS — BP 136/93 | HR 106 | Wt 224.0 lb

## 2018-05-16 DIAGNOSIS — R109 Unspecified abdominal pain: Secondary | ICD-10-CM | POA: Insufficient documentation

## 2018-05-16 DIAGNOSIS — F419 Anxiety disorder, unspecified: Secondary | ICD-10-CM | POA: Diagnosis not present

## 2018-05-16 DIAGNOSIS — R1110 Vomiting, unspecified: Secondary | ICD-10-CM

## 2018-05-16 DIAGNOSIS — E860 Dehydration: Secondary | ICD-10-CM | POA: Diagnosis not present

## 2018-05-16 DIAGNOSIS — O3431 Maternal care for cervical incompetence, first trimester: Secondary | ICD-10-CM | POA: Diagnosis not present

## 2018-05-16 DIAGNOSIS — O219 Vomiting of pregnancy, unspecified: Secondary | ICD-10-CM

## 2018-05-16 DIAGNOSIS — O210 Mild hyperemesis gravidarum: Secondary | ICD-10-CM | POA: Diagnosis not present

## 2018-05-16 DIAGNOSIS — Z79899 Other long term (current) drug therapy: Secondary | ICD-10-CM | POA: Diagnosis not present

## 2018-05-16 DIAGNOSIS — O26892 Other specified pregnancy related conditions, second trimester: Secondary | ICD-10-CM | POA: Diagnosis not present

## 2018-05-16 DIAGNOSIS — Z3A16 16 weeks gestation of pregnancy: Secondary | ICD-10-CM | POA: Insufficient documentation

## 2018-05-16 DIAGNOSIS — O99210 Obesity complicating pregnancy, unspecified trimester: Secondary | ICD-10-CM

## 2018-05-16 DIAGNOSIS — Z888 Allergy status to other drugs, medicaments and biological substances status: Secondary | ICD-10-CM | POA: Diagnosis not present

## 2018-05-16 DIAGNOSIS — O99282 Endocrine, nutritional and metabolic diseases complicating pregnancy, second trimester: Secondary | ICD-10-CM | POA: Diagnosis not present

## 2018-05-16 DIAGNOSIS — K219 Gastro-esophageal reflux disease without esophagitis: Secondary | ICD-10-CM | POA: Diagnosis not present

## 2018-05-16 DIAGNOSIS — Z3482 Encounter for supervision of other normal pregnancy, second trimester: Secondary | ICD-10-CM

## 2018-05-16 DIAGNOSIS — O218 Other vomiting complicating pregnancy: Secondary | ICD-10-CM | POA: Diagnosis present

## 2018-05-16 DIAGNOSIS — O99612 Diseases of the digestive system complicating pregnancy, second trimester: Secondary | ICD-10-CM | POA: Diagnosis not present

## 2018-05-16 DIAGNOSIS — O99342 Other mental disorders complicating pregnancy, second trimester: Secondary | ICD-10-CM | POA: Diagnosis not present

## 2018-05-16 DIAGNOSIS — E876 Hypokalemia: Secondary | ICD-10-CM

## 2018-05-16 DIAGNOSIS — F329 Major depressive disorder, single episode, unspecified: Secondary | ICD-10-CM | POA: Diagnosis not present

## 2018-05-16 DIAGNOSIS — Z87891 Personal history of nicotine dependence: Secondary | ICD-10-CM | POA: Insufficient documentation

## 2018-05-16 LAB — URINALYSIS, ROUTINE W REFLEX MICROSCOPIC
BACTERIA UA: NONE SEEN
BILIRUBIN URINE: NEGATIVE
Glucose, UA: NEGATIVE mg/dL
HGB URINE DIPSTICK: NEGATIVE
Ketones, ur: 80 mg/dL — AB
NITRITE: NEGATIVE
Protein, ur: 30 mg/dL — AB
SPECIFIC GRAVITY, URINE: 1.028 (ref 1.005–1.030)
pH: 5 (ref 5.0–8.0)

## 2018-05-16 MED ORDER — METHYLPREDNISOLONE 16 MG PO TABS: 16.0000 mg | ORAL_TABLET | ORAL | 0 refills | Status: DC

## 2018-05-16 MED ORDER — M.V.I. ADULT IV INJ
Freq: Once | INTRAVENOUS | Status: AC
  Administered 2018-05-16: 18:00:00 via INTRAVENOUS
  Filled 2018-05-16: qty 10

## 2018-05-16 MED ORDER — METHYLPREDNISOLONE 8 MG PO TABS: 8.0000 mg | ORAL_TABLET | ORAL | 0 refills | Status: DC

## 2018-05-16 MED ORDER — ONDANSETRON HCL 4 MG/2ML IJ SOLN
4.0000 mg | Freq: Once | INTRAMUSCULAR | Status: AC
  Administered 2018-05-16: 4 mg via INTRAVENOUS
  Filled 2018-05-16: qty 2

## 2018-05-16 MED ORDER — METHYLPREDNISOLONE 4 MG PO TABS: 4.0000 mg | ORAL_TABLET | ORAL | 0 refills | Status: DC

## 2018-05-16 MED ORDER — METOCLOPRAMIDE HCL 5 MG/ML IJ SOLN
10.0000 mg | Freq: Once | INTRAMUSCULAR | Status: AC
  Administered 2018-05-16: 10 mg via INTRAVENOUS
  Filled 2018-05-16: qty 2

## 2018-05-16 MED ORDER — DEXTROSE IN LACTATED RINGERS 5 % IV SOLN
INTRAVENOUS | Status: DC
  Administered 2018-05-16: 17:00:00 via INTRAVENOUS

## 2018-05-16 NOTE — MAU Note (Signed)
Dr sent her in , hyperemesis. Pain in upper abd

## 2018-05-16 NOTE — Progress Notes (Signed)
Pt c/o vomiting everyday at least 6 times

## 2018-05-16 NOTE — MAU Provider Note (Signed)
History     CSN: [id number]  Arrival date and time: 05/16/18 1445   First Provider Initiated Contact with Patient 05/16/18 1612      Chief Complaint  Patient presents with  . Emesis   HPI  Samantha Guzman is a 32 y.o. [id number] at 18w4dwho presents to MAU from clinic for IV fluids. Denies vaginal bleeding, leaking of fluid, decreased fetal movement, fever, falls, or recent illness.    Nausea/Vomiting This is an ongoing problem this pregnancy. Patient states she has vomited approximately six times today. Patient previously experienced relief with prescribed Phenergan suppositories and Zofran ODT but reports that they no longer manage her nausea/vomiting.  States she has not had solid food in approximately four days.  Patient states she has not been able to get rx for steroid taper filled due to lack of supply at her pharmacy. Reports she is in the process of having home health arranged to provide IV antiemetics  Abdominal Cramping This is a new problem related to her vomiting episodes. Patient states she has mild muscle pain across the top of her abdomen that is associated with forceful vomiting. Describes as 4-6/10, bilateral across top of abdomen, does not radiate, vomiting aggravates, end of vomiting alleviates pain.  OB History    Gravida  5   Para  4   Term  2   Preterm  2   AB  0   Living  2     SAB  0   TAB      Ectopic      Multiple      Live Births  2           Past Medical History:  Diagnosis Date  . Anxiety   . Depression    brief hx, ok now  . GERD (gastroesophageal reflux disease)   . Hyperemesis   . Incompetent cervix in pregnancy   . Infection    UTI  . No significant past medical history   . Vertigo     Past Surgical History:  Procedure Laterality Date  . CERVICAL CERCLAGE N/A 08/09/2013   Procedure: CERCLAGE CERVICAL;  Surgeon: CShelly Bombard MD;  [facility];  Service: Gynecology;  Laterality: N/A;  . CERVICAL CERCLAGE N/A  04/26/2018   Procedure: CERCLAGE CERVICAL;  Surgeon: PDonnamae Jude MD;  [facility];  Service: Gynecology;  Laterality: N/A;  . CESAREAN SECTION    . CESAREAN SECTION N/A 02/02/2014   Procedure: REPEAT CESAREAN SECTION;  Surgeon: CShelly Bombard MD;  [facility];  Service: Obstetrics;  Laterality: N/A;    Family History  Problem Relation Age of Onset  . Stroke Father   . Heart disease Father     Social History   Tobacco Use  . Smoking status: Former Smoker    Types: Cigarettes    Last attempt to quit: 06/06/2009    Years since quitting: 8.9  . Smokeless tobacco: Never Used  Substance Use Topics  . Alcohol use: No  . Drug use: No    Allergies:  Allergies  Allergen Reactions  . Tramadol     Tramadol caused itching.    Medications Prior to Admission  Medication Sig Dispense Refill Last Dose  . famotidine (PEPCID) 20 MG tablet Take 20 mg by mouth 2 (two) times daily.   05/13/2018  . ondansetron (ZOFRAN ODT) 4 MG disintegrating tablet Take 1 tablet (4 mg total) by mouth every 6 (six) hours as needed for nausea. 2[place]  tablet 3 05/16/2018 at Unknown time  . promethazine (PHENERGAN) 25 MG suppository Place 1 suppository (25 mg total) rectally every 6 (six) hours as needed for nausea or vomiting. 30 each 1 05/16/2018 at Unknown time  . Blood Glucose Monitoring Suppl (ACCU-CHEK NANO SMARTVIEW) w/Device KIT 1 kit by Subdermal route as directed. Check blood sugars when you first wake up before eating or drinking anything. (Patient not taking: Reported on 05/16/2018) 1 kit 0 Not Taking  . glucose blood (ACCU-CHEK SMARTVIEW) test strip Use as instructed to check blood sugars (Patient not taking: Reported on 05/16/2018) 100 each 12 Not Taking  . methylPREDNISolone (MEDROL) 16 MG tablet Take 1 tablet (16 mg total) by mouth as directed. (Patient not taking: Reported on 05/16/2018) 3 tablet 0 Not Taking  . methylPREDNISolone (MEDROL) 4 MG tablet Take 1 tablet (4 mg total) by mouth as directed.  (Patient not taking: Reported on 05/16/2018) 7 tablet 0 Not Taking  . methylPREDNISolone (MEDROL) 8 MG tablet Take 1 tablet (8 mg total) by mouth as directed. (Patient not taking: Reported on 05/16/2018) 13 tablet 0 Not Taking    Review of Systems  Constitutional: Positive for appetite change and fatigue. Negative for chills and fever.  Respiratory: Negative for shortness of breath.   Gastrointestinal: Positive for abdominal pain, nausea and vomiting. Negative for diarrhea.  Genitourinary: Negative for pelvic pain, vaginal bleeding, vaginal discharge and vaginal pain.  All other systems reviewed and are negative.  Physical Exam   Blood pressure (!) 137/92, pulse 99, temperature 98.4 F (36.9 C), temperature source Oral, resp. rate 18, height _0  (1.676 m), weight 224 lb 4 oz (101.7 kg), last menstrual period 01/14/2018, SpO2 100 %.  Physical Exam  Nursing note and vitals reviewed. Constitutional: She is oriented to person, place, and time. She appears well-developed and well-nourished.  HENT:  Head: Normocephalic.  Cardiovascular: Normal rate, regular rhythm, normal heart sounds and intact distal pulses.  Respiratory: Effort normal and breath sounds normal.  GI:  Gravid nontender  Genitourinary: Vagina normal and uterus normal.  Musculoskeletal: Normal range of motion.  Neurological: She is alert and oriented to person, place, and time. She has normal reflexes.  Skin: Skin is warm and dry.  Psychiatric: She has a normal mood and affect. Her behavior is normal. Judgment and thought content normal.    MAU Course  Procedures  MDM N/V responding well to IV fluids and meds given in MAU Patient prefers to maintain current medication regimen until she determines possibility of home health Patient Vitals for the past 24 hrs:  BP Temp Temp src Pulse Resp SpO2 Height Weight  05/16/18 1656 - - - - - - _1  (1.676 m) 224 lb 4 oz (101.7 kg)  05/16/18 1543 (!) 137/92 98.4 F (36.9 C)  Oral 99 18 100 % - 224 lb 4 oz (101.7 kg)    Orders Placed This Encounter  Procedures  . Urinalysis, Routine w reflex microscopic    Standing Status:   Standing    Number of Occurrences:   1  . Insert peripheral IV    Standing Status:   Standing    Number of Occurrences:   1  . Discharge patient    Order Specific Question:   Discharge disposition    Answer:   01-Home or Self Care [1]    Order Specific Question:   Discharge patient date    Answer:   05/16/2018   Meds ordered this encounter  Medications  . multivitamins  adult (MVI -12) 10 mL in dextrose 5% lactated ringers 1,000 mL infusion  . metoCLOPramide (REGLAN) injection 10 mg  . dextrose 5 % in lactated ringers infusion  . ondansetron (ZOFRAN) injection 4 mg  . methylPREDNISolone (MEDROL) 16 MG tablet    Sig: Take 1 tablet (16 mg total) by mouth as directed.    Dispense:  3 tablet    Refill:  0    Order Specific Question:   Supervising Provider    Answer:   Donnamae Jude [[id number]]  . methylPREDNISolone (MEDROL) 4 MG tablet    Sig: Take 1 tablet (4 mg total) by mouth as directed.    Dispense:  7 tablet    Refill:  0    Order Specific Question:   Supervising Provider    Answer:   Donnamae Jude [[id number]]  . methylPREDNISolone (MEDROL) 8 MG tablet    Sig: Take 1 tablet (8 mg total) by mouth as directed.    Dispense:  13 tablet    Refill:  0    Order Specific Question:   Supervising Provider    Answer:   Merrily Pew   Results for orders placed or performed during the hospital encounter of 05/16/18 (from the past 24 hour(s))  Urinalysis, Routine w reflex microscopic     Status: Abnormal   Collection Time: 05/16/18  4:25 PM  Result Value Ref Range   Color, Urine YELLOW YELLOW   APPearance CLOUDY (A) CLEAR   Specific Gravity, Urine 1.028 1.005 - 1.030   pH 5.0 5.0 - 8.0   Glucose, UA NEGATIVE NEGATIVE mg/dL   Hgb urine dipstick NEGATIVE NEGATIVE   Bilirubin Urine NEGATIVE NEGATIVE   Ketones, ur 80 (A) NEGATIVE  mg/dL   Protein, ur 30 (A) NEGATIVE mg/dL   Nitrite NEGATIVE NEGATIVE   Leukocytes, UA SMALL (A) NEGATIVE   RBC / HPF 6-10 0 - 5 RBC/hpf   WBC, UA 11-20 0 - 5 WBC/hpf   Bacteria, UA NONE SEEN NONE SEEN   Squamous Epithelial / LPF 21-50 0 - 5   Mucus PRESENT     Assessment and Plan  --32 y.o. [id number] at [redacted]w[redacted]d --hyperemesis and dehydration in pregnancy --Reordered steroid taper from previous visit, discussed options with patient if pharmacy is out of supply --S/p 2L IV fluid including 1 vitamin bag+ D5LR and 1L D5LR --Per patient preference, continue current regimen of antiemetics --Reviewed general obstetric precautions including but not limited to falls, fever, vaginal bleeding, leaking of fluid,  headache not relieved by Tylenol, rest and PO hydration.  --Discharge home in stable condition  SDarlina Rumpf CNorth [place]7/09/2018, 7:14 PM

## 2018-05-16 NOTE — Discharge Instructions (Signed)

## 2018-05-16 NOTE — Progress Notes (Signed)
   PRENATAL VISIT NOTE  Subjective:  Samantha Guzman is a 32 y.o. married G5P2202 at 3913w4d being seen today for ongoing prenatal care.  She is currently monitored for the following issues for this high-risk pregnancy and has High-risk pregnancy; Hyperemesis gravidarum; Cervical incompetence, antepartum condition or complication; Previous cesarean section; Obesity complicating pregnancy; Depressive disorder, not elsewhere classified; Obesity in pregnancy; Hyperemesis gravidarum before end of [redacted] week gestation with electrolyte imbalance; Hypokalemia; and Cervical cerclage suture present on their problem list.  Patient reports vomiting.  Contractions: Not present. Vag. Bleeding: None.  Movement: Absent. Denies leaking of fluid.   The following portions of the patient's history were reviewed and updated as appropriate: allergies, current medications, past family history, past medical history, past social history, past surgical history and problem list. Problem list updated.  Objective:   Vitals:   05/16/18 1344  BP: (!) 136/93  Pulse: (!) 106  Weight: 224 lb (101.6 kg)    Fetal Status: Fetal Heart Rate (bpm): 146   Movement: Absent     General:  Alert, oriented and cooperative. Patient is in no acute distress.  Skin: Skin is warm and dry. No rash noted.   Cardiovascular: Normal heart rate noted  Respiratory: Normal respiratory effort, no problems with respiration noted  Abdomen: Soft, gravid, appropriate for gestational age.  Pain/Pressure: Absent     Pelvic: Cervical exam deferred        Extremities: Normal range of motion.  Edema: None  Mental Status: Normal mood and affect. Normal behavior. Normal judgment and thought content.   Assessment and Plan:  Pregnancy: [id number] at 7113w4d  1. Encounter for supervision of other normal pregnancy in second trimester  - Alpha fetoprotein, maternal  2. Hyperemesis- she has lost 8 pounds [facility]  She has 4+  ketones today. I have offered that the go to Urgent Care for IV fluids but [facility] feels bad [facility] Samantha Guzman and I will try to arrange home IV fluids for the future when she leaves the hospital.  Preterm labor symptoms and general obstetric precautions including but not limited to vaginal bleeding, contractions, leaking of fluid and fetal movement were reviewed in detail with the patient. Please refer to After Visit Summary for other counseling recommendations.  Return in about 1 month (around 06/13/2018).  Future Appointments  Date Time Provider Department Center  05/16/2018  2:00 PM Allie Bossierove, [place] C, MD [facility]  05/23/2018 10:15 AM [facility] US 4 [facility]    Allie BossierMyra C Mulan Adan, MD

## 2018-05-17 LAB — TIQ-AOE

## 2018-05-21 LAB — ALPHA FETOPROTEIN, MATERNAL
AFP MoM: 1.54
AFP, Serum: 48.3 ng/mL
Calc'd Gestational Age: 16.6 weeks
MATERNAL WT: 224 [lb_av]
Twins-AFP: 1

## 2018-05-23 ENCOUNTER — Ambulatory Visit (HOSPITAL_COMMUNITY): Payer: Medicaid Other

## 2018-06-03 ENCOUNTER — Other Ambulatory Visit: Payer: Self-pay | Admitting: Obstetrics & Gynecology

## 2018-06-03 ENCOUNTER — Ambulatory Visit (HOSPITAL_COMMUNITY)
Admission: RE | Admit: 2018-06-03 | Discharge: 2018-06-03 | Disposition: A | Discharge status: Home / Self Care | Payer: Medicaid Other | Source: Ambulatory Visit | Attending: Obstetrics & Gynecology | Admitting: Obstetrics & Gynecology

## 2018-06-03 ENCOUNTER — Other Ambulatory Visit (HOSPITAL_COMMUNITY): Payer: Self-pay | Admitting: *Deleted

## 2018-06-03 DIAGNOSIS — O0992 Supervision of high risk pregnancy, unspecified, second trimester: Secondary | ICD-10-CM | POA: Diagnosis present

## 2018-06-03 DIAGNOSIS — O09212 Supervision of pregnancy with history of pre-term labor, second trimester: Secondary | ICD-10-CM | POA: Diagnosis not present

## 2018-06-03 DIAGNOSIS — O3430 Maternal care for cervical incompetence, unspecified trimester: Secondary | ICD-10-CM

## 2018-06-03 DIAGNOSIS — Z363 Encounter for antenatal screening for malformations: Secondary | ICD-10-CM

## 2018-06-03 DIAGNOSIS — O3432 Maternal care for cervical incompetence, second trimester: Secondary | ICD-10-CM | POA: Diagnosis not present

## 2018-06-03 DIAGNOSIS — O09899 Supervision of other high risk pregnancies, unspecified trimester: Secondary | ICD-10-CM

## 2018-06-03 DIAGNOSIS — Z3A20 20 weeks gestation of pregnancy: Secondary | ICD-10-CM | POA: Diagnosis not present

## 2018-06-03 DIAGNOSIS — O09219 Supervision of pregnancy with history of pre-term labor, unspecified trimester: Secondary | ICD-10-CM

## 2018-06-03 DIAGNOSIS — O09293 Supervision of pregnancy with other poor reproductive or obstetric history, third trimester: Secondary | ICD-10-CM | POA: Diagnosis not present

## 2018-06-03 DIAGNOSIS — Z3686 Encounter for antenatal screening for cervical length: Secondary | ICD-10-CM | POA: Diagnosis not present

## 2018-06-19 ENCOUNTER — Encounter: Payer: Medicaid Other | Admitting: Obstetrics and Gynecology

## 2018-06-20 ENCOUNTER — Telehealth: Payer: Self-pay | Admitting: *Deleted

## 2018-06-20 ENCOUNTER — Encounter: Payer: Medicaid Other | Admitting: Obstetrics and Gynecology

## 2018-06-20 NOTE — Telephone Encounter (Signed)
Patient was scheduled for Monday 08/12 but patient called on Monday to cancel and stated she needed to come on a different day but needed to call back with work schedule.Marland Kitchen..Marland Kitchen

## 2018-06-23 ENCOUNTER — Inpatient Hospital Stay (HOSPITAL_COMMUNITY)
Admission: AD | Admit: 2018-06-23 | Discharge: 2018-06-23 | Disposition: A | Discharge status: Home / Self Care | Payer: Medicaid Other | Source: Ambulatory Visit | Attending: Obstetrics and Gynecology | Admitting: Obstetrics and Gynecology

## 2018-06-23 ENCOUNTER — Encounter (HOSPITAL_COMMUNITY): Payer: Self-pay | Admitting: *Deleted

## 2018-06-23 DIAGNOSIS — R42 Dizziness and giddiness: Secondary | ICD-10-CM | POA: Diagnosis present

## 2018-06-23 DIAGNOSIS — K920 Hematemesis: Secondary | ICD-10-CM | POA: Diagnosis not present

## 2018-06-23 DIAGNOSIS — Z3A22 22 weeks gestation of pregnancy: Secondary | ICD-10-CM | POA: Diagnosis not present

## 2018-06-23 DIAGNOSIS — O211 Hyperemesis gravidarum with metabolic disturbance: Secondary | ICD-10-CM | POA: Insufficient documentation

## 2018-06-23 DIAGNOSIS — R1110 Vomiting, unspecified: Secondary | ICD-10-CM

## 2018-06-23 LAB — COMPREHENSIVE METABOLIC PANEL
ALBUMIN: 3.7 g/dL (ref 3.5–5.0)
ALT: 17 U/L (ref 0–44)
AST: 21 U/L (ref 15–41)
Alkaline Phosphatase: 96 U/L (ref 38–126)
Anion gap: 14 (ref 5–15)
BILIRUBIN TOTAL: 0.2 mg/dL — AB (ref 0.3–1.2)
BUN: 6 mg/dL (ref 6–20)
CHLORIDE: 100 mmol/L (ref 98–111)
CO2: 22 mmol/L (ref 22–32)
Calcium: 9.2 mg/dL (ref 8.9–10.3)
Creatinine, Ser: 0.7 mg/dL (ref 0.44–1.00)
GFR calc Af Amer: >60 mL/min (ref >60)
GFR calc non Af Amer: >60 mL/min (ref >60)
GLUCOSE: 97 mg/dL (ref 70–99)
Potassium: 3.6 mmol/L (ref 3.5–5.1)
Sodium: 136 mmol/L (ref 135–145)
Total Protein: 7.5 g/dL (ref 6.5–8.1)

## 2018-06-23 LAB — CBC
HEMATOCRIT: 45.3 % (ref 36.0–46.0)
Hemoglobin: 15.6 g/dL — ABNORMAL HIGH (ref 12.0–15.0)
MCH: 27.9 pg (ref 26.0–34.0)
MCHC: 34.4 g/dL (ref 30.0–36.0)
MCV: 80.9 fL (ref 78.0–100.0)
Platelets: 189 10*3/uL (ref 150–400)
RBC: 5.6 MIL/uL — ABNORMAL HIGH (ref 3.87–5.11)
RDW: 14.2 % (ref 11.5–15.5)
WBC: 8.9 10*3/uL (ref 4.0–10.5)

## 2018-06-23 MED ORDER — PROMETHAZINE HCL 25 MG/ML IJ SOLN
25.0000 mg | Freq: Once | INTRAVENOUS | Status: AC
  Administered 2018-06-23: 25 mg via INTRAVENOUS
  Filled 2018-06-23: qty 1

## 2018-06-23 MED ORDER — PANTOPRAZOLE SODIUM 40 MG PO TBEC: 80.0000 mg | DELAYED_RELEASE_TABLET | Freq: Every day | ORAL | 0 refills | Status: DC

## 2018-06-23 MED ORDER — SUCRALFATE 1 G PO TABS: 1.0000 g | ORAL_TABLET | Freq: Three times a day (TID) | ORAL | 0 refills | Status: DC

## 2018-06-23 MED ORDER — SUCRALFATE 1 GM/10ML PO SUSP
1.0000 g | Freq: Once | ORAL | Status: AC
  Administered 2018-06-23: 1 g via ORAL
  Filled 2018-06-23: qty 10

## 2018-06-23 MED ORDER — LACTATED RINGERS IV BOLUS
1000.0000 mL | Freq: Once | INTRAVENOUS | Status: AC
  Administered 2018-06-23: 1000 mL via INTRAVENOUS

## 2018-06-23 MED ORDER — PANTOPRAZOLE SODIUM 40 MG PO TBEC
80.0000 mg | DELAYED_RELEASE_TABLET | Freq: Once | ORAL | Status: AC
  Administered 2018-06-23: 80 mg via ORAL
  Filled 2018-06-23: qty 2

## 2018-06-23 MED ORDER — SODIUM CHLORIDE 0.9 % IV SOLN
8.0000 mg | Freq: Once | INTRAVENOUS | Status: AC
  Administered 2018-06-23: 8 mg via INTRAVENOUS
  Filled 2018-06-23: qty 4

## 2018-06-23 NOTE — MAU Provider Note (Addendum)
Chief Complaint: Emesis; Fatigue; and Dizziness   First Provider Initiated Contact with Patient 06/23/18 1727     SUBJECTIVE HPI: Samantha Guzman is a 32 y.o. [id number] at 50w0dwho presents to Maternity Admissions reporting exacerbation of hyperemesis, epigastric burning and new-onset vomiting of blood. Sx had been adequately controlled w/ Phenergan suppositories, but it didn't help today. Last dose 8:00 am.   Location: epigastric Quality: burning Severity: 10/10 on pain scale Duration: few days Context: hyperemesis Timing: constant Modifying factors: no improvement w/ Pepcid Associated signs and symptoms: Pos for N/V, vomiting blood.   Past Medical History:  Diagnosis Date  . Anxiety   . Depression    brief hx, ok now  . GERD (gastroesophageal reflux disease)   . Hyperemesis   . Incompetent cervix in pregnancy   . Infection    UTI  . No significant past medical history   . Vertigo    OB History  Gravida Para Term Preterm AB Living  '5 4 2 2 ' 0 2  SAB TAB Ectopic Multiple Live Births  0       2    # Outcome Date GA Lbr Len/2nd Weight Sex Delivery Anes PTL Lv  5 Current           4 Term 02/02/14 [id number] g F CS-LTranv Spinal  LIV     Birth Comments: NBS---Normal HB FA Newborn Screen Barcode: [id number] Term 04/06/10 [id number] CS-LTranv  Y LIV  2 Preterm           1 Preterm            Past Surgical History:  Procedure Laterality Date  . CERVICAL CERCLAGE N/A 08/09/2013   Procedure: CERCLAGE CERVICAL;  Surgeon: ChaShelly BombardD;  Location[facility];  Service: Gynecology;  Laterality: N/A;  . CERVICAL CERCLAGE N/A 04/26/2018   Procedure: CERCLAGE CERVICAL;  Surgeon: PraDonnamae JudeD;  [facility];  Service: Gynecology;  Laterality: N/A;  . CESAREAN SECTION    . CESAREAN SECTION N/A 02/02/2014   Procedure: REPEAT CESAREAN SECTION;  Surgeon: ChaShelly BombardD;  [facility];  Service: Obstetrics;  Laterality: N/A;   Social History   Socioeconomic History   . Marital status: Single    Spouse name: Not on file  . Number of children: Not on file  . Years of education: Not on file  . Highest education level: Not on file  Occupational History  . Not on file  Social Needs  . Financial resource strain: Not on file  . Food insecurity:    Worry: Not on file    Inability: Not on file  . Transportation needs:    Medical: Not on file    Non-medical: Not on file  Tobacco Use  . Smoking status: Former Smoker    Types: Cigarettes    Last attempt to quit: 06/06/2009    Years since quitting: 9.0  . Smokeless tobacco: Never Used  Substance and Sexual Activity  . Alcohol use: No  . Drug use: No  . Sexual activity: Not Currently    Partners: Male  Lifestyle  . Physical activity:    Days per week: Not on file    Minutes per session: Not on file  . Stress: Not on file  Relationships  . Social connections:    Talks on phone: Not on file    Gets together: Not on file    Attends religious service: Not on file  Active member of club or organization: Not on file    Attends meetings of clubs or organizations: Not on file    Relationship status: Not on file  . Intimate partner violence:    Fear of current or ex partner: Not on file    Emotionally abused: Not on file    Physically abused: Not on file    Forced sexual activity: Not on file  Other Topics Concern  . Not on file  Social History Narrative  . Not on file   Family History  Problem Relation Age of Onset  . Stroke Father   . Heart disease Father    No current facility-administered medications on file prior to encounter.    Current Outpatient Medications on File Prior to Encounter  Medication Sig Dispense Refill  . Blood Glucose Monitoring Suppl (ACCU-CHEK NANO SMARTVIEW) w/Device KIT 1 kit by Subdermal route as directed. Check blood sugars when you first wake up before eating or drinking anything. (Patient not taking: Reported on 05/16/2018) 1 kit 0  . famotidine (PEPCID) 20 MG  tablet Take 20 mg by mouth 2 (two) times daily.    Marland Kitchen glucose blood (ACCU-CHEK SMARTVIEW) test strip Use as instructed to check blood sugars (Patient not taking: Reported on 05/16/2018) 100 each 12  . methylPREDNISolone (MEDROL) 16 MG tablet Take 1 tablet (16 mg total) by mouth as directed. 3 tablet 0  . methylPREDNISolone (MEDROL) 4 MG tablet Take 1 tablet (4 mg total) by mouth as directed. 7 tablet 0  . methylPREDNISolone (MEDROL) 8 MG tablet Take 1 tablet (8 mg total) by mouth as directed. 13 tablet 0  . ondansetron (ZOFRAN ODT) 4 MG disintegrating tablet Take 1 tablet (4 mg total) by mouth every 6 (six) hours as needed for nausea. 20 tablet 3  . promethazine (PHENERGAN) 25 MG suppository Place 1 suppository (25 mg total) rectally every 6 (six) hours as needed for nausea or vomiting. 30 each 1   Allergies  Allergen Reactions  . Tramadol     Tramadol caused itching.    I have reviewed patient's Past Medical Hx, Surgical Hx, Family Hx, Social Hx, medications and allergies.   Review of Systems  Constitutional: Negative for chills and fever.  HENT: Negative for nosebleeds.   Gastrointestinal: Positive for abdominal pain (epigastric), nausea and vomiting. Negative for blood in stool, constipation and diarrhea.  Genitourinary: Negative for vaginal bleeding.  Neurological: Negative for dizziness.    OBJECTIVE Patient Vitals for the past 24 hrs:  BP Temp Temp src Pulse Resp SpO2 Weight  06/23/18 1709 (!) 141/81 98.1 F (36.7 C) Oral 85 20 99 % -  06/23/18 1659 - - - - - - 102.1 kg   Constitutional: Well-developed, well-nourished female in mild distress. Actively vomiting Cardiovascular: normal rate Respiratory: normal rate and effort.  GI: Abd soft, non-tender, gravid appropriate for gestational age. Moderate amount of dark red blood in emesis, ~25% of emesis is blood. .  Neurologic: Alert and oriented x 4.  GU: Deferred  FHR 143 by doppler  LAB RESULTS Results for orders placed or  performed during the hospital encounter of 06/23/18 (from the past 24 hour(s))  Comprehensive metabolic panel     Status: Abnormal   Collection Time: 06/23/18  5:52 PM  Result Value Ref Range   Sodium 136 135 - 145 mmol/L   Potassium 3.6 3.5 - 5.1 mmol/L   Chloride 100 98 - 111 mmol/L   CO2 22 22 - 32 mmol/L   Glucose,  Bld 97 70 - 99 mg/dL   BUN 6 6 - 20 mg/dL   Creatinine, Ser 0.70 0.44 - 1.00 mg/dL   Calcium 9.2 8.9 - 10.3 mg/dL   Total Protein 7.5 6.5 - 8.1 g/dL   Albumin 3.7 3.5 - 5.0 g/dL   AST 21 15 - 41 U/L   ALT 17 0 - 44 U/L   Alkaline Phosphatase 96 38 - 126 U/L   Total Bilirubin 0.2 (L) 0.3 - 1.2 mg/dL   GFR calc non Af Amer >60 >60 mL/min   GFR calc Af Amer >60 >60 mL/min   Anion gap 14 5 - 15  CBC     Status: Abnormal   Collection Time: 06/23/18  5:52 PM  Result Value Ref Range   WBC 8.9 4.0 - 10.5 K/uL   RBC 5.60 (H) 3.87 - 5.11 MIL/uL   Hemoglobin 15.6 (H) 12.0 - 15.0 g/dL   HCT 45.3 36.0 - 46.0 %   MCV 80.9 78.0 - 100.0 fL   MCH 27.9 26.0 - 34.0 pg   MCHC 34.4 30.0 - 36.0 g/dL   RDW 14.2 11.5 - 15.5 %   Platelets 189 150 - 400 K/uL    MAU COURSE Orders Placed This Encounter  Procedures  . Comprehensive metabolic panel  . CBC  . Urinalysis, Routine w reflex microscopic   Meds ordered this encounter  Medications  . promethazine (PHENERGAN) 25 mg in dextrose 5% lactated ringers 1,000 mL infusion  . sucralfate (CARAFATE) 1 GM/10ML suspension 1 g  . pantoprazole (PROTONIX) EC tablet 80 mg  . ondansetron (ZOFRAN) 8 mg in sodium chloride 0.9 % 50 mL IVPB   Pt still very nauseous after Phenergen. Unable to take PO meds yet. Zofran ordered.   Care of pt turned over to Laury Deep, CNM at 2015.  Tamala Julian, [place], [place] 06/23/2018 8:23 PM  Assessment/Plan: Hyperemesis gravidarum before end of [redacted] week gestation with electrolyte imbalance - Rx for Protonix 80 mg daily sent  Hematemesis with nausea - Rx Carafate 1 g po TID & hs sent - Message sent to  [facility] to get patient outpatient G.I. Referral  - Discharge home - Patient verbalized an understanding of the plan of care and agrees.   Laury Deep, CNM  06/23/2018 9:00 PM

## 2018-06-23 NOTE — MAU Note (Signed)
Samantha NapoleonRenee Hibler is a 32 y.o. at 646w0d here in MAU reporting:  +emesis States has had hyperemesis the whole pregnancy States feels like the same thing Endorses noting blood in emesis Onset of complaint: got worse today Has taken her suppositories for nausea and a stomach acid pill  +abdominal and chest pain Burning in nature; reports associated with worsening of emesis Pain score: 10/10 Vitals:   06/23/18 1709  BP: (!) 141/81  Pulse: 85  Resp: 20  Temp: 98.1 F (36.7 C)  SpO2: 99%    FHT: 143 Lab orders placed from triage: ua

## 2018-06-24 ENCOUNTER — Telehealth: Payer: Self-pay | Admitting: *Deleted

## 2018-06-24 ENCOUNTER — Other Ambulatory Visit: Payer: Self-pay | Admitting: *Deleted

## 2018-06-24 ENCOUNTER — Encounter: Payer: Self-pay | Admitting: Physician Assistant

## 2018-06-24 DIAGNOSIS — K920 Hematemesis: Secondary | ICD-10-CM

## 2018-06-24 NOTE — Telephone Encounter (Signed)
-----   Message from [place], [place] sent at 06/23/2018  7:37 PM EDT ----- Regarding: GI referral  Pt needs GI referral for hematemesis.

## 2018-06-24 NOTE — Progress Notes (Signed)
Referral placed for [facility] for hematemesis per Ivonne AndrewV Smith, CNM

## 2018-06-27 ENCOUNTER — Other Ambulatory Visit: Payer: Self-pay

## 2018-06-27 DIAGNOSIS — R1110 Vomiting, unspecified: Secondary | ICD-10-CM

## 2018-06-27 MED ORDER — PROMETHAZINE HCL 25 MG RE SUPP: 25.0000 mg | Freq: Four times a day (QID) | RECTAL | 0 refills | Status: DC | PRN

## 2018-06-27 NOTE — Progress Notes (Signed)
Pt called and needs refill on her phenergan suppositories, rx sent to pharmacy, pt aware.

## 2018-07-01 ENCOUNTER — Other Ambulatory Visit (HOSPITAL_COMMUNITY): Payer: Self-pay | Admitting: *Deleted

## 2018-07-01 ENCOUNTER — Ambulatory Visit (HOSPITAL_COMMUNITY)
Admission: RE | Admit: 2018-07-01 | Discharge: 2018-07-01 | Disposition: A | Discharge status: Home / Self Care | Payer: Medicaid Other | Source: Ambulatory Visit | Attending: Obstetrics & Gynecology | Admitting: Obstetrics & Gynecology

## 2018-07-01 DIAGNOSIS — O3432 Maternal care for cervical incompetence, second trimester: Secondary | ICD-10-CM

## 2018-07-01 DIAGNOSIS — O09212 Supervision of pregnancy with history of pre-term labor, second trimester: Secondary | ICD-10-CM | POA: Diagnosis not present

## 2018-07-01 DIAGNOSIS — O09213 Supervision of pregnancy with history of pre-term labor, third trimester: Secondary | ICD-10-CM

## 2018-07-01 DIAGNOSIS — O3430 Maternal care for cervical incompetence, unspecified trimester: Secondary | ICD-10-CM

## 2018-07-01 DIAGNOSIS — O34219 Maternal care for unspecified type scar from previous cesarean delivery: Secondary | ICD-10-CM | POA: Diagnosis not present

## 2018-07-01 DIAGNOSIS — Z3A24 24 weeks gestation of pregnancy: Secondary | ICD-10-CM | POA: Diagnosis not present

## 2018-07-10 ENCOUNTER — Encounter: Payer: Self-pay | Admitting: Obstetrics & Gynecology

## 2018-07-10 ENCOUNTER — Ambulatory Visit (INDEPENDENT_AMBULATORY_CARE_PROVIDER_SITE_OTHER): Payer: Medicaid Other | Admitting: Obstetrics & Gynecology

## 2018-07-10 VITALS — BP 113/79 | HR 89 | Wt 250.7 lb

## 2018-07-10 DIAGNOSIS — O3432 Maternal care for cervical incompetence, second trimester: Secondary | ICD-10-CM

## 2018-07-10 DIAGNOSIS — O0992 Supervision of high risk pregnancy, unspecified, second trimester: Secondary | ICD-10-CM

## 2018-07-10 DIAGNOSIS — O3430 Maternal care for cervical incompetence, unspecified trimester: Secondary | ICD-10-CM

## 2018-07-10 NOTE — Progress Notes (Signed)
   PRENATAL VISIT NOTE  Subjective:  Samantha Guzman is a 32 y.o. [id number] at [redacted]w[redacted]d being seen today for ongoing prenatal care.  She is currently monitored for the following issues for this high-risk pregnancy and has High-risk pregnancy; Hyperemesis; Cervical incompetence, antepartum condition or complication; Previous cesarean section; Obesity complicating pregnancy; Depressive disorder, not elsewhere classified; Obesity in pregnancy; Hyperemesis gravidarum before end of [redacted] week gestation with electrolyte imbalance; Hypokalemia; Cervical cerclage suture present; and Bloody emesis on their problem list.  Patient reports nausea, vomiting and which is improved.  Contractions: Not present. Vag. Bleeding: None.  Movement: Present. Denies leaking of fluid.   The following portions of the patient's history were reviewed and updated as appropriate: allergies, current medications, past family history, past medical history, past social history, past surgical history and problem list. Problem list updated.  Objective:   Vitals:   07/10/18 1120  BP: 113/79  Pulse: 89  Weight: 250 lb 11.2 oz (113.7 kg)    Fetal Status: Fetal Heart Rate (bpm): 154   Movement: Present     General:  Alert, oriented and cooperative. Patient is in no acute distress.  Skin: Skin is warm and dry. No rash noted.   Cardiovascular: Normal heart rate noted  Respiratory: Normal respiratory effort, no problems with respiration noted  Abdomen: Soft, gravid, appropriate for gestational age.  Pain/Pressure: Present     Pelvic: Cervical exam deferred        Extremities: Normal range of motion.  Edema: Trace  Mental Status: Normal mood and affect. Normal behavior. Normal judgment and thought content.   Assessment and Plan:  Pregnancy: [id number] at [redacted]w[redacted]d cerclage   2. Cervical incompetence, antepartuGI for m condition or complication F/U US is scheduled  3. Cervical cerclage suture present in second trimester GI appt next  week  Preterm labor symptoms and general obstetric precautions including but not limited to vaginal bleeding, contractions, leaking of fluid and fetal movement were reviewed in detail with the patient. Please refer to After Visit Summary for other counseling recommendations.  Return in about 4 weeks (around 08/07/2018) for 2 hr GTT.  Future Appointments  Date Time Provider Department Center  07/16/2018 10:30 AM [facility]  08/26/2018  8:30 AM [facility] [place] [facility]    Scheryl Darter, MD

## 2018-07-10 NOTE — Progress Notes (Signed)
Pt admits she is not checking sugars.

## 2018-07-16 ENCOUNTER — Ambulatory Visit: Payer: Medicaid Other | Admitting: Physician Assistant

## 2018-07-26 ENCOUNTER — Other Ambulatory Visit: Payer: Self-pay

## 2018-07-26 ENCOUNTER — Telehealth: Payer: Self-pay

## 2018-07-26 DIAGNOSIS — R1110 Vomiting, unspecified: Secondary | ICD-10-CM

## 2018-07-26 MED ORDER — ONDANSETRON 4 MG PO TBDP: 4.0000 mg | ORAL_TABLET | Freq: Four times a day (QID) | ORAL | 1 refills | Status: DC | PRN

## 2018-07-26 NOTE — Progress Notes (Signed)
Consulted w/provider regarding refill request.

## 2018-07-26 NOTE — Telephone Encounter (Signed)
TC from pt requesting refill for zofran Rx Consulted w/ provider in the office.  Rx sent  Pt notified

## 2018-07-26 NOTE — Telephone Encounter (Signed)
Chart reviewed for phone call and medication refill. Agree with plan of care as stated in CMA notes.  Clayton BiblesSamantha Weinhold, CNM 07/26/18  10:59 AM

## 2018-07-31 ENCOUNTER — Other Ambulatory Visit: Payer: Self-pay | Admitting: Obstetrics and Gynecology

## 2018-08-07 ENCOUNTER — Other Ambulatory Visit: Payer: Medicaid Other

## 2018-08-07 ENCOUNTER — Ambulatory Visit (INDEPENDENT_AMBULATORY_CARE_PROVIDER_SITE_OTHER): Payer: Medicaid Other | Admitting: Obstetrics & Gynecology

## 2018-08-07 VITALS — BP 116/79 | HR 93 | Wt 248.0 lb

## 2018-08-07 DIAGNOSIS — O0992 Supervision of high risk pregnancy, unspecified, second trimester: Secondary | ICD-10-CM

## 2018-08-07 DIAGNOSIS — R1110 Vomiting, unspecified: Secondary | ICD-10-CM

## 2018-08-07 DIAGNOSIS — O3430 Maternal care for cervical incompetence, unspecified trimester: Secondary | ICD-10-CM

## 2018-08-07 MED ORDER — ONDANSETRON HCL 4 MG PO TABS: 4.0000 mg | ORAL_TABLET | Freq: Three times a day (TID) | ORAL | 2 refills | Status: DC | PRN

## 2018-08-07 NOTE — Patient Instructions (Signed)

## 2018-08-07 NOTE — Progress Notes (Signed)
   PRENATAL VISIT NOTE  Subjective:  Samantha Guzman is a 32 y.o. [id number] at [redacted]w[redacted]d being seen today for ongoing prenatal care.  She is currently monitored for the following issues for this low-risk pregnancy and has High-risk pregnancy; Hyperemesis; Cervical incompetence, antepartum condition or complication; Previous cesarean section; Obesity complicating pregnancy; Depressive disorder, not elsewhere classified; Obesity in pregnancy; Hyperemesis gravidarum before end of [redacted] week gestation with electrolyte imbalance; Hypokalemia; Cervical cerclage suture present; and Bloody emesis on their problem list.  Patient reports nausea.  Contractions: Not present. Vag. Bleeding: None.  Movement: Present. Denies leaking of fluid.   The following portions of the patient's history were reviewed and updated as appropriate: allergies, current medications, past family history, past medical history, past social history, past surgical history and problem list. Problem list updated.  Objective:   Vitals:   08/07/18 0907  BP: 116/79  Pulse: 93  Weight: 248 lb (112.5 kg)    Fetal Status: Fetal Heart Rate (bpm): 142   Movement: Present     General:  Alert, oriented and cooperative. Patient is in no acute distress.  Skin: Skin is warm and dry. No rash noted.   Cardiovascular: Normal heart rate noted  Respiratory: Normal respiratory effort, no problems with respiration noted  Abdomen: Soft, gravid, appropriate for gestational age.  Pain/Pressure: Present     Pelvic: Cervical exam deferred        Extremities: Normal range of motion.  Edema: None  Mental Status: Normal mood and affect. Normal behavior. Normal judgment and thought content.   Assessment and Plan:  Pregnancy: [id number] at [redacted]w[redacted]d  1. High-risk pregnancy in second trimester Routine 28 week labs - Glucose Tolerance, 2 Hours w/1 Hour - CBC - RPR - HIV Antibody (routine testing w rflx)  2. Cervical incompetence, antepartum condition or  complication cerclage  3. Hyperemesis Continue present medications - ondansetron (ZOFRAN) 4 MG tablet; Take 1 tablet (4 mg total) by mouth every 8 (eight) hours as needed for nausea or vomiting.  Dispense: 20 tablet; Refill: 2  Preterm labor symptoms and general obstetric precautions including but not limited to vaginal bleeding, contractions, leaking of fluid and fetal movement were reviewed in detail with the patient. Please refer to After Visit Summary for other counseling recommendations.  Return in about 2 weeks (around 08/21/2018).  Future Appointments  Date Time Provider Department Center  08/26/2018  8:30 AM [facility] [place] [facility]    Scheryl Darter, MD

## 2018-08-07 NOTE — Progress Notes (Signed)
Pt is here for ROB and GTT. G5P2 [redacted]w[redacted]d.

## 2018-08-08 LAB — CBC
HEMATOCRIT: 29.4 % — AB (ref 34.0–46.6)
Hemoglobin: 9.6 g/dL — ABNORMAL LOW (ref 11.1–15.9)
MCH: 26.6 pg (ref 26.6–33.0)
MCHC: 32.7 g/dL (ref 31.5–35.7)
MCV: 81 fL (ref 79–97)
PLATELETS: 275 10*3/uL (ref 150–450)
RBC: 3.61 x10E6/uL — ABNORMAL LOW (ref 3.77–5.28)
RDW: 12.9 % (ref 12.3–15.4)
WBC: 7.6 10*3/uL (ref 3.4–10.8)

## 2018-08-08 LAB — GLUCOSE TOLERANCE, 2 HOURS W/ 1HR
GLUCOSE, 1 HOUR: 105 mg/dL (ref 65–179)
GLUCOSE, FASTING: 79 mg/dL (ref 65–91)
Glucose, 2 hour: 99 mg/dL (ref 65–152)

## 2018-08-08 LAB — RPR: RPR Ser Ql: NONREACTIVE

## 2018-08-08 LAB — HIV ANTIBODY (ROUTINE TESTING W REFLEX): HIV Screen 4th Generation wRfx: NONREACTIVE

## 2018-08-16 ENCOUNTER — Other Ambulatory Visit: Payer: Self-pay

## 2018-08-16 DIAGNOSIS — R1110 Vomiting, unspecified: Secondary | ICD-10-CM

## 2018-08-16 MED ORDER — PANTOPRAZOLE SODIUM 40 MG PO TBEC: 80.0000 mg | DELAYED_RELEASE_TABLET | Freq: Every day | ORAL | 0 refills | Status: DC

## 2018-08-16 MED ORDER — PROMETHAZINE HCL 25 MG RE SUPP: 25.0000 mg | Freq: Four times a day (QID) | RECTAL | 0 refills | Status: DC | PRN

## 2018-08-21 ENCOUNTER — Ambulatory Visit (INDEPENDENT_AMBULATORY_CARE_PROVIDER_SITE_OTHER): Payer: Medicaid Other | Admitting: Certified Nurse Midwife

## 2018-08-21 ENCOUNTER — Other Ambulatory Visit: Payer: Self-pay

## 2018-08-21 VITALS — BP 128/66 | HR 80 | Wt 249.4 lb

## 2018-08-21 DIAGNOSIS — O99213 Obesity complicating pregnancy, third trimester: Secondary | ICD-10-CM

## 2018-08-21 DIAGNOSIS — O3430 Maternal care for cervical incompetence, unspecified trimester: Secondary | ICD-10-CM

## 2018-08-21 DIAGNOSIS — Z98891 History of uterine scar from previous surgery: Secondary | ICD-10-CM

## 2018-08-21 DIAGNOSIS — Z3481 Encounter for supervision of other normal pregnancy, first trimester: Secondary | ICD-10-CM

## 2018-08-21 DIAGNOSIS — O0993 Supervision of high risk pregnancy, unspecified, third trimester: Secondary | ICD-10-CM

## 2018-08-21 NOTE — Progress Notes (Signed)
   PRENATAL VISIT NOTE  Subjective:  Samantha Guzman is a 32 y.o. [id number] at [redacted]w[redacted]d being seen today for ongoing prenatal care.  She is currently monitored for the following issues for this high-risk pregnancy and has High-risk pregnancy; Hyperemesis; Cervical incompetence, antepartum condition or complication; Previous cesarean section; Obesity complicating pregnancy; Depressive disorder, not elsewhere classified; Obesity in pregnancy; Hyperemesis gravidarum before end of [redacted] week gestation with electrolyte imbalance; Hypokalemia; Cervical cerclage suture present; and Bloody emesis on their problem list.  Patient reports pelvic pressure.  Contractions: Not present. Vag. Bleeding: None.  Movement: Present. Denies leaking of fluid.   The following portions of the patient's history were reviewed and updated as appropriate: allergies, current medications, past family history, past medical history, past social history, past surgical history and problem list. Problem list updated.  Objective:   Vitals:   08/21/18 0921  BP: 128/66  Pulse: 80  Weight: 249 lb 6.4 oz (113.1 kg)    Fetal Status: Fetal Heart Rate (bpm): 144 Fundal Height: 33 cm Movement: Present     General:  Alert, oriented and cooperative. Patient is in no acute distress.  Skin: Skin is warm and dry. No rash noted.   Cardiovascular: Normal heart rate noted  Respiratory: Normal respiratory effort, no problems with respiration noted  Abdomen: Soft, gravid, appropriate for gestational age.  Pain/Pressure: Present     Pelvic: Cervical exam deferred        Extremities: Normal range of motion.  Edema: Trace  Mental Status: Normal mood and affect. Normal behavior. Normal judgment and thought content.   Assessment and Plan:  Pregnancy: [id number] at [redacted]w[redacted]d  1. High-risk pregnancy in third trimester - Patient doing well, complains of pelvic pressure that is intermittent. Reports this occurred with last pregnancy as well.  - She reports  taking herself out of work since June due to pressure and this pregnancy being high risk. Educated on continuing pelvic rest but she does not need to be on bed rest. Patient verbalizes understanding.  - Reviewed labs results from last prenatal appointment  - Anticipatory guidance on upcoming appointments   2. Obesity affecting pregnancy in third trimester -BMI 39.7  3. Cervical incompetence, antepartum condition or complication - Cerclage in place  - Denies abdominal contractions, cramping, or vaginal bleeding   4. Previous cesarean section - C/S x2, plans for repeat   Preterm labor symptoms and general obstetric precautions including but not limited to vaginal bleeding, contractions, leaking of fluid and fetal movement were reviewed in detail with the patient. Please refer to After Visit Summary for other counseling recommendations.  Return in about 2 weeks (around 09/04/2018) for ROB.  Future Appointments  Date Time Provider Department Center  08/26/2018  8:30 AM [facility] [place] [facility]  09/05/2018  9:45 AM Conan Bowens, MD [facility] None    Sharyon Cable, CNM

## 2018-08-26 ENCOUNTER — Ambulatory Visit (HOSPITAL_COMMUNITY): Payer: Medicaid Other | Attending: Obstetrics & Gynecology

## 2018-08-26 ENCOUNTER — Encounter (HOSPITAL_COMMUNITY): Payer: Self-pay

## 2018-08-30 ENCOUNTER — Ambulatory Visit (HOSPITAL_COMMUNITY)
Admission: RE | Admit: 2018-08-30 | Discharge: 2018-08-30 | Disposition: A | Discharge status: Home / Self Care | Payer: Medicaid Other | Source: Ambulatory Visit | Attending: Obstetrics & Gynecology | Admitting: Obstetrics & Gynecology

## 2018-08-30 DIAGNOSIS — O3432 Maternal care for cervical incompetence, second trimester: Secondary | ICD-10-CM | POA: Diagnosis not present

## 2018-08-30 DIAGNOSIS — Z3A32 32 weeks gestation of pregnancy: Secondary | ICD-10-CM | POA: Diagnosis not present

## 2018-08-30 DIAGNOSIS — O99213 Obesity complicating pregnancy, third trimester: Secondary | ICD-10-CM

## 2018-08-30 DIAGNOSIS — O09213 Supervision of pregnancy with history of pre-term labor, third trimester: Secondary | ICD-10-CM | POA: Diagnosis present

## 2018-09-05 ENCOUNTER — Ambulatory Visit (INDEPENDENT_AMBULATORY_CARE_PROVIDER_SITE_OTHER): Payer: Medicaid Other | Admitting: Obstetrics and Gynecology

## 2018-09-05 ENCOUNTER — Encounter: Payer: Self-pay | Admitting: Obstetrics and Gynecology

## 2018-09-05 ENCOUNTER — Other Ambulatory Visit: Payer: Self-pay

## 2018-09-05 ENCOUNTER — Encounter (HOSPITAL_COMMUNITY): Payer: Self-pay

## 2018-09-05 ENCOUNTER — Other Ambulatory Visit: Payer: Self-pay | Admitting: Family Medicine

## 2018-09-05 VITALS — BP 118/76 | HR 92 | Wt 256.0 lb

## 2018-09-05 DIAGNOSIS — Z98891 History of uterine scar from previous surgery: Secondary | ICD-10-CM

## 2018-09-05 DIAGNOSIS — O0993 Supervision of high risk pregnancy, unspecified, third trimester: Secondary | ICD-10-CM

## 2018-09-05 DIAGNOSIS — O3433 Maternal care for cervical incompetence, third trimester: Secondary | ICD-10-CM

## 2018-09-05 NOTE — Patient Instructions (Signed)
Contraception Choices Contraception, also called birth control, refers to methods or devices that prevent pregnancy. Hormonal methods Contraceptive implant A contraceptive implant is a thin, plastic tube that contains a hormone. It is inserted into the upper part of the arm. It can remain in place for up to 3 years. Progestin-only injections Progestin-only injections are injections of progestin, a synthetic form of the hormone progesterone. They are given every 3 months by a health care provider. Birth control pills Birth control pills are pills that contain hormones that prevent pregnancy. They must be taken once a day, preferably at the same time each day. Birth control patch The birth control patch contains hormones that prevent pregnancy. It is placed on the skin and must be changed once a week for three weeks and removed on the fourth week. A prescription is needed to use this method of contraception. Vaginal ring A vaginal ring contains hormones that prevent pregnancy. It is placed in the vagina for three weeks and removed on the fourth week. After that, the process is repeated with a new ring. A prescription is needed to use this method of contraception. Emergency contraceptive Emergency contraceptives prevent pregnancy after unprotected sex. They come in pill form and can be taken up to 5 days after sex. They work best the sooner they are taken after having sex. Most emergency contraceptives are available without a prescription. This method should not be used as your only form of birth control. Barrier methods Female condom A female condom is a thin sheath that is worn over the penis during sex. Condoms keep sperm from going inside a woman's body. They can be used with a spermicide to increase their effectiveness. They should be disposed after a single use. Female condom A female condom is a soft, loose-fitting sheath that is put into the vagina before sex. The condom keeps sperm from going  inside a woman's body. They should be disposed after a single use.  Intrauterine contraception Intrauterine device (IUD) An IUD is a T-shaped device that is put in a woman's uterus. There are two types:  Hormone IUD.This type contains progestin, a synthetic form of the hormone progesterone. This type can stay in place for 3-5 years.  Copper IUD.This type is wrapped in copper wire. It can stay in place for 10 years.  Permanent methods of contraception Female tubal ligation In this method, a woman's fallopian tubes are sealed, tied, or blocked during surgery to prevent eggs from traveling to the uterus.  Female sterilization This is a procedure to tie off the tubes that carry sperm (vasectomy). After the procedure, the man can still ejaculate fluid (semen).  Summary  Contraception, also called birth control, means methods or devices that prevent pregnancy.  Hormonal methods of contraception include implants, injections, pills, patches, vaginal rings, and emergency contraceptives.  Barrier methods of contraception can include female condoms, female condoms, diaphragms, cervical caps, sponges, and spermicides.  There are two types of IUDs (intrauterine devices). An IUD can be put in a woman's uterus to prevent pregnancy for 3-5 years.  Permanent sterilization can be done through a procedure for males, females, or both. This information is not intended to replace advice given to you by your health care provider. Make sure you discuss any questions you have with your health care provider. Document Released: 10/23/2005 Document Revised: 11/25/2016 Document Reviewed: 11/25/2016 Elsevier Interactive Patient Education  2018 Elsevier Inc.  Etonogestrel implant What is this medicine? ETONOGESTREL (et oh noe JES trel) is a contraceptive (birth   control) device. It is used to prevent pregnancy. It can be used for up to 3 years. This medicine may be used for other purposes; ask your health care  provider or pharmacist if you have questions. COMMON BRAND NAME(S): Implanon, Nexplanon What should I tell my health care provider before I take this medicine? They need to know if you have any of these conditions: -abnormal vaginal bleeding -blood vessel disease or blood clots -cancer of the breast, cervix, or liver -depression -diabetes -gallbladder disease -headaches -heart disease or recent heart attack -high blood pressure -high cholesterol -kidney disease -liver disease -renal disease -seizures -tobacco smoker -an unusual or allergic reaction to etonogestrel, other hormones, anesthetics or antiseptics, medicines, foods, dyes, or preservatives -pregnant or trying to get pregnant -breast-feeding How should I use this medicine? This device is inserted just under the skin on the inner side of your upper arm by a health care professional. Talk to your pediatrician regarding the use of this medicine in children. Special care may be needed. Overdosage: If you think you have taken too much of this medicine contact a poison control center or emergency room at once. NOTE: This medicine is only for you. Do not share this medicine with others. What if I miss a dose? This does not apply. What may interact with this medicine? Do not take this medicine with any of the following medications: -amprenavir -bosentan -fosamprenavir This medicine may also interact with the following medications: -barbiturate medicines for inducing sleep or treating seizures -certain medicines for fungal infections like ketoconazole and itraconazole -grapefruit juice -griseofulvin -medicines to treat seizures like carbamazepine, felbamate, oxcarbazepine, phenytoin, topiramate -modafinil -phenylbutazone -rifampin -rufinamide -some medicines to treat HIV infection like atazanavir, indinavir, lopinavir, nelfinavir, tipranavir, ritonavir -St. John's wort This list may not describe all possible interactions.  Give your health care provider a list of all the medicines, herbs, non-prescription drugs, or dietary supplements you use. Also tell them if you smoke, drink alcohol, or use illegal drugs. Some items may interact with your medicine. What should I watch for while using this medicine? This product does not protect you against HIV infection (AIDS) or other sexually transmitted diseases. You should be able to feel the implant by pressing your fingertips over the skin where it was inserted. Contact your doctor if you cannot feel the implant, and use a non-hormonal birth control method (such as condoms) until your doctor confirms that the implant is in place. If you feel that the implant may have broken or become bent while in your arm, contact your healthcare provider. What side effects may I notice from receiving this medicine? Side effects that you should report to your doctor or health care professional as soon as possible: -allergic reactions like skin rash, itching or hives, swelling of the face, lips, or tongue -breast lumps -changes in emotions or moods -depressed mood -heavy or prolonged menstrual bleeding -pain, irritation, swelling, or bruising at the insertion site -scar at site of insertion -signs of infection at the insertion site such as fever, and skin redness, pain or discharge -signs of pregnancy -signs and symptoms of a blood clot such as breathing problems; changes in vision; chest pain; severe, sudden headache; pain, swelling, warmth in the leg; trouble speaking; sudden numbness or weakness of the face, arm or leg -signs and symptoms of liver injury like dark yellow or brown urine; general ill feeling or flu-like symptoms; light-colored stools; loss of appetite; nausea; right upper belly pain; unusually weak or tired; yellowing of the   eyes or skin -unusual vaginal bleeding, discharge -signs and symptoms of a stroke like changes in vision; confusion; trouble speaking or understanding;  severe headaches; sudden numbness or weakness of the face, arm or leg; trouble walking; dizziness; loss of balance or coordination Side effects that usually do not require medical attention (report to your doctor or health care professional if they continue or are bothersome): -acne -back pain -breast pain -changes in weight -dizziness -general ill feeling or flu-like symptoms -headache -irregular menstrual bleeding -nausea -sore throat -vaginal irritation or inflammation This list may not describe all possible side effects. Call your doctor for medical advice about side effects. You may report side effects to FDA at 1-800-FDA-1088. Where should I keep my medicine? This drug is given in a hospital or clinic and will not be stored at home. NOTE: This sheet is a summary. It may not cover all possible information. If you have questions about this medicine, talk to your doctor, pharmacist, or health care provider.  2018 Elsevier/Gold Standard (2016-05-11 11:19:22) Intrauterine Device Information An intrauterine device (IUD) is inserted into your uterus to prevent pregnancy. There are two types of IUDs available:  Copper IUD-This type of IUD is wrapped in copper wire and is placed inside the uterus. Copper makes the uterus and fallopian tubes produce a fluid that kills sperm. The copper IUD can stay in place for 10 years.  Hormone IUD-This type of IUD contains the hormone progestin (synthetic progesterone). The hormone thickens the cervical mucus and prevents sperm from entering the uterus. It also thins the uterine lining to prevent implantation of a fertilized egg. The hormone can weaken or kill the sperm that get into the uterus. One type of hormone IUD can stay in place for 5 years, and another type can stay in place for 3 years.  Your health care provider will make sure you are a good candidate for a contraceptive IUD. Discuss with your health care provider the possible side  effects. Advantages of an intrauterine device  IUDs are highly effective, reversible, long acting, and low maintenance.  There are no estrogen-related side effects.  An IUD can be used when breastfeeding.  IUDs are not associated with weight gain.  The copper IUD works immediately after insertion.  The hormone IUD works right away if inserted within 7 days of your period starting. You will need to use a backup method of birth control for 7 days if the hormone IUD is inserted at any other time in your cycle.  The copper IUD does not interfere with your female hormones.  The hormone IUD can make heavy menstrual periods lighter and decrease cramping.  The hormone IUD can be used for 3 or 5 years.  The copper IUD can be used for 10 years. Disadvantages of an intrauterine device  The hormone IUD can be associated with irregular bleeding patterns.  The copper IUD can make your menstrual flow heavier and more painful.  You may experience cramping and vaginal bleeding after insertion. This information is not intended to replace advice given to you by your health care provider. Make sure you discuss any questions you have with your health care provider. Document Released: 09/26/2004 Document Revised: 03/30/2016 Document Reviewed: 04/13/2013 Elsevier Interactive Patient Education  2017 Elsevier Inc.  

## 2018-09-05 NOTE — Progress Notes (Signed)
   PRENATAL VISIT NOTE  Subjective:  Samantha Guzman is a 32 y.o. [id number] at [redacted]w[redacted]d being seen today for ongoing prenatal care.  She is currently monitored for the following issues for this high-risk pregnancy and has High-risk pregnancy; Hyperemesis; Cervical incompetence, antepartum condition or complication; Previous cesarean section; Obesity complicating pregnancy; Depressive disorder, not elsewhere classified; Obesity in pregnancy; Hyperemesis gravidarum before end of [redacted] week gestation with electrolyte imbalance; Hypokalemia; Cervical cerclage suture present; and Bloody emesis on their problem list.  Patient reports some occasional uncomfortable contractions, slightly increased discharged.  Contractions: Not present. Vag. Bleeding: None.  Movement: Present. Denies leaking of fluid.   The following portions of the patient's history were reviewed and updated as appropriate: allergies, current medications, past family history, past medical history, past social history, past surgical history and problem list. Problem list updated.  Objective:   Vitals:   09/05/18 0942  BP: 118/76  Pulse: 92  Weight: 256 lb (116.1 kg)    Fetal Status: Fetal Heart Rate (bpm): 134   Movement: Present     General:  Alert, oriented and cooperative. Patient is in no acute distress.  Skin: Skin is warm and dry. No rash noted.   Cardiovascular: Normal heart rate noted  Respiratory: Normal respiratory effort, no problems with respiration noted  Abdomen: Soft, gravid, appropriate for gestational age.  Pain/Pressure: Present     Pelvic: Cervical exam deferred        Extremities: Normal range of motion.  Edema: None  Mental Status: Normal mood and affect. Normal behavior. Normal judgment and thought content.   Assessment and Plan:  Pregnancy: [id number] at [redacted]w[redacted]d  1. Cervical cerclage suture present in third trimester To be removed 36-37 weeks  2. High-risk pregnancy in third trimester S/p 2 x SVD @ 20  months  3. Previous cesarean section Reviewed risks/benefits of TOLAC versus RCS in detail. Patient counseled regarding potential vaginal delivery, chance of success, future implications, possible uterine rupture and need for urgent/emergent repeat cesarean. Counseled regarding potential need for repeat c-section for reasons unrelated to first c-section. Counseled regarding scheduled repeat cesarean including risks of bleeding, infection, damage to surrounding tissue, abnormal placentation, implications for future pregnancies. Counseled that there is limited data regarding uterine rupture after 2 CS, but will allow her to labor if she had spontaneous labor, we will not induce labor. All questions answered.  Patient desires TOLAC if goes into labor prior to 39 weeks, RCS at 39 weeks if no spontaneous labor, consent signed 09/05/2018. RCS scheduled for 39 weeks.  Preterm labor symptoms and general obstetric precautions including but not limited to vaginal bleeding, contractions, leaking of fluid and fetal movement were reviewed in detail with the patient. Please refer to After Visit Summary for other counseling recommendations.  Return in about 2 weeks (around 09/19/2018) for OB visit (MD).  Future Appointments  Date Time Provider Department Center  09/19/2018  3:45 PM Conan Bowens, MD [facility] None    Conan Bowens, MD

## 2018-09-19 ENCOUNTER — Encounter: Payer: Self-pay | Admitting: Obstetrics and Gynecology

## 2018-09-19 ENCOUNTER — Ambulatory Visit (INDEPENDENT_AMBULATORY_CARE_PROVIDER_SITE_OTHER): Payer: Medicaid Other | Admitting: Obstetrics and Gynecology

## 2018-09-19 VITALS — BP 109/74 | HR 75 | Wt 253.0 lb

## 2018-09-19 DIAGNOSIS — Z98891 History of uterine scar from previous surgery: Secondary | ICD-10-CM

## 2018-09-19 DIAGNOSIS — A6000 Herpesviral infection of urogenital system, unspecified: Secondary | ICD-10-CM | POA: Insufficient documentation

## 2018-09-19 DIAGNOSIS — O3433 Maternal care for cervical incompetence, third trimester: Secondary | ICD-10-CM

## 2018-09-19 DIAGNOSIS — O3430 Maternal care for cervical incompetence, unspecified trimester: Secondary | ICD-10-CM

## 2018-09-19 DIAGNOSIS — O0993 Supervision of high risk pregnancy, unspecified, third trimester: Secondary | ICD-10-CM

## 2018-09-19 HISTORY — DX: Herpesviral infection of urogenital system, unspecified: A60.00

## 2018-09-19 MED ORDER — VALACYCLOVIR HCL 500 MG PO TABS: 500.0000 mg | ORAL_TABLET | Freq: Two times a day (BID) | ORAL | 6 refills | Status: DC

## 2018-09-19 NOTE — Progress Notes (Signed)
   PRENATAL VISIT NOTE  Subjective:  Samantha Guzman is a 32 y.o. [id number] at 773w4d being seen today for ongoing prenatal care.  She is currently monitored for the following issues for this high-risk pregnancy and has High-risk pregnancy; Hyperemesis; Cervical incompetence, antepartum condition or complication; Previous cesarean section; Obesity complicating pregnancy; Depressive disorder, not elsewhere classified; Obesity in pregnancy; Hyperemesis gravidarum before end of [redacted] week gestation with electrolyte imbalance; Hypokalemia; Cervical cerclage suture present; Bloody emesis; and Herpes genitalia on their problem list.  Patient reports thinks she lost her mucous plug.  Contractions: Irregular. Vag. Bleeding: None.  Movement: Present. Denies leaking of fluid.   The following portions of the patient's history were reviewed and updated as appropriate: allergies, current medications, past family history, past medical history, past social history, past surgical history and problem list. Problem list updated.  Objective:   Vitals:   09/19/18 1553  BP: 109/74  Pulse: 75  Weight: 253 lb (114.8 kg)    Fetal Status:     Movement: Present     General:  Alert, oriented and cooperative. Patient is in no acute distress.  Skin: Skin is warm and dry. No rash noted.   Cardiovascular: Normal heart rate noted  Respiratory: Normal respiratory effort, no problems with respiration noted  Abdomen: Soft, gravid, appropriate for gestational age.  Pain/Pressure: Present     Pelvic: Cervical exam deferred        Extremities: Normal range of motion.     Mental Status: Normal mood and affect. Normal behavior. Normal judgment and thought content.   Assessment and Plan:  Pregnancy: [id number] at 763w4d  1. High-risk pregnancy in third trimester Patient confused about due date, reviewed chart and [facility] 5[facility] of 10/27/18, > 7 days from EDD based on LMP of 10/19/18. Will keep EDD 10/27/18.   2.  Previous cesarean section TOLAC desired if she goes into labor  3. Cervical incompetence, antepartum condition or complication Cerclage in place  4. Cervical cerclage suture present in third trimester Removal of cerclage 36 weeks  5. Herpes simplex infection of genitourinary system Will start valtrex ppx  Preterm labor symptoms and general obstetric precautions including but not limited to vaginal bleeding, contractions, leaking of fluid and fetal movement were reviewed in detail with the patient. Please refer to After Visit Summary for other counseling recommendations.  Return in about 1 week (around 09/26/2018) for OB visit (MD).  No future appointments.  Conan BowensKelly M Davis, MD

## 2018-09-20 ENCOUNTER — Other Ambulatory Visit: Payer: Self-pay | Admitting: Obstetrics & Gynecology

## 2018-09-20 DIAGNOSIS — R1110 Vomiting, unspecified: Secondary | ICD-10-CM

## 2018-09-26 ENCOUNTER — Encounter: Payer: Medicaid Other | Admitting: Family Medicine

## 2018-09-30 ENCOUNTER — Encounter: Payer: Self-pay | Admitting: Obstetrics and Gynecology

## 2018-09-30 ENCOUNTER — Ambulatory Visit (INDEPENDENT_AMBULATORY_CARE_PROVIDER_SITE_OTHER): Payer: Medicaid Other | Admitting: Obstetrics and Gynecology

## 2018-09-30 ENCOUNTER — Other Ambulatory Visit (HOSPITAL_COMMUNITY)
Admission: RE | Admit: 2018-09-30 | Discharge: 2018-09-30 | Disposition: A | Discharge status: Home / Self Care | Payer: Medicaid Other | Source: Ambulatory Visit | Attending: Obstetrics and Gynecology | Admitting: Obstetrics and Gynecology

## 2018-09-30 VITALS — BP 123/78 | HR 90 | Wt 240.0 lb

## 2018-09-30 DIAGNOSIS — O3430 Maternal care for cervical incompetence, unspecified trimester: Secondary | ICD-10-CM

## 2018-09-30 DIAGNOSIS — O99213 Obesity complicating pregnancy, third trimester: Secondary | ICD-10-CM

## 2018-09-30 DIAGNOSIS — O0993 Supervision of high risk pregnancy, unspecified, third trimester: Secondary | ICD-10-CM

## 2018-09-30 DIAGNOSIS — O3433 Maternal care for cervical incompetence, third trimester: Secondary | ICD-10-CM

## 2018-09-30 DIAGNOSIS — Z3A36 36 weeks gestation of pregnancy: Secondary | ICD-10-CM

## 2018-09-30 DIAGNOSIS — O99210 Obesity complicating pregnancy, unspecified trimester: Secondary | ICD-10-CM

## 2018-09-30 DIAGNOSIS — A6000 Herpesviral infection of urogenital system, unspecified: Secondary | ICD-10-CM

## 2018-09-30 NOTE — Progress Notes (Signed)
   PRENATAL VISIT NOTE  Subjective:  Samantha NapoleonRenee Covault is a 32 y.o. [id number] at 2942w1d being seen today for ongoing prenatal care.  She is currently monitored for the following issues for this high-risk pregnancy and has High-risk pregnancy; Hyperemesis; Cervical incompetence, antepartum condition or complication; Previous cesarean section; Obesity complicating pregnancy; Depressive disorder, not elsewhere classified; Obesity in pregnancy; Hyperemesis gravidarum before end of [redacted] week gestation with electrolyte imbalance; Hypokalemia; Cervical cerclage suture present; Bloody emesis; and Herpes genitalia on their problem list.  Patient reports no complaints.  Contractions: Irregular. Vag. Bleeding: None.  Movement: Present. Denies leaking of fluid.   The following portions of the patient's history were reviewed and updated as appropriate: allergies, current medications, past family history, past medical history, past social history, past surgical history and problem list. Problem list updated.  Objective:   Vitals:   09/30/18 1440  BP: 123/78  Pulse: 90  Weight: 240 lb (108.9 kg)    Fetal Status: Fetal Heart Rate (bpm): 148 Fundal Height: 36 cm Movement: Present     General:  Alert, oriented and cooperative. Patient is in no acute distress.  Skin: Skin is warm and dry. No rash noted.   Cardiovascular: Normal heart rate noted  Respiratory: Normal respiratory effort, no problems with respiration noted  Abdomen: Soft, gravid, appropriate for gestational age.  Pain/Pressure: Present     Pelvic: Cervical exam performed Dilation: Closed Effacement (%): Thick Station: Ballotable  Extremities: Normal range of motion.  Edema: None  Mental Status: Normal mood and affect. Normal behavior. Normal judgment and thought content.   Assessment and Plan:  Pregnancy: [id number] at 3342w1d  1. High-risk pregnancy in third trimester Patient is doing well  Cultures collected Patient with 2 previous c-sections  scheduled for repeat. Patient would like to TOLAC if spontaneous onset of labor - Strep Gp B NAA - GC/Chlamydia probe amp ([place]  2. Cervical incompetence, antepartum condition or complication Cerclage removed today  3. Obesity in pregnancy   4. Herpes simplex infection of genitourinary system Continue prophylaxis with valtrex  Preterm labor symptoms and general obstetric precautions including but not limited to vaginal bleeding, contractions, leaking of fluid and fetal movement were reviewed in detail with the patient. Please refer to After Visit Summary for other counseling recommendations.  Return in about 1 week (around 10/07/2018) for ROB.  No future appointments.  Catalina AntiguaPeggy Elisa Kutner, MD

## 2018-10-01 LAB — GC/CHLAMYDIA PROBE AMP (~~LOC~~) NOT AT ARMC
CHLAMYDIA, DNA PROBE: NEGATIVE
NEISSERIA GONORRHEA: NEGATIVE

## 2018-10-02 LAB — STREP GP B NAA: Strep Gp B NAA: NEGATIVE

## 2018-10-08 ENCOUNTER — Telehealth: Payer: Self-pay

## 2018-10-08 NOTE — Telephone Encounter (Signed)
Returned call and advised pt that induction date cannot be moved to this week because of N&V, pt stated that she understood and will continue to take rx for nausea.  Advised pt that if symptoms get worse, go to MAU for possible fluids and evaluation.

## 2018-10-10 ENCOUNTER — Telehealth (HOSPITAL_COMMUNITY): Payer: Self-pay | Admitting: *Deleted

## 2018-10-10 ENCOUNTER — Encounter: Payer: Self-pay | Admitting: Certified Nurse Midwife

## 2018-10-10 ENCOUNTER — Ambulatory Visit (INDEPENDENT_AMBULATORY_CARE_PROVIDER_SITE_OTHER): Payer: Medicaid Other | Admitting: Certified Nurse Midwife

## 2018-10-10 VITALS — BP 136/83 | HR 105 | Wt 259.4 lb

## 2018-10-10 DIAGNOSIS — O0993 Supervision of high risk pregnancy, unspecified, third trimester: Secondary | ICD-10-CM

## 2018-10-10 DIAGNOSIS — Z98891 History of uterine scar from previous surgery: Secondary | ICD-10-CM

## 2018-10-10 DIAGNOSIS — Z3A37 37 weeks gestation of pregnancy: Secondary | ICD-10-CM

## 2018-10-10 DIAGNOSIS — A6000 Herpesviral infection of urogenital system, unspecified: Secondary | ICD-10-CM

## 2018-10-10 NOTE — Patient Instructions (Signed)
Reasons to go to MAU:  1.  Contractions are  4-5 minutes apart or less, each last 1 minute, these have been going on for 1-2 hours, and you cannot walk or talk during them 2.  You have a large gush of fluid, or a trickle of fluid that will not stop and you have to wear a pad 3.  You have bleeding that is bright red, heavier than spotting--like menstrual bleeding (spotting can be normal in early labor or after a check of your cervix) 4.  You do not feel the baby moving like he/she normally does  

## 2018-10-10 NOTE — Progress Notes (Signed)
Pt presents for ROB.Pt has no concerns. 

## 2018-10-10 NOTE — Progress Notes (Signed)
   PRENATAL VISIT NOTE  Subjective:  Samantha Guzman is a 32 y.o. [id number] at 3182w4d being seen today for ongoing prenatal care.  She is currently monitored for the following issues for this high-risk pregnancy and has High-risk pregnancy; Hyperemesis; Cervical incompetence, antepartum condition or complication; Previous cesarean section; Obesity complicating pregnancy; Depressive disorder, not elsewhere classified; Obesity in pregnancy; Hyperemesis gravidarum before end of [redacted] week gestation with electrolyte imbalance; Hypokalemia; Cervical cerclage suture present; Bloody emesis; and Herpes genitalia on their problem list.  Patient reports occasional contractions.  Contractions: Irregular. Vag. Bleeding: None.  Movement: Present. Denies leaking of fluid.   The following portions of the patient's history were reviewed and updated as appropriate: allergies, current medications, past family history, past medical history, past social history, past surgical history and problem list. Problem list updated.  Objective:   Vitals:   10/10/18 1605  BP: 136/83  Pulse: (!) 105  Weight: 259 lb 6.4 oz (117.7 kg)    Fetal Status: Fetal Heart Rate (bpm): 137 Fundal Height: 37 cm Movement: Present     General:  Alert, oriented and cooperative. Patient is in no acute distress.  Skin: Skin is warm and dry. No rash noted.   Cardiovascular: Normal heart rate noted  Respiratory: Normal respiratory effort, no problems with respiration noted  Abdomen: Soft, gravid, appropriate for gestational age.  Pain/Pressure: Present     Pelvic: Cervical exam deferred        Extremities: Normal range of motion.  Edema: None  Mental Status: Normal mood and affect. Normal behavior. Normal judgment and thought content.   Assessment and Plan:  Pregnancy: [id number] at 6882w4d  1. High-risk pregnancy in third trimester - Patient doing well, reports occasional contractions that are mild  - Anticipatory guidance on upcoming  appointments  - Labor precautions discussed   2. Previous cesarean section - TOLAC if SOL prior to 39 weeks, RCS @ 39 weeks scheduled   3. Herpes simplex infection of genitourinary system - Currently on Valtrex for suppression   Term labor symptoms and general obstetric precautions including but not limited to vaginal bleeding, contractions, leaking of fluid and fetal movement were reviewed in detail with the patient. Please refer to After Visit Summary for other counseling recommendations.  Return in about 1 week (around 10/17/2018) for ROB.  Future Appointments  Date Time Provider Department Center  10/15/2018  4:15 PM Constant, Gigi GinPeggy, MD [facility] None  10/21/2018  9:15 AM [facility] PAT 5 [facility] None    Sharyon CableVeronica C Daquon Greenleaf, CNM

## 2018-10-10 NOTE — Telephone Encounter (Signed)
Preadmission screen  

## 2018-10-14 ENCOUNTER — Other Ambulatory Visit: Payer: Self-pay

## 2018-10-14 ENCOUNTER — Encounter (HOSPITAL_COMMUNITY): Payer: Self-pay

## 2018-10-14 ENCOUNTER — Telehealth: Payer: Self-pay

## 2018-10-14 ENCOUNTER — Other Ambulatory Visit: Payer: Self-pay | Admitting: Obstetrics and Gynecology

## 2018-10-14 DIAGNOSIS — R1110 Vomiting, unspecified: Secondary | ICD-10-CM

## 2018-10-14 MED ORDER — ONDANSETRON HCL 4 MG PO TABS: ORAL_TABLET | ORAL | 0 refills | Status: DC

## 2018-10-14 MED ORDER — PANTOPRAZOLE SODIUM 40 MG PO TBEC: 80.0000 mg | DELAYED_RELEASE_TABLET | Freq: Every day | ORAL | 0 refills | Status: DC

## 2018-10-14 MED ORDER — PROMETHAZINE HCL 25 MG RE SUPP: 25.0000 mg | Freq: Four times a day (QID) | RECTAL | 0 refills | Status: DC | PRN

## 2018-10-14 NOTE — Telephone Encounter (Signed)
Contacted to advise that provider sent rx to pharmacy.

## 2018-10-14 NOTE — Telephone Encounter (Signed)
Returned call, pt requested refill on nausea medications, routed to provider for review.

## 2018-10-15 ENCOUNTER — Ambulatory Visit (INDEPENDENT_AMBULATORY_CARE_PROVIDER_SITE_OTHER): Payer: Medicaid Other | Admitting: Obstetrics and Gynecology

## 2018-10-15 ENCOUNTER — Encounter: Payer: Self-pay | Admitting: Obstetrics and Gynecology

## 2018-10-15 VITALS — BP 136/85 | HR 78 | Wt 261.8 lb

## 2018-10-15 DIAGNOSIS — Z98891 History of uterine scar from previous surgery: Secondary | ICD-10-CM

## 2018-10-15 DIAGNOSIS — O3430 Maternal care for cervical incompetence, unspecified trimester: Secondary | ICD-10-CM

## 2018-10-15 DIAGNOSIS — O0993 Supervision of high risk pregnancy, unspecified, third trimester: Secondary | ICD-10-CM

## 2018-10-15 NOTE — Progress Notes (Signed)
   PRENATAL VISIT NOTE  Subjective:  Samantha Guzman is a 32 y.o. [id number] at 6452w2d being seen today for ongoing prenatal care.  She is currently monitored for the following issues for this high-risk pregnancy and has High-risk pregnancy; Hyperemesis; Cervical incompetence, antepartum condition or complication; Previous cesarean section; Obesity complicating pregnancy; Depressive disorder, not elsewhere classified; Obesity in pregnancy; Hyperemesis gravidarum before end of [redacted] week gestation with electrolyte imbalance; Hypokalemia; Cervical cerclage suture present; Bloody emesis; and Herpes genitalia on their problem list.  Patient reports no complaints.  Contractions: Irritability. Vag. Bleeding: None.  Movement: Present. Denies leaking of fluid.   The following portions of the patient's history were reviewed and updated as appropriate: allergies, current medications, past family history, past medical history, past social history, past surgical history and problem list. Problem list updated.  Objective:   Vitals:   10/15/18 1617  BP: 136/85  Pulse: 78  Weight: 261 lb 12.8 oz (118.8 kg)    Fetal Status: Fetal Heart Rate (bpm): 136 Fundal Height: 38 cm Movement: Present     General:  Alert, oriented and cooperative. Patient is in no acute distress.  Skin: Skin is warm and dry. No rash noted.   Cardiovascular: Normal heart rate noted  Respiratory: Normal respiratory effort, no problems with respiration noted  Abdomen: Soft, gravid, appropriate for gestational age.  Pain/Pressure: Present     Pelvic: Cervical exam deferred        Extremities: Normal range of motion.  Edema: Trace  Mental Status: Normal mood and affect. Normal behavior. Normal judgment and thought content.   Assessment and Plan:  Pregnancy: [id number] at 1052w2d  1. High-risk pregnancy in third trimester Patient is doing well without complaints  2. Previous cesarean section Patient scheduled for repeat c-section on 12/17 with  plans for depo-provera  3. Cervical incompetence, antepartum condition or complication S/p cerclage  Term labor symptoms and general obstetric precautions including but not limited to vaginal bleeding, contractions, leaking of fluid and fetal movement were reviewed in detail with the patient. Please refer to After Visit Summary for other counseling recommendations.  Return in about 6 weeks (around 11/26/2018) for postpartum.  Future Appointments  Date Time Provider Department Center  10/21/2018  9:15 AM [facility] PAT 5 [facility] None    Catalina AntiguaPeggy Charmagne Buhl, MD

## 2018-10-21 ENCOUNTER — Encounter (HOSPITAL_COMMUNITY)
Admission: RE | Admit: 2018-10-21 | Discharge: 2018-10-21 | Disposition: A | Discharge status: Home / Self Care | Payer: Medicaid Other | Source: Ambulatory Visit | Attending: Obstetrics and Gynecology | Admitting: Obstetrics and Gynecology

## 2018-10-21 LAB — CBC
HCT: 30.4 % — ABNORMAL LOW (ref 36.0–46.0)
Hemoglobin: 9.4 g/dL — ABNORMAL LOW (ref 12.0–15.0)
MCH: 24.3 pg — ABNORMAL LOW (ref 26.0–34.0)
MCHC: 30.9 g/dL (ref 30.0–36.0)
MCV: 78.6 fL — ABNORMAL LOW (ref 80.0–100.0)
Platelets: 251 10*3/uL (ref 150–400)
RBC: 3.87 MIL/uL (ref 3.87–5.11)
RDW: 14.5 % (ref 11.5–15.5)
WBC: 7.2 10*3/uL (ref 4.0–10.5)
nRBC: 0 % (ref 0.0–0.2)

## 2018-10-21 LAB — RAPID HIV SCREEN (HIV 1/2 AB+AG)
HIV 1/2 Antibodies: NONREACTIVE
HIV-1 P24 ANTIGEN - HIV24: NONREACTIVE

## 2018-10-21 NOTE — Patient Instructions (Signed)
Samantha Guzman  10/21/2018   Your procedure is scheduled on:  10/22/2018  [facility] at 0830 AM.  Pick up the phone at the desk and dial [phone]  Call this number if you have problems the morning of surgery:[phone]  Remember:   Do not eat food:(After Midnight) Desps de medianoche.  Do not drink clear liquids: (After Midnight) Desps de medianoche.  Take these medicines the morning of surgery with A SIP OF WATER: take valtrex and may take protonix if needed   Do not wear jewelry, make-up or nail polish.  Do not wear lotions, powders, or perfumes. Do not wear deodorant.  Do not shave 48 hours prior to surgery.  Do not bring valuables to the hospital.  [facility] is not   responsible for any belongings or valuables brought to the hospital.  Contacts, dentures or bridgework may not be worn into surgery.  Leave suitcase in the car. After surgery it may be brought to your room.  For patients admitted to the hospital, checkout time is 11:00 AM the day of              discharge.    N/A   Please read over the following fact sheets that you were given:   Surgical Site Infection Prevention

## 2018-10-22 ENCOUNTER — Inpatient Hospital Stay (HOSPITAL_COMMUNITY): Payer: Medicaid Other | Admitting: Anesthesiology

## 2018-10-22 ENCOUNTER — Encounter (HOSPITAL_COMMUNITY)
Admission: AD | Disposition: A | Discharge status: Home / Self Care | Payer: Self-pay | Source: Home / Self Care | Attending: Family Medicine

## 2018-10-22 ENCOUNTER — Inpatient Hospital Stay (HOSPITAL_COMMUNITY)
Admission: AD | Admit: 2018-10-22 | Discharge: 2018-10-24 | DRG: 787 | Disposition: A | Discharge status: Home / Self Care | Payer: Medicaid Other | Attending: Family Medicine | Admitting: Family Medicine

## 2018-10-22 ENCOUNTER — Encounter (HOSPITAL_COMMUNITY): Payer: Self-pay | Admitting: *Deleted

## 2018-10-22 ENCOUNTER — Other Ambulatory Visit: Payer: Self-pay

## 2018-10-22 DIAGNOSIS — D649 Anemia, unspecified: Secondary | ICD-10-CM | POA: Diagnosis not present

## 2018-10-22 DIAGNOSIS — O9832 Other infections with a predominantly sexual mode of transmission complicating childbirth: Secondary | ICD-10-CM | POA: Diagnosis not present

## 2018-10-22 DIAGNOSIS — O3433 Maternal care for cervical incompetence, third trimester: Secondary | ICD-10-CM

## 2018-10-22 DIAGNOSIS — O3430 Maternal care for cervical incompetence, unspecified trimester: Secondary | ICD-10-CM | POA: Diagnosis present

## 2018-10-22 DIAGNOSIS — Z87891 Personal history of nicotine dependence: Secondary | ICD-10-CM

## 2018-10-22 DIAGNOSIS — R030 Elevated blood-pressure reading, without diagnosis of hypertension: Secondary | ICD-10-CM | POA: Diagnosis not present

## 2018-10-22 DIAGNOSIS — A6000 Herpesviral infection of urogenital system, unspecified: Secondary | ICD-10-CM | POA: Diagnosis present

## 2018-10-22 DIAGNOSIS — O99214 Obesity complicating childbirth: Secondary | ICD-10-CM | POA: Diagnosis present

## 2018-10-22 DIAGNOSIS — O99210 Obesity complicating pregnancy, unspecified trimester: Secondary | ICD-10-CM | POA: Diagnosis present

## 2018-10-22 DIAGNOSIS — O34211 Maternal care for low transverse scar from previous cesarean delivery: Secondary | ICD-10-CM | POA: Diagnosis not present

## 2018-10-22 DIAGNOSIS — E876 Hypokalemia: Secondary | ICD-10-CM

## 2018-10-22 DIAGNOSIS — K920 Hematemesis: Secondary | ICD-10-CM

## 2018-10-22 DIAGNOSIS — Z3A39 39 weeks gestation of pregnancy: Secondary | ICD-10-CM

## 2018-10-22 DIAGNOSIS — O0990 Supervision of high risk pregnancy, unspecified, unspecified trimester: Secondary | ICD-10-CM

## 2018-10-22 DIAGNOSIS — O9902 Anemia complicating childbirth: Secondary | ICD-10-CM | POA: Diagnosis not present

## 2018-10-22 DIAGNOSIS — Z98891 History of uterine scar from previous surgery: Secondary | ICD-10-CM

## 2018-10-22 HISTORY — DX: Herpesviral infection of urogenital system, unspecified: A60.00

## 2018-10-22 LAB — CBC
HCT: 25.8 % — ABNORMAL LOW (ref 36.0–46.0)
Hemoglobin: 8.1 g/dL — ABNORMAL LOW (ref 12.0–15.0)
MCH: 24.5 pg — ABNORMAL LOW (ref 26.0–34.0)
MCHC: 31.4 g/dL (ref 30.0–36.0)
MCV: 78.2 fL — ABNORMAL LOW (ref 80.0–100.0)
Platelets: 221 10*3/uL (ref 150–400)
RBC: 3.3 MIL/uL — ABNORMAL LOW (ref 3.87–5.11)
RDW: 14.3 % (ref 11.5–15.5)
WBC: 11.4 10*3/uL — ABNORMAL HIGH (ref 4.0–10.5)
nRBC: 0 % (ref 0.0–0.2)

## 2018-10-22 LAB — RPR: RPR: NONREACTIVE

## 2018-10-22 LAB — TYPE AND SCREEN
ABO/RH(D): O POS
Antibody Screen: NEGATIVE

## 2018-10-22 LAB — CREATININE, SERUM
Creatinine, Ser: 0.61 mg/dL (ref 0.44–1.00)
GFR calc Af Amer: >60 mL/min (ref >60)
GFR calc non Af Amer: >60 mL/min (ref >60)

## 2018-10-22 SURGERY — Surgical Case
Anesthesia: Spinal | Site: Abdomen | Wound class: Clean Contaminated

## 2018-10-22 MED ORDER — BUPIVACAINE IN DEXTROSE 0.75-8.25 % IT SOLN
INTRATHECAL | Status: DC | PRN
  Administered 2018-10-22: 1.6 mL via INTRATHECAL

## 2018-10-22 MED ORDER — OXYCODONE HCL 5 MG PO TABS
5.0000 mg | ORAL_TABLET | ORAL | Status: DC | PRN
  Administered 2018-10-23: 10 mg via ORAL
  Administered 2018-10-23 (×2): 5 mg via ORAL
  Administered 2018-10-24: 10 mg via ORAL
  Filled 2018-10-22: qty 1
  Filled 2018-10-22 (×3): qty 2
  Filled 2018-10-22: qty 1

## 2018-10-22 MED ORDER — MENTHOL 3 MG MT LOZG: 1.0000 | LOZENGE | OROMUCOSAL | Status: DC | PRN

## 2018-10-22 MED ORDER — ENOXAPARIN SODIUM 60 MG/0.6ML ~~LOC~~ SOLN
60.0000 mg | SUBCUTANEOUS | Status: DC
  Administered 2018-10-23: 60 mg via SUBCUTANEOUS
  Filled 2018-10-22 (×2): qty 0.6

## 2018-10-22 MED ORDER — SCOPOLAMINE 1 MG/3DAYS TD PT72: 1.0000 | MEDICATED_PATCH | TRANSDERMAL | Status: DC

## 2018-10-22 MED ORDER — DEXAMETHASONE SODIUM PHOSPHATE 4 MG/ML IJ SOLN
INTRAMUSCULAR | Status: DC | PRN
  Administered 2018-10-22: 4 mg via INTRAVENOUS

## 2018-10-22 MED ORDER — ACETAMINOPHEN 325 MG PO TABS
650.0000 mg | ORAL_TABLET | ORAL | Status: DC | PRN
  Administered 2018-10-22 – 2018-10-24 (×5): 650 mg via ORAL
  Filled 2018-10-22 (×5): qty 2

## 2018-10-22 MED ORDER — DIPHENHYDRAMINE HCL 25 MG PO CAPS: 25.0000 mg | ORAL_CAPSULE | Freq: Four times a day (QID) | ORAL | Status: DC | PRN

## 2018-10-22 MED ORDER — HYDROMORPHONE HCL 1 MG/ML IJ SOLN: 0.2500 mg | INTRAMUSCULAR | Status: DC | PRN

## 2018-10-22 MED ORDER — NALBUPHINE HCL 10 MG/ML IJ SOLN: 5.0000 mg | Freq: Once | INTRAMUSCULAR | Status: AC | PRN

## 2018-10-22 MED ORDER — SIMETHICONE 80 MG PO CHEW
80.0000 mg | CHEWABLE_TABLET | ORAL | Status: DC
  Administered 2018-10-23 (×2): 80 mg via ORAL
  Filled 2018-10-22 (×2): qty 1

## 2018-10-22 MED ORDER — ONDANSETRON HCL 4 MG/2ML IJ SOLN
4.0000 mg | Freq: Three times a day (TID) | INTRAMUSCULAR | Status: DC | PRN
  Administered 2018-10-22: 4 mg via INTRAVENOUS
  Filled 2018-10-22: qty 2

## 2018-10-22 MED ORDER — ONDANSETRON HCL 4 MG/2ML IJ SOLN
INTRAMUSCULAR | Status: DC | PRN
  Administered 2018-10-22: 4 mg via INTRAVENOUS

## 2018-10-22 MED ORDER — OXYTOCIN 10 UNIT/ML IJ SOLN
INTRAMUSCULAR | Status: AC
  Filled 2018-10-22: qty 4

## 2018-10-22 MED ORDER — NALOXONE HCL 4 MG/10ML IJ SOLN
1.0000 ug/kg/h | INTRAVENOUS | Status: DC | PRN
  Filled 2018-10-22: qty 5

## 2018-10-22 MED ORDER — FENTANYL CITRATE (PF) 100 MCG/2ML IJ SOLN
INTRAMUSCULAR | Status: AC
  Filled 2018-10-22: qty 2

## 2018-10-22 MED ORDER — IBUPROFEN 600 MG PO TABS
600.0000 mg | ORAL_TABLET | Freq: Four times a day (QID) | ORAL | Status: DC
  Administered 2018-10-23 – 2018-10-24 (×7): 600 mg via ORAL
  Filled 2018-10-22 (×7): qty 1

## 2018-10-22 MED ORDER — LACTATED RINGERS IV SOLN
INTRAVENOUS | Status: DC | PRN
  Administered 2018-10-22: 11:00:00 via INTRAVENOUS

## 2018-10-22 MED ORDER — ACETAMINOPHEN 10 MG/ML IV SOLN
1000.0000 mg | Freq: Once | INTRAVENOUS | Status: DC | PRN
  Administered 2018-10-22: 1000 mg via INTRAVENOUS

## 2018-10-22 MED ORDER — NALOXONE HCL 0.4 MG/ML IJ SOLN: 0.4000 mg | INTRAMUSCULAR | Status: DC | PRN

## 2018-10-22 MED ORDER — SENNOSIDES-DOCUSATE SODIUM 8.6-50 MG PO TABS
2.0000 | ORAL_TABLET | ORAL | Status: DC
  Administered 2018-10-23 (×2): 2 via ORAL
  Filled 2018-10-22 (×2): qty 2

## 2018-10-22 MED ORDER — ZOLPIDEM TARTRATE 5 MG PO TABS: 5.0000 mg | ORAL_TABLET | Freq: Every evening | ORAL | Status: DC | PRN

## 2018-10-22 MED ORDER — COCONUT OIL OIL: 1.0000 "application " | TOPICAL_OIL | Status: DC | PRN

## 2018-10-22 MED ORDER — NALBUPHINE HCL 10 MG/ML IJ SOLN
5.0000 mg | Freq: Once | INTRAMUSCULAR | Status: AC | PRN
  Administered 2018-10-22: 5 mg via SUBCUTANEOUS

## 2018-10-22 MED ORDER — DIPHENHYDRAMINE HCL 25 MG PO CAPS
25.0000 mg | ORAL_CAPSULE | ORAL | Status: DC | PRN
  Filled 2018-10-22: qty 1

## 2018-10-22 MED ORDER — METOCLOPRAMIDE HCL 5 MG/ML IJ SOLN
INTRAMUSCULAR | Status: AC
  Filled 2018-10-22: qty 2

## 2018-10-22 MED ORDER — ONDANSETRON HCL 4 MG/2ML IJ SOLN
INTRAMUSCULAR | Status: AC
  Filled 2018-10-22: qty 2

## 2018-10-22 MED ORDER — PHENYLEPHRINE 8 MG IN D5W 100 ML (0.08MG/ML) PREMIX OPTIME
INJECTION | INTRAVENOUS | Status: DC | PRN
  Administered 2018-10-22: 60 ug/min via INTRAVENOUS

## 2018-10-22 MED ORDER — NALBUPHINE HCL 10 MG/ML IJ SOLN: 5.0000 mg | INTRAMUSCULAR | Status: DC | PRN

## 2018-10-22 MED ORDER — DIBUCAINE 1 % RE OINT: 1.0000 "application " | TOPICAL_OINTMENT | RECTAL | Status: DC | PRN

## 2018-10-22 MED ORDER — SIMETHICONE 80 MG PO CHEW: 80.0000 mg | CHEWABLE_TABLET | ORAL | Status: DC | PRN

## 2018-10-22 MED ORDER — WITCH HAZEL-GLYCERIN EX PADS: 1.0000 "application " | MEDICATED_PAD | CUTANEOUS | Status: DC | PRN

## 2018-10-22 MED ORDER — LACTATED RINGERS IV SOLN
INTRAVENOUS | Status: DC
  Administered 2018-10-22: 23:00:00 via INTRAVENOUS

## 2018-10-22 MED ORDER — SODIUM CHLORIDE 0.9 % IR SOLN
Status: DC | PRN
  Administered 2018-10-22: 1

## 2018-10-22 MED ORDER — DIPHENHYDRAMINE HCL 50 MG/ML IJ SOLN
12.5000 mg | INTRAMUSCULAR | Status: DC | PRN
  Administered 2018-10-22 (×2): 12.5 mg via INTRAVENOUS
  Filled 2018-10-22 (×2): qty 1

## 2018-10-22 MED ORDER — MEPERIDINE HCL 25 MG/ML IJ SOLN: 6.2500 mg | INTRAMUSCULAR | Status: DC | PRN

## 2018-10-22 MED ORDER — SCOPOLAMINE 1 MG/3DAYS TD PT72: 1.0000 | MEDICATED_PATCH | Freq: Once | TRANSDERMAL | Status: DC

## 2018-10-22 MED ORDER — DEXAMETHASONE SODIUM PHOSPHATE 4 MG/ML IJ SOLN
INTRAMUSCULAR | Status: AC
  Filled 2018-10-22: qty 1

## 2018-10-22 MED ORDER — METOCLOPRAMIDE HCL 5 MG/ML IJ SOLN
INTRAMUSCULAR | Status: DC | PRN
  Administered 2018-10-22: 5 mg via INTRAVENOUS

## 2018-10-22 MED ORDER — FENTANYL CITRATE (PF) 100 MCG/2ML IJ SOLN
INTRAMUSCULAR | Status: DC | PRN
  Administered 2018-10-22: 15 ug via INTRATHECAL

## 2018-10-22 MED ORDER — PROMETHAZINE HCL 25 MG/ML IJ SOLN: 6.2500 mg | INTRAMUSCULAR | Status: DC | PRN

## 2018-10-22 MED ORDER — SCOPOLAMINE 1 MG/3DAYS TD PT72
MEDICATED_PATCH | TRANSDERMAL | Status: DC | PRN
  Administered 2018-10-22: 1 via TRANSDERMAL

## 2018-10-22 MED ORDER — MORPHINE SULFATE (PF) 0.5 MG/ML IJ SOLN
INTRAMUSCULAR | Status: DC | PRN
  Administered 2018-10-22: .15 mg via INTRATHECAL

## 2018-10-22 MED ORDER — ACETAMINOPHEN 10 MG/ML IV SOLN
INTRAVENOUS | Status: AC
  Filled 2018-10-22: qty 100

## 2018-10-22 MED ORDER — NALBUPHINE HCL 10 MG/ML IJ SOLN
INTRAMUSCULAR | Status: AC
  Filled 2018-10-22: qty 1

## 2018-10-22 MED ORDER — OXYTOCIN 40 UNITS IN LACTATED RINGERS INFUSION - SIMPLE MED: 2.5000 [IU]/h | INTRAVENOUS | Status: AC

## 2018-10-22 MED ORDER — MORPHINE SULFATE (PF) 0.5 MG/ML IJ SOLN
INTRAMUSCULAR | Status: AC
  Filled 2018-10-22: qty 10

## 2018-10-22 MED ORDER — OXYTOCIN 10 UNIT/ML IJ SOLN
INTRAVENOUS | Status: DC | PRN
  Administered 2018-10-22: 40 [IU] via INTRAVENOUS

## 2018-10-22 MED ORDER — CEFAZOLIN SODIUM-DEXTROSE 2-4 GM/100ML-% IV SOLN
2.0000 g | INTRAVENOUS | Status: AC
  Administered 2018-10-22: 2 g via INTRAVENOUS
  Filled 2018-10-22: qty 100

## 2018-10-22 MED ORDER — LACTATED RINGERS IV SOLN
125.0000 mL/h | INTRAVENOUS | Status: DC
  Administered 2018-10-22: 125 mL/h via INTRAVENOUS
  Administered 2018-10-22: 11:00:00 via INTRAVENOUS
  Administered 2018-10-22: 125 mL/h via INTRAVENOUS

## 2018-10-22 MED ORDER — PRENATAL MULTIVITAMIN CH
1.0000 | ORAL_TABLET | Freq: Every day | ORAL | Status: DC
  Administered 2018-10-23: 1 via ORAL
  Filled 2018-10-22: qty 1

## 2018-10-22 MED ORDER — SIMETHICONE 80 MG PO CHEW
80.0000 mg | CHEWABLE_TABLET | Freq: Three times a day (TID) | ORAL | Status: DC
  Administered 2018-10-23 – 2018-10-24 (×4): 80 mg via ORAL
  Filled 2018-10-22 (×4): qty 1

## 2018-10-22 MED ORDER — TETANUS-DIPHTH-ACELL PERTUSSIS 5-2.5-18.5 LF-MCG/0.5 IM SUSP: 0.5000 mL | Freq: Once | INTRAMUSCULAR | Status: AC

## 2018-10-22 MED ORDER — SODIUM CHLORIDE 0.9% FLUSH: 3.0000 mL | INTRAVENOUS | Status: DC | PRN

## 2018-10-22 SURGICAL SUPPLY — 36 items
APL SKNCLS STERI-STRIP NONHPOA (GAUZE/BANDAGES/DRESSINGS) ×1
BENZOIN TINCTURE PRP APPL 2/3 (GAUZE/BANDAGES/DRESSINGS) ×3 IMPLANT
CHLORAPREP W/TINT 26ML (MISCELLANEOUS) ×3 IMPLANT
CLAMP CORD UMBIL (MISCELLANEOUS) ×3 IMPLANT
CLOSURE STERI STRIP 1/2 X4 (GAUZE/BANDAGES/DRESSINGS) ×2 IMPLANT
CLOSURE WOUND 1/2 X4 (GAUZE/BANDAGES/DRESSINGS) ×1
CLOTH BEACON ORANGE TIMEOUT ST (SAFETY) ×3 IMPLANT
DRSG OPSITE POSTOP 4X10 (GAUZE/BANDAGES/DRESSINGS) ×3 IMPLANT
ELECT REM PT RETURN 9FT ADLT (ELECTROSURGICAL) ×3
ELECTRODE REM PT RTRN 9FT ADLT (ELECTROSURGICAL) ×1 IMPLANT
EXTRACTOR VACUUM M CUP 4 TUBE (SUCTIONS) IMPLANT
EXTRACTOR VACUUM M CUP 4' TUBE (SUCTIONS)
GLOVE BIOGEL PI IND STRL 7.0 (GLOVE) ×2 IMPLANT
GLOVE BIOGEL PI IND STRL 7.5 (GLOVE) ×2 IMPLANT
GLOVE BIOGEL PI INDICATOR 7.0 (GLOVE) ×4
GLOVE BIOGEL PI INDICATOR 7.5 (GLOVE) ×4
GLOVE ECLIPSE 7.5 STRL STRAW (GLOVE) ×3 IMPLANT
GOWN STRL REUS W/TWL LRG LVL3 (GOWN DISPOSABLE) ×9 IMPLANT
HOVERMATT SINGLE USE (MISCELLANEOUS) ×3 IMPLANT
KIT ABG SYR 3ML LUER SLIP (SYRINGE) IMPLANT
NEEDLE HYPO 25X5/8 SAFETYGLIDE (NEEDLE) IMPLANT
NS IRRIG 1000ML POUR BTL (IV SOLUTION) ×3 IMPLANT
PACK C SECTION WH (CUSTOM PROCEDURE TRAY) ×3 IMPLANT
PAD OB MATERNITY 4.3X12.25 (PERSONAL CARE ITEMS) ×3 IMPLANT
PENCIL SMOKE EVAC W/HOLSTER (ELECTROSURGICAL) ×3 IMPLANT
RTRCTR C-SECT PINK 25CM LRG (MISCELLANEOUS) ×3 IMPLANT
SPONGE LAP 18X18 RF (DISPOSABLE) ×9 IMPLANT
STRIP CLOSURE SKIN 1/2X4 (GAUZE/BANDAGES/DRESSINGS) ×2 IMPLANT
SUT PLAIN 2 0 XLH (SUTURE) ×3 IMPLANT
SUT VIC AB 0 CTX 36 (SUTURE) ×9
SUT VIC AB 0 CTX36XBRD ANBCTRL (SUTURE) ×3 IMPLANT
SUT VIC AB 2-0 CT1 27 (SUTURE) ×3
SUT VIC AB 2-0 CT1 TAPERPNT 27 (SUTURE) ×1 IMPLANT
SUT VIC AB 4-0 KS 27 (SUTURE) ×3 IMPLANT
TOWEL OR 17X24 6PK STRL BLUE (TOWEL DISPOSABLE) ×3 IMPLANT
TRAY FOLEY W/BAG SLVR 14FR LF (SET/KITS/TRAYS/PACK) ×3 IMPLANT

## 2018-10-22 NOTE — H&P (Signed)
Faculty Practice H&P  Samantha Guzman is a 32 y.o. female [phone] with IUP at [redacted]w[redacted]d presenting for elective repeat cesarean section. Pregnancy was been complicated by prior c/s x2, hyperemesis, obesity, anemia, genital herpes on valtrex, incompetent cervix with cerclage placement, which was removed at 36 wk appointment on 11/25.    Pt states she has been having no contractions, no vaginal bleeding, intact membranes, with normal fetal movement.     Prenatal Course Source of Care: [facility] with onset of care at 8weeks  Pregnancy complications or risks: Patient Active Problem List   Diagnosis Date Noted  . Herpes genitalia 09/19/2018  . Bloody emesis 06/23/2018  . Hypokalemia 05/08/2018  . Cervical cerclage suture present 05/08/2018  . Hyperemesis gravidarum before end of [redacted] week gestation with electrolyte imbalance 05/06/2018  . Obesity in pregnancy 04/18/2018  . Depressive disorder, not elsewhere classified 02/26/2014  . Previous cesarean section 10/01/2013  . Obesity complicating pregnancy 10/01/2013  . Cervical incompetence, antepartum condition or complication 07/16/2013  . Hyperemesis 07/03/2013  . High-risk pregnancy 06/30/2013   She desires Depo-Provera for contraception.  She plans to bottle feed  Prenatal labs and studies: ABO, Rh: --/--/O POS (07/01 1606) Antibody: NEG (07/01 1606) Rubella: 3.04 (05/09 1526) RPR: Non Reactive (12/16 0919)  HBsAg: NON-REACTIVE (05/09 1526)  HIV: NON REACTIVE (12/16 0919)  GBS: Negative (11/25 1703)  2hr Glucola: negative Genetic screening: normal Anatomy US: normal  Past Medical History:  Past Medical History:  Diagnosis Date  . Anxiety   . Depression    brief hx, ok now  . GERD (gastroesophageal reflux disease)   . Hyperemesis   . Incompetent cervix in pregnancy   . Infection    UTI  . No significant past medical history   . Vertigo     Past Surgical History:  Past Surgical History:  Procedure Laterality Date  .  CERVICAL CERCLAGE N/A 08/09/2013   Procedure: CERCLAGE CERVICAL;  Surgeon: Brock Bad, MD;  Location: [facility];  Service: Gynecology;  Laterality: N/A;  . CERVICAL CERCLAGE N/A 04/26/2018   Procedure: CERCLAGE CERVICAL;  Surgeon: Reva Bores, MD;  Location: [facility];  Service: Gynecology;  Laterality: N/A;  . CESAREAN SECTION    . CESAREAN SECTION N/A 02/02/2014   Procedure: REPEAT CESAREAN SECTION;  Surgeon: Brock Bad, MD;  Location: [facility];  Service: Obstetrics;  Laterality: N/A;    Obstetrical History:  OB History    Gravida  5   Para  4   Term  2   Preterm  2   AB  0   Living  2     SAB  0   TAB      Ectopic      Multiple      Live Births  2           Gynecological History:  OB History    Gravida  5   Para  4   Term  2   Preterm  2   AB  0   Living  2     SAB  0   TAB      Ectopic      Multiple      Live Births  2           Social History:  Social History   Socioeconomic History  . Marital status: Single    Spouse name: Not on file  . Number of children: Not on file  . Years of education:  Not on file  . Highest education level: Not on file  Occupational History  . Not on file  Social Needs  . Financial resource strain: Not hard at all  . Food insecurity:    Worry: Never true    Inability: Never true  . Transportation needs:    Medical: No    Non-medical: Not on file  Tobacco Use  . Smoking status: Former Smoker    Types: Cigarettes    Last attempt to quit: 06/06/2009    Years since quitting: 9.3  . Smokeless tobacco: Never Used  Substance and Sexual Activity  . Alcohol use: No  . Drug use: No  . Sexual activity: Not Currently    Partners: Male  Lifestyle  . Physical activity:    Days per week: Not on file    Minutes per session: Not on file  . Stress: Only a little  Relationships  . Social connections:    Talks on phone: Not on file    Gets together: Not on file    Attends religious service: Not on  file    Active member of club or organization: Not on file    Attends meetings of clubs or organizations: Not on file    Relationship status: Not on file  Other Topics Concern  . Not on file  Social History Narrative  . Not on file    Family History:  Family History  Problem Relation Age of Onset  . Stroke Father   . Heart disease Father     Medications:  Prenatal vitamins,  Current Facility-Administered Medications  Medication Dose Route Frequency Provider Last Rate Last Dose  . ceFAZolin (ANCEF) IVPB 2g/100 mL premix  2 g Intravenous On Call to OR Levie HeritageStinson, Jacob J, DO      . lactated ringers infusion  125 mL/hr Intravenous Continuous Levie HeritageStinson, Jacob J, DO        Allergies:  Allergies  Allergen Reactions  . Tramadol Itching    Review of Systems: - negative  Physical Exam: Blood pressure 138/81, pulse 89, temperature 98.5 F (36.9 C), temperature source Oral, resp. rate 18, height 5\' 6"  (1.676 m), weight 120.7 kg, last menstrual period 01/14/2018, SpO2 100 %. GENERAL: Well-developed, well-nourished female in no acute distress.  LUNGS: Clear to auscultation bilaterally.  HEART: Regular rate and rhythm. ABDOMEN: Soft, nontender, nondistended, gravid.  EXTREMITIES: Nontender, no edema, 2+ distal pulses. FHT:  Baseline rate 135 bpm      Pertinent Labs/Studies:   Lab Results  Component Value Date   WBC 7.2 10/21/2018   HGB 9.4 (L) 10/21/2018   HCT 30.4 (L) 10/21/2018   MCV 78.6 (L) 10/21/2018   PLT 251 10/21/2018    Assessment : Samantha Guzman is a 32 y.o. [id number] at 441w2d being admitted for cesarean section secondary to elective repeat  Plan: The risks of cesarean section discussed with the patient included but were not limited to: bleeding which may require transfusion or reoperation; infection which may require antibiotics; injury to bowel, bladder, ureters or other surrounding organs; injury to the fetus; need for additional procedures including hysterectomy in  the event of a life-threatening hemorrhage; placental abnormalities wth subsequent pregnancies, incisional problems, thromboembolic phenomenon and other postoperative/anesthesia complications. The patient concurred with the proposed plan, giving informed written consent for the procedure.   Patient has been NPO since last night and will remain NPO for procedure.  Preoperative prophylactic Ancef ordered on call to the OR.    Levie HeritageStinson, Jacob J, DO  10/22/2018, 8:59 AM

## 2018-10-22 NOTE — Anesthesia Preprocedure Evaluation (Addendum)
Anesthesia Evaluation  Patient identified by MRN, date of birth, ID band Patient awake    Reviewed: Allergy & Precautions, NPO status , Patient's Chart, lab work & pertinent test results  Airway Mallampati: II  TM Distance: >3 FB Neck ROM: Full    Dental no notable dental hx.    Pulmonary former smoker,    Pulmonary exam normal breath sounds clear to auscultation       Cardiovascular negative cardio ROS Normal cardiovascular exam Rhythm:Regular Rate:Normal     Neuro/Psych PSYCHIATRIC DISORDERS Anxiety Depression Vertigo negative neurological ROS     GI/Hepatic Neg liver ROS, GERD  Medicated and Controlled,  Endo/Other  Morbid obesity  Renal/GU negative Renal ROS     Musculoskeletal negative musculoskeletal ROS (+)   Abdominal (+) + obese,   Peds  Hematology  (+) anemia ,   Anesthesia Other Findings  Repeat C-S  Reproductive/Obstetrics (+) Pregnancy                            Anesthesia Physical Anesthesia Plan  ASA: II  Anesthesia Plan: Spinal   Post-op Pain Management:    Induction: Intravenous  PONV Risk Score and Plan: 2 and Ondansetron, Scopolamine patch - Pre-op and Treatment may vary due to age or medical condition  Airway Management Planned: Natural Airway  Additional Equipment:   Intra-op Plan:   Post-operative Plan:   Informed Consent: I have reviewed the patients History and Physical, chart, labs and discussed the procedure including the risks, benefits and alternatives for the proposed anesthesia with the patient or authorized representative who has indicated his/her understanding and acceptance.   Dental advisory given  Plan Discussed with: CRNA  Anesthesia Plan Comments:         Anesthesia Quick Evaluation

## 2018-10-22 NOTE — Transfer of Care (Signed)
Immediate Anesthesia Transfer of Care Note  Patient: Samantha Guzman  Procedure(s) Performed: CESAREAN SECTION (N/A Abdomen)  Patient Location: PACU  Anesthesia Type:Spinal  Level of Consciousness: awake  Airway & Oxygen Therapy: Patient Spontanous Breathing  Post-op Assessment: Report given to RN  Post vital signs: Reviewed and stable  Last Vitals:  Vitals Value Taken Time  BP    Temp    Pulse    Resp    SpO2      Last Pain:  Vitals:   10/22/18 0854  TempSrc: Oral  PainSc:       Patients Stated Pain Goal: 5 (10/22/18 0839)  Complications: No apparent anesthesia complications

## 2018-10-22 NOTE — Lactation Note (Signed)
This note was copied from a baby's chart. Lactation Consultation Note  Patient Name: Samantha Guzman     [facility]  Mother's feeding choice is formula.  She will not need our lactation services.                [name], 3:00 PM

## 2018-10-22 NOTE — Anesthesia Postprocedure Evaluation (Signed)
Anesthesia Post Note  Patient: Samantha NapoleonRenee Sulewski  Procedure(s) Performed: CESAREAN SECTION (N/A Abdomen)     Patient location during evaluation: Mother Baby Anesthesia Type: Spinal Level of consciousness: awake and alert Pain management: pain level controlled Vital Signs Assessment: post-procedure vital signs reviewed and stable Respiratory status: spontaneous breathing, nonlabored ventilation and respiratory function stable Cardiovascular status: stable Postop Assessment: no headache, no backache and epidural receding Anesthetic complications: no    Last Vitals:  Vitals:   10/22/18 1700 10/22/18 1835  BP: (!) 147/77 130/87  Pulse: 65 70  Resp: 18   Temp: 36.8 C   SpO2: 100%     Last Pain:  Vitals:   10/22/18 1547  TempSrc: Axillary  PainSc:    Pain Goal: Patients Stated Pain Goal: 5 (10/22/18 0839)               Marrion CoyMERRITT,Saul Fabiano

## 2018-10-22 NOTE — Anesthesia Procedure Notes (Signed)
Spinal  Patient location during procedure: OR Start time: 10/22/2018 10:30 AM End time: 10/22/2018 10:35 AM Staffing Anesthesiologist: Leonides GrillsEllender, Ryan P, MD Other anesthesia staff: Bethanne GingerLafuria, Paytin Ramakrishnan D, RN Performed: other anesthesia staff  Preanesthetic Checklist Completed: patient identified, site marked, surgical consent, pre-op evaluation, timeout performed, IV checked, risks and benefits discussed and monitors and equipment checked Spinal Block Patient position: sitting Prep: ChloraPrep Patient monitoring: heart rate, continuous pulse ox and blood pressure Approach: midline Location: L4-5 Injection technique: single-shot Needle Needle type: Pencan  Needle gauge: 25 G Assessment Sensory level: T4

## 2018-10-22 NOTE — Op Note (Addendum)
Jacy Arrazola PROCEDURE DATE: 10/22/2018  PREOPERATIVE DIAGNOSIS: Intrauterine pregnancy at  [redacted]w[redacted]d weeks gestation; history of prior Cesarean section x 2  POSTOPERATIVE DIAGNOSIS: The same  including lysis of adhesions  PROCEDURE: repeat Low Transverse Cesarean Section  SURGEON:  Dr. Candelaria Celeste  ASSISTANT: Dr. Rhett Bannister  INDICATIONS: Samantha Guzman is a 32 y.o. [id number] at [redacted]w[redacted]d scheduled for repeat cesarean section. The risks of cesarean section discussed with the patient included but were not limited to: bleeding which may require transfusion or reoperation; infection which may require antibiotics; injury to bowel, bladder, ureters or other surrounding organs; injury to the fetus; need for additional procedures including hysterectomy in the event of a life-threatening hemorrhage; placental abnormalities wth subsequent pregnancies, incisional problems, thromboembolic phenomenon and other postoperative/anesthesia complications. The patient concurred with the proposed plan, giving informed written consent for the procedure.    FINDINGS:  Viable female infant in vertex presentation.  Apgars 8 and 8, weight pending.  Clear amniotic fluid.  Intact placenta, three vessel cord.  Normal uterus, fallopian tubes and ovaries bilaterally.  ANESTHESIA:    Spinal INTRAVENOUS FLUIDS:2300 ml ESTIMATED BLOOD LOSS: 378 ml URINE OUTPUT:  50 ml SPECIMENS: Placenta sent to L&D COMPLICATIONS: None immediate  PROCEDURE IN DETAIL:  The patient received intravenous antibiotics and had sequential compression devices applied to her lower extremities while in the preoperative area.  She was then taken to the operating room where spinal anesthesia was administered and was found to be adequate. She was then placed in a dorsal supine position with a leftward tilt, and prepped and draped in a sterile manner.  A foley catheter was placed into her bladder and attached to constant gravity, which drained clear fluid  throughout.  After an adequate timeout was performed, a Pfannenstiel skin incision was made with scalpel and carried through to the underlying layer of fascia. The fascia was incised in the midline and this incision was extended bilaterally using the Mayo scissors. Lysis of adhesions performed at this stage given fascia adherent to rectus. Kocher clamps were applied to the superior aspect of the fascial incision and the underlying rectus muscles were dissected off sharply. The rectus muscles were separated in the midline sharply and the peritoneum was entered bluntly. An Alexis retractor was placed to aid in visualization of the uterus.  Attention was turned to the lower uterine segment, where the bladder was adhered to the lower uterine segment. The vesicoperitoneum was elevated and Metzenbaum scissors were used to create a bladder flap. A transverse hysterotomy was made with a scalpel and extended bilaterally bluntly. The infant was successfully delivered, and cord was clamped and cut and infant was handed over to awaiting neonatology team. Uterine massage was then administered and the placenta delivered intact with three-vessel cord. The uterus was then cleared of clot and debris.  The hysterotomy was closed with 0 Vicryl in a running locked fashion, and a figure of 8 stitch was used to aid in midline imbrication. Hemostasis noted no all surfaces. The abdomen and the pelvis were cleared of all clot and debris and the Jon Gills was removed. Hemostasis was confirmed on all surfaces.  The peritoneum was reapproximated using 2-0 vicryl running stitches. The fascia was then closed using 0 Vicryl in a running fashion. The subcutaneous layer was reapproximated with plain gut and the skin was closed with 4-0 vicryl. The patient tolerated the procedure well. Sponge, lap, instrument and needle counts were correct x 2. She was taken to the recovery room in stable  condition.   Tamera StandsWallace, Laurel S, DO 10/22/2018 12:19  PM    Attestation of Attending Supervision of Obstetric Fellow: Evaluation and management procedures were performed by the Obstetric Fellow under my supervision and collaboration.  I have reviewed the Obstetric Fellow's note and chart, and I agree with the management and plan.  Candelaria CelesteJacob Chelly Dombeck, DO Attending Physician Faculty Practice, [facility]

## 2018-10-23 LAB — CBC
HCT: 24.7 % — ABNORMAL LOW (ref 36.0–46.0)
Hemoglobin: 7.8 g/dL — ABNORMAL LOW (ref 12.0–15.0)
MCH: 24.6 pg — AB (ref 26.0–34.0)
MCHC: 31.6 g/dL (ref 30.0–36.0)
MCV: 77.9 fL — AB (ref 80.0–100.0)
Platelets: 197 10*3/uL (ref 150–400)
RBC: 3.17 MIL/uL — ABNORMAL LOW (ref 3.87–5.11)
RDW: 14.2 % (ref 11.5–15.5)
WBC: 10.4 10*3/uL (ref 4.0–10.5)
nRBC: 0 % (ref 0.0–0.2)

## 2018-10-23 LAB — BIRTH TISSUE RECOVERY COLLECTION (PLACENTA DONATION)

## 2018-10-23 MED ORDER — FERROUS SULFATE 325 (65 FE) MG PO TABS
325.0000 mg | ORAL_TABLET | Freq: Two times a day (BID) | ORAL | Status: DC
  Administered 2018-10-23 (×2): 325 mg via ORAL
  Filled 2018-10-23 (×2): qty 1

## 2018-10-23 NOTE — Progress Notes (Signed)
POSTPARTUM PROGRESS NOTE  Post Partum Day 1  Subjective:  Samantha Guzman is a 32 y.o. [id number] s/p rLTCS at 1465w2d.  She reports she is doing well. No acute events overnight. She denies any problems with ambulating, voiding or po intake. Denies nausea or vomiting. Pain is moderately controlled, pt had incision-site pain but had just taken Motrin. Report mild SOB associated with brief episode of pain, otherwise no SOB. Lochia is mild. No dizziness.    Objective: Blood pressure 124/74, pulse 60, temperature 98.4 F (36.9 C), temperature source Oral, resp. rate 16, height 5\' 6"  (1.676 m), weight 120.7 kg, last menstrual period 01/14/2018, SpO2 100 %, unknown if currently breastfeeding.  Physical Exam:  General: alert, cooperative and no distress Chest: no respiratory distress Heart:regular rate, distal pulses intact Abdomen: soft, nontender Uterine Fundus: firm, appropriately tender DVT Evaluation: No calf swelling or tenderness Extremities: No edema Skin: warm, dry Incision: Bandage dry, no surrounding erythema or induration  Recent Labs    10/22/18 2103 10/23/18 0557  HGB 8.1* 7.8*  HCT 25.8* 24.7*    Assessment/Plan: Samantha NapoleonRenee Guzman is a 32 y.o. [id number] s/p rLTCS at 4665w2d   PPD#1 - Doing well Routine postpartum care Anemia: Hgb 8.1 yesterday, down from 9.4 yesterday. 378 mL blood loss. Started Ferrous Sulfate 325mg  BID. Contraception: Depo Feeding: Bottle Dispo: Plan for discharge tomorrow.   LOS: 1 day   Trenda MootsElizabeth Dnyla Guzman 10/23/2018, 6:17 AM

## 2018-10-23 NOTE — Progress Notes (Signed)
[name] was referred for history of depression/anxiety. * Referral screened out by Clinical Social Worker because none of the following criteria appear to apply: ~ History of anxiety/depression during this pregnancy, or of post-partum depression following prior delivery. ~ Diagnosis of anxiety and/or depression within last 3 years. No concerns noted in OB records. OR * [name]'s symptoms currently being treated with medication and/or therapy.  Please contact the Clinical Social Worker if needs arise, by [name] request, or if [name] scores greater than 9/yes to question 10 on Edinburgh Postpartum Depression Screen.   Samantha Guzman, MSW, LCSW Clinical Social Work [phone]  

## 2018-10-24 ENCOUNTER — Encounter (HOSPITAL_COMMUNITY): Payer: Self-pay | Admitting: Family Medicine

## 2018-10-24 DIAGNOSIS — R030 Elevated blood-pressure reading, without diagnosis of hypertension: Secondary | ICD-10-CM | POA: Diagnosis not present

## 2018-10-24 MED ORDER — IBUPROFEN 600 MG PO TABS: 600.0000 mg | ORAL_TABLET | Freq: Four times a day (QID) | ORAL | 0 refills | Status: DC | PRN

## 2018-10-24 MED ORDER — FERROUS SULFATE 325 (65 FE) MG PO TABS: 325.0000 mg | ORAL_TABLET | Freq: Two times a day (BID) | ORAL | 3 refills | Status: AC

## 2018-10-24 MED ORDER — HYDROCHLOROTHIAZIDE 25 MG PO TABS: 25.0000 mg | ORAL_TABLET | Freq: Every day | ORAL | 1 refills | Status: AC

## 2018-10-24 MED ORDER — OXYCODONE HCL 5 MG PO TABS: 5.0000 mg | ORAL_TABLET | ORAL | 0 refills | Status: AC | PRN

## 2018-10-24 MED ORDER — HYDROCHLOROTHIAZIDE 25 MG PO TABS
25.0000 mg | ORAL_TABLET | Freq: Every day | ORAL | Status: DC
  Administered 2018-10-24: 25 mg via ORAL
  Filled 2018-10-24: qty 1

## 2018-10-24 NOTE — Discharge Summary (Signed)
OB Discharge Summary     Patient Name: Samantha NapoleonRenee Sames DOB: 22-Jan-1986 MRN: [id number]  Date of admission: 10/22/2018 Delivering MD: Levie HeritageSTINSON, JACOB J   Date of discharge: 10/24/2018  Admitting diagnosis: RCS Intrauterine pregnancy: 6550w2d     Secondary diagnosis:  Active Problems:   High-risk pregnancy   Cervical incompetence, antepartum condition or complication   Previous cesarean section   Obesity complicating pregnancy   Status post cesarean section   Cesarean delivery delivered   Borderline blood pressure  Additional problems: none     Discharge diagnosis: Term Pregnancy Delivered, Anemia and borderline blood pressure- postpartum                                                                                                Post partum procedures:none  Augmentation: N/A  Complications: None  Hospital course:  Scheduled C/S   32 y.o. yo [id number] at 1150w2d was admitted to the hospital 10/22/2018 for scheduled cesarean section with the following indication:Elective Repeat.  Membrane Rupture Time/Date: 11:12 AM ,10/22/2018   Patient delivered a Viable infant.10/22/2018  Details of operation can be found in separate operative note.  Pateint had a  postpartum course remarkable for being started on HCTZ prior to d/c due to BPs 141/82 and 138/69 with other pressures normotensive .  She is ambulating, tolerating a regular diet, passing flatus, and urinating well. Patient is discharged home in stable condition on  10/24/18 per her request for early d/c.         Physical exam  Vitals:   10/23/18 0913 10/23/18 1344 10/23/18 2312 10/24/18 0600  BP: 126/68 127/67 (!) 141/82 138/69  Pulse: 72 69 78 75  Resp: 18 18 18 18   Temp: 98 F (36.7 C) 98.5 F (36.9 C) 98.4 F (36.9 C) 97.6 F (36.4 C)  TempSrc:  Oral Oral Oral  SpO2: 100%  100% 100%  Weight:      Height:       General: alert and cooperative Lochia: appropriate Uterine Fundus: firm Incision: honeycomb intact DVT  Evaluation: No evidence of DVT seen on physical exam. Labs: Lab Results  Component Value Date   WBC 10.4 10/23/2018   HGB 7.8 (L) 10/23/2018   HCT 24.7 (L) 10/23/2018   MCV 77.9 (L) 10/23/2018   PLT 197 10/23/2018   CMP Latest Ref Rng & Units 10/22/2018  Glucose 70 - 99 mg/dL -  BUN 6 - 20 mg/dL -  Creatinine 5.620.44 - 1.301.00 mg/dL 8.650.61  Sodium 784135 - 696145 mmol/L -  Potassium 3.5 - 5.1 mmol/L -  Chloride 98 - 111 mmol/L -  CO2 22 - 32 mmol/L -  Calcium 8.9 - 10.3 mg/dL -  Total Protein 6.5 - 8.1 g/dL -  Total Bilirubin 0.3 - 1.2 mg/dL -  Alkaline Phos 38 - 295126 U/L -  AST 15 - 41 U/L -  ALT 0 - 44 U/L -    Discharge instruction: per After Visit Summary and "Baby and Me Booklet".  After visit meds:  Allergies as of 10/24/2018      Reactions   Tramadol Itching  Medication List    STOP taking these medications   pantoprazole 40 MG tablet Commonly known as:  PROTONIX   promethazine 25 MG suppository Commonly known as:  PHENERGAN     TAKE these medications   ferrous sulfate 325 (65 FE) MG tablet Take 1 tablet (325 mg total) by mouth 2 (two) times daily with a meal.   hydrochlorothiazide 25 MG tablet Commonly known as:  HYDRODIURIL Take 1 tablet (25 mg total) by mouth daily.   ibuprofen 600 MG tablet Commonly known as:  ADVIL,MOTRIN Take 1 tablet (600 mg total) by mouth every 6 (six) hours as needed.   oxyCODONE 5 MG immediate release tablet Commonly known as:  Oxy IR/ROXICODONE Take 1-2 tablets (5-10 mg total) by mouth every 4 (four) hours as needed for moderate pain.       Diet: routine diet  Activity: Advance as tolerated. Pelvic rest for 6 weeks.   Outpatient follow up:1-2wk incision and BP check, then 4wk PP visit Follow up Appt: Future Appointments  Date Time Provider Department Center  11/05/2018  9:45 AM Arvilla MarketWallace, Catherine Lauren, DO [facility] None  11/20/2018 10:30 AM Constant, Gigi GinPeggy, MD [facility] None   Follow up Visit:No follow-ups on  file.  Postpartum contraception: Depo Provera  Newborn Data: Live born female  Birth Weight: 7 lb 4.2 oz (3295 g) APGAR: 8, 8  Newborn Delivery   Birth date/time:  10/22/2018 11:13:00 Delivery type:  C-Section, Low Transverse Trial of labor:  No C-section categorization:  Repeat     Baby Feeding: Bottle Disposition:home with mother   10/24/2018 Arabella MerlesKimberly D Jammy Plotkin, CNM  9:05 AM

## 2018-10-24 NOTE — Discharge Instructions (Signed)

## 2018-10-24 NOTE — Progress Notes (Signed)
POSTPARTUM PROGRESS NOTE  Post Partum Day 2  Subjective:  Samantha Guzman is a 32 y.o. [id number] s/p rLTCS at 323[name]9w2d.  She reports she is doing well. No acute events overnight. She denies any problems with ambulating, voiding or po intake. Denies nausea or vomiting.  Pain is well controlled.  No vaginal bleeding. No dizziness when standing/walking.   Objective: Blood pressure 138/69, pulse 75, temperature 97.6 F (36.4 C), temperature source Oral, resp. rate 18, height 5\' 6"  (1.676 m), weight 120.7 kg, last menstrual period 01/14/2018, SpO2 100 %, unknown if currently breastfeeding.  Physical Exam:  General: alert, cooperative and no distress Chest: no respiratory distress Heart:regular rate, distal pulses intact Abdomen: soft, nontender Uterine Fundus: firm, appropriately tender DVT Evaluation: No calf swelling or tenderness Extremities: No edema Skin: warm, dry Incision: Bandage dry, no surrounding erythema or induration  Recent Labs    10/22/18 2103 10/23/18 0557  HGB 8.1* 7.8*  HCT 25.8* 24.7*    Assessment/Plan: Samantha NapoleonRenee Guzman is a 32 y.o. [id number] s/p rLTCS at 8721w2d   PPD#2 - Doing well Routine postpartum care Anemia: Hgb 7.8 yesterday, started ferrous sulfate Contraception: Depo Feeding: Bottle Dispo: Plan for discharge possibly later today.   LOS: 2 days   Trenda Mootslizabeth Grayden Guzman MS3 10/24/2018, 7:24 AM

## 2018-11-05 ENCOUNTER — Ambulatory Visit: Payer: Medicaid Other | Admitting: Internal Medicine

## 2018-11-15 ENCOUNTER — Encounter: Payer: Self-pay | Admitting: Medical

## 2018-11-15 ENCOUNTER — Ambulatory Visit (INDEPENDENT_AMBULATORY_CARE_PROVIDER_SITE_OTHER): Payer: Medicaid Other | Admitting: Medical

## 2018-11-15 VITALS — BP 136/88 | HR 88 | Temp 98.5°F | Wt 239.0 lb

## 2018-11-15 DIAGNOSIS — Z98891 History of uterine scar from previous surgery: Secondary | ICD-10-CM

## 2018-11-15 NOTE — Patient Instructions (Signed)

## 2018-11-15 NOTE — Progress Notes (Signed)
Samantha Guzman is a 33 y.o. [phone] who present to [facility] today for an incision check. She had RLTCS (third) on 10/22/18. She denies any complaints today.   BP 136/88   Pulse 88   Temp 98.5 F (36.9 C) (Oral)   Wt 239 lb (108.4 kg)   LMP 01/14/2018   BMI 38.58 kg/m  Physical Exam  Constitutional: She is oriented to person, place, and time. She appears well-developed and well-nourished. No distress.  Cardiovascular: Normal rate.  Pulmonary/Chest: Effort normal.  Abdominal: Soft. She exhibits no distension. There is no abdominal tenderness.  Incision is healing well. No erythema, edema, drainage or bleeding noted. Minimal tenderness to palpation.   Neurological: She is alert and oriented to person, place, and time.  Skin: Skin is warm. No erythema.  Psychiatric: She has a normal mood and affect.  Vitals reviewed.  A:  S/P Cesarean section   P:  Lifting restrictions reviewed Warning signs for worsening condition disucssed Patient to return to [facility] 11/20/18 as scheduled for routine PP exam or sooner PRN   Marny Lowenstein, PA-C 11/15/2018 9:31 AM

## 2018-11-15 NOTE — Progress Notes (Signed)
Patient presents of Incision Check today   Pt had C-Section on 10/22/18  Pt has no complaints today.

## 2018-11-20 ENCOUNTER — Ambulatory Visit: Payer: Medicaid Other | Admitting: Obstetrics and Gynecology

## 2018-11-27 ENCOUNTER — Ambulatory Visit (INDEPENDENT_AMBULATORY_CARE_PROVIDER_SITE_OTHER): Payer: Medicaid Other | Admitting: Certified Nurse Midwife

## 2018-11-27 ENCOUNTER — Encounter: Payer: Self-pay | Admitting: Certified Nurse Midwife

## 2018-11-27 DIAGNOSIS — I10 Essential (primary) hypertension: Secondary | ICD-10-CM

## 2018-11-27 DIAGNOSIS — Z1389 Encounter for screening for other disorder: Secondary | ICD-10-CM | POA: Diagnosis not present

## 2018-11-27 MED ORDER — AMLODIPINE BESYLATE 5 MG PO TABS: 5.0000 mg | ORAL_TABLET | Freq: Every day | ORAL | 0 refills | Status: AC

## 2018-11-27 NOTE — Progress Notes (Signed)
Post Partum Exam  Samantha Guzman is a 33 y.o. [phone] female who presents for a postpartum visit. She is 5 weeks postpartum following a low cervical transverse Cesarean section. I have fully reviewed the prenatal and intrapartum course. The delivery was at 39.2 gestational weeks.  Anesthesia: spinal. Postpartum course has been doing well. Baby's course has been doing well. Baby is feeding by bottle - [employer]-Gerber. Bleeding thin lochia. Bowel function is normal. Bladder function is normal. Patient is not sexually active. Contraception method is abstinence.  Postpartum depression screening:neg, score 2.  The following portions of the patient's history were reviewed and updated as appropriate: allergies, current medications, past family history, past medical history, past surgical history and problem list. Last pap smear done 03/2018 and was Normal  Review of Systems Pertinent items noted in HPI and remainder of comprehensive ROS otherwise negative.    Objective:  Blood pressure (!) 150/92, pulse 65, weight 239 lb (108.4 kg), last menstrual period 01/14/2018, unknown if currently breastfeeding.  General:  alert, cooperative and no distress   Breasts:  inspection negative, no nipple discharge or bleeding, no masses or nodularity palpable  Lungs: clear to auscultation bilaterally  Heart:  regular rate and rhythm and S1, S2 normal  Abdomen: soft, non-tender; bowel sounds normal; no masses,  no organomegaly   Vulva:  not evaluated  Vagina: not evaluated  Cervix:  not evaluated  Corpus: not examined  Adnexa:  not evaluated  Rectal Exam: Not performed.        Assessment/Plan:  1. Postpartum care and examination - Normal postpartum exam other than HTN. Pap smear not done at today's visit.  - BP 145/88 repeat 150/92 - Patient does not want to be on birth control, reports she is using abstinence until her and fiance get married next month but after does not want medication  - Educated  and discussed natural family planning and use of withdraw. Discussed importance of allowing body to heal at least a year prior to getting pregnant again, patient verbalizes understanding   2. Essential hypertension - Patient denies HA, vision changes or RUQ pain  - She was prescribed HCTZ at discharge from hospital but reports stopping medication due to reaction, has not been on medication since 1 week PP  - Educated and discussed importance of BP control especially in African Americans with fam hx of HTN.  - PCP to manage BP after 1 week BP check  - amLODipine (NORVASC) 5 MG tablet; Take 1 tablet (5 mg total) by mouth daily.  Dispense: 30 tablet; Refill: 0  Follow up in: 1 week for BP check or as needed.  PCP to manage BP after 1 week BP check   Sharyon Cable, CNM 11/27/18, 9:59 AM

## 2018-12-04 ENCOUNTER — Ambulatory Visit: Payer: Medicaid Other

## 2018-12-04 VITALS — BP 149/91 | HR 58

## 2018-12-04 DIAGNOSIS — Z0130 Encounter for examination of blood pressure without abnormal findings: Secondary | ICD-10-CM

## 2018-12-04 NOTE — Progress Notes (Signed)
Pt delivered 10/22/18 via c section. Currently prescribed Norvasc 5 mg daily. Pt states she did not take her medication today but that she has been taking it consistently. BP is elevated today, but pt denies HA, blurry vision, or abdominal pain. Advised pt to continue taking her medication as prescribed and make an appointment with her PCP for follow up management for hypertension. Pt verbalized understanding.

## 2019-08-11 ENCOUNTER — Other Ambulatory Visit: Payer: Self-pay | Admitting: *Deleted

## 2019-08-11 DIAGNOSIS — F329 Major depressive disorder, single episode, unspecified: Secondary | ICD-10-CM

## 2019-08-11 DIAGNOSIS — F32A Depression, unspecified: Secondary | ICD-10-CM

## 2019-08-11 NOTE — Progress Notes (Signed)
Pt called to office stating she was having problems at home and needs a counselor to talk to, would like referral. Pt states she had baby earlier this year, problems with marriage and just feels she needs to talk with someone.   Referral has been placed for pt to see Samantha Guzman at [place] office.  Pt made aware she should be contacted for an appt.  Advised to contact our office if she does not hear back re: scheduling.
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
## 2019-08-19 ENCOUNTER — Other Ambulatory Visit: Payer: Self-pay

## 2019-08-19 ENCOUNTER — Telehealth (INDEPENDENT_AMBULATORY_CARE_PROVIDER_SITE_OTHER): Payer: Self-pay | Admitting: Clinical

## 2019-08-19 ENCOUNTER — Ambulatory Visit: Payer: Medicaid Other | Admitting: Obstetrics

## 2019-08-19 DIAGNOSIS — F329 Major depressive disorder, single episode, unspecified: Secondary | ICD-10-CM

## 2019-08-19 DIAGNOSIS — F32A Depression, unspecified: Secondary | ICD-10-CM

## 2019-08-19 DIAGNOSIS — F430 Acute stress reaction: Secondary | ICD-10-CM

## 2019-08-19 IMAGING — US US MFM OB FOLLOW-UP
1 series · 14 of 28 positions shown · non-contrast
Comparison: none

[Series 1: us mfm ob follow-up · 14 of 46 slices shown]
[im 2/46]
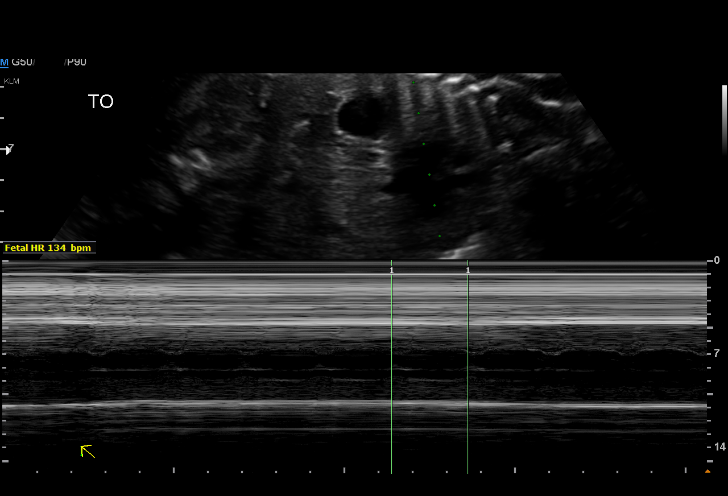
[im 6/46]
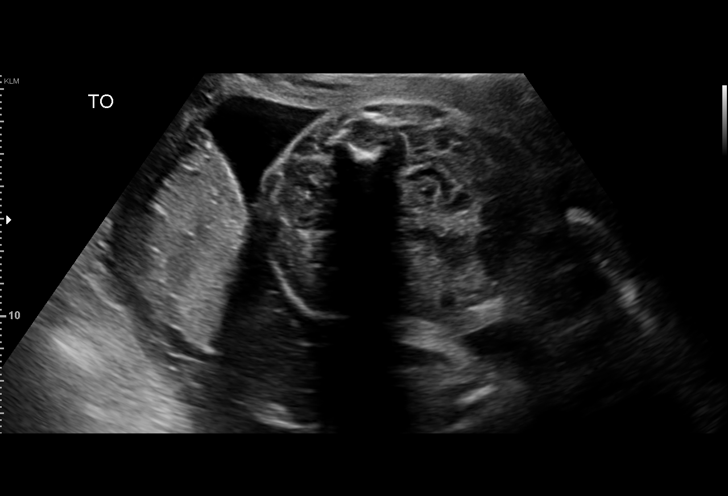
[im 9/46]
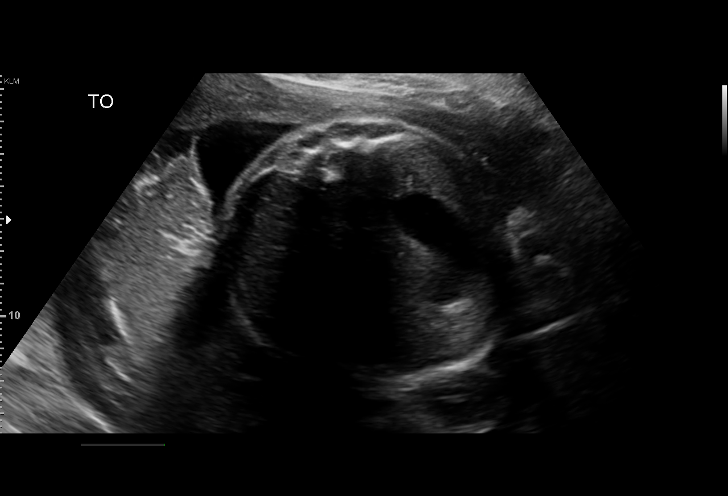
[im 12/46]
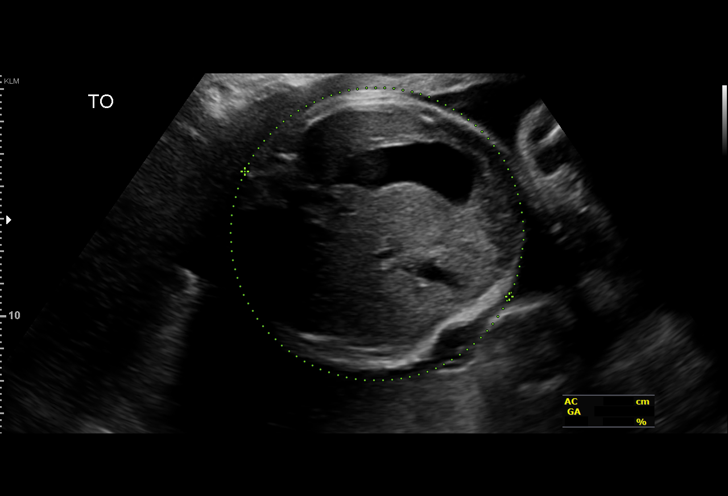
[im 16/46]
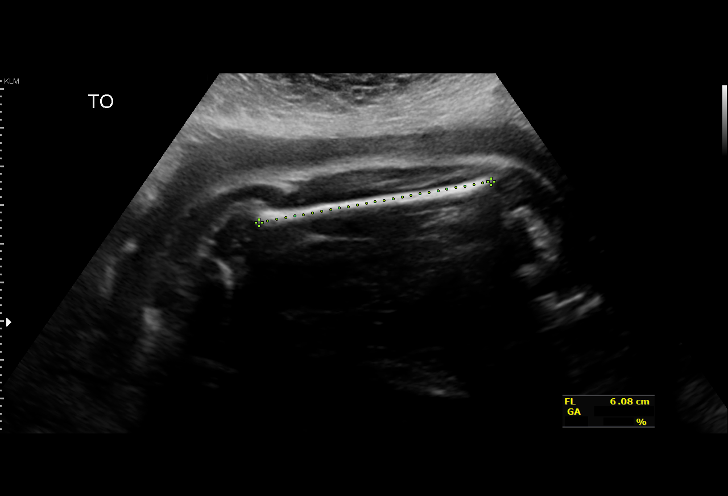
[im 19/46]
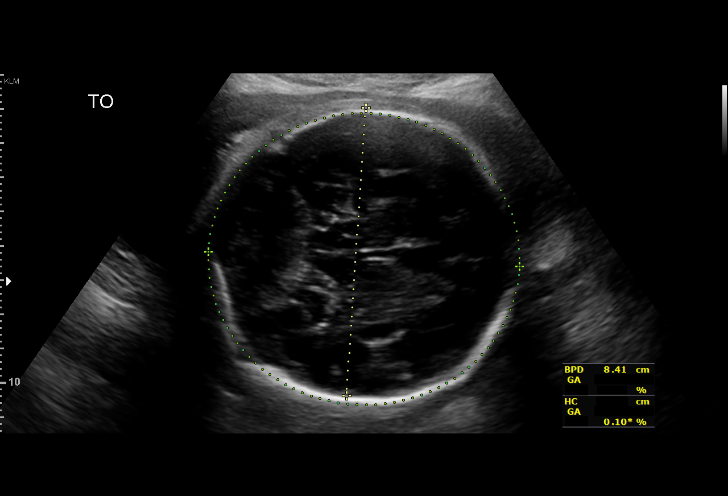
[im 22/46]
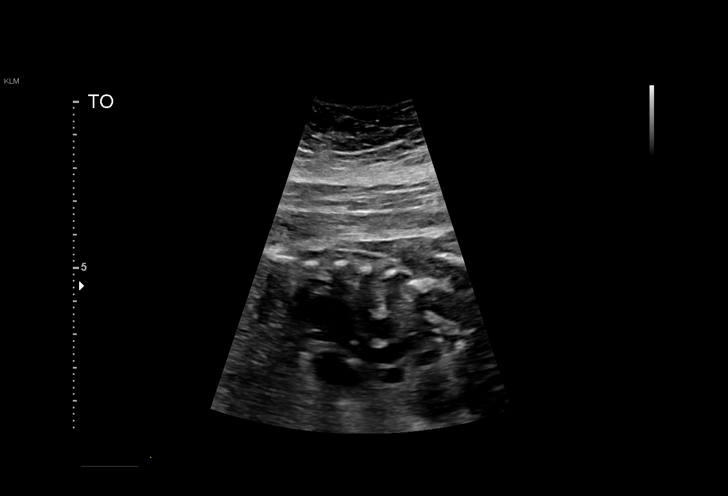
[im 26/46]
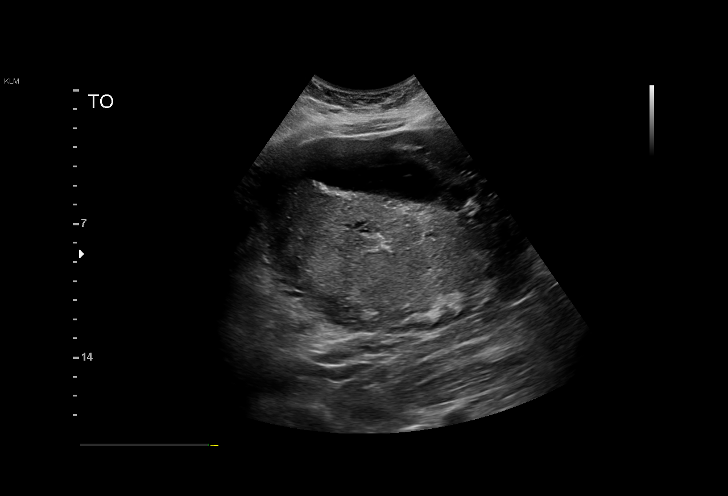
[im 29/46]
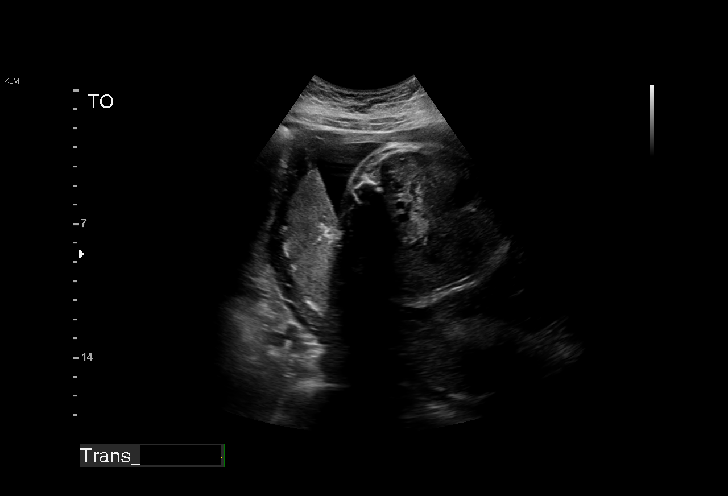
[im 32/46]
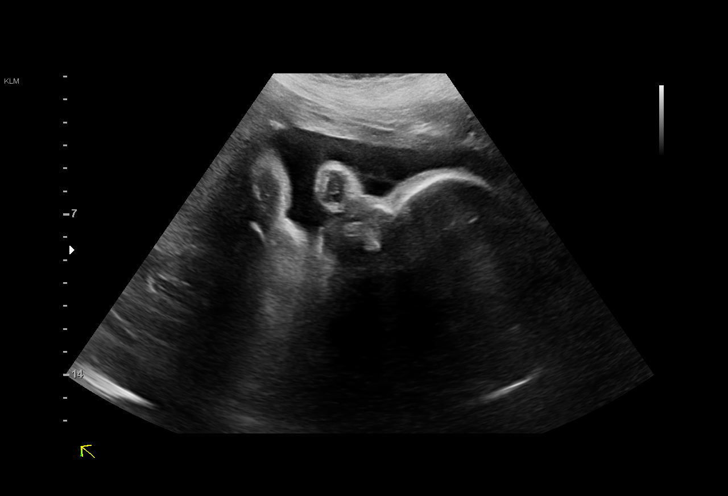
[im 36/46]
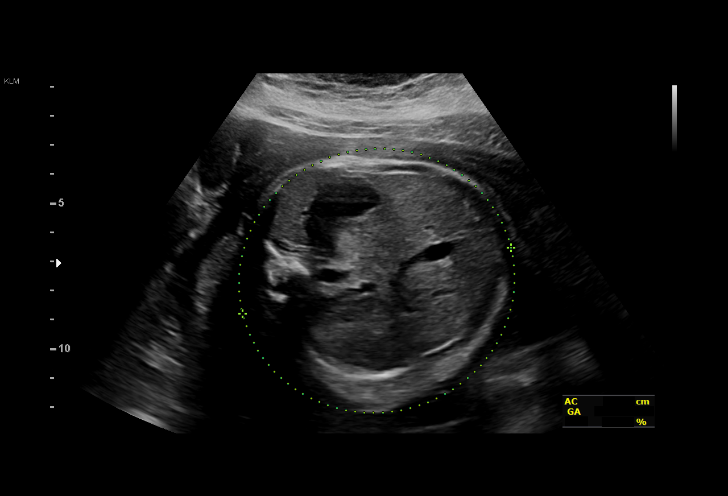
[im 39/46]
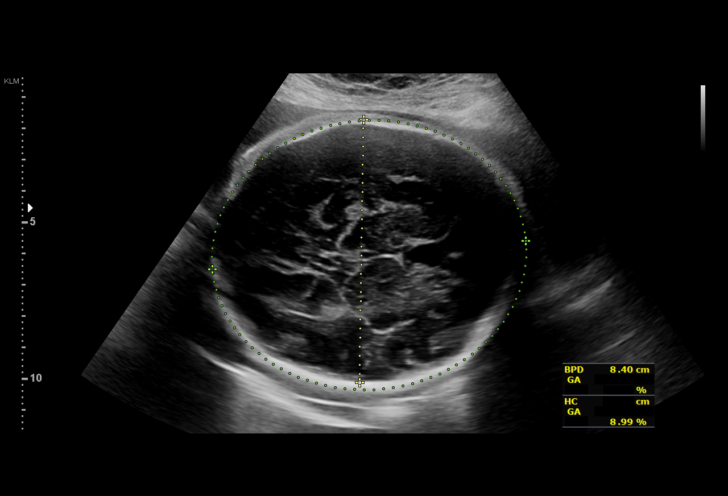
[im 42/46]
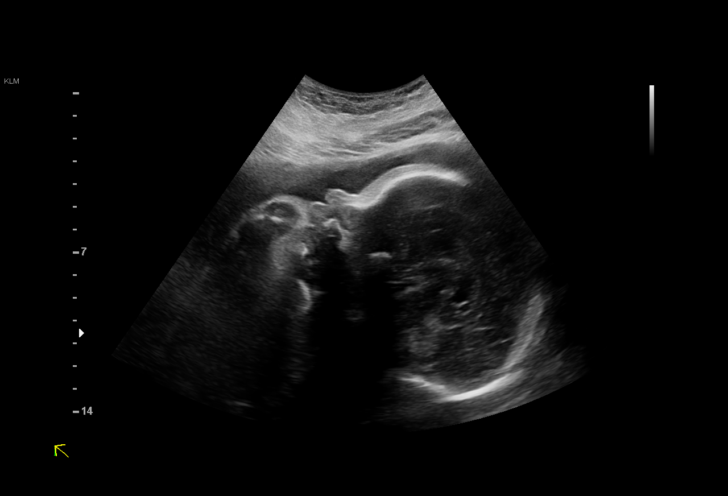
[im 46/46]
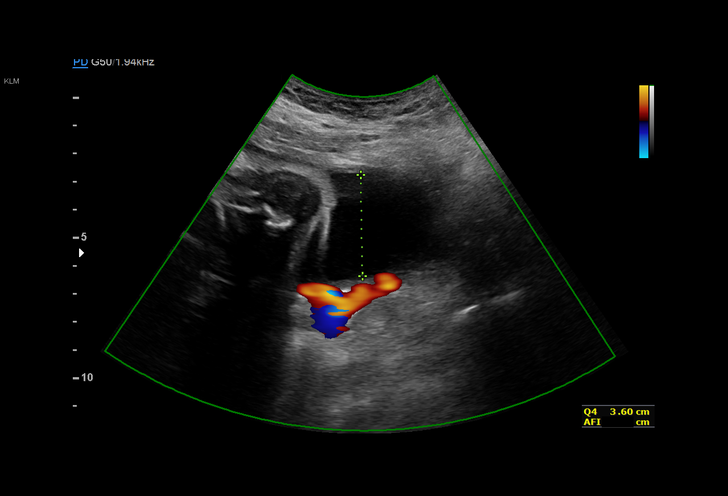

[14 of 28 positions shown; findings below may reference images not displayed]

Indications

Antenatal follow-up for nonvisualized fetal
anatomy
Cervical cerclage suture present, second
trimester
Previous cesarean delivery, antepartum x2
Poor obstetric history: Previous preterm
delivery, antepartum
Obesity complicating pregnancy, second
trimester
32 weeks gestation of pregnancy
Vital Signs

BMI:
Fetal Evaluation

Num Of Fetuses:          1
Fetal Heart Rate(bpm):   134
Cardiac Activity:        Observed
Presentation:            Cephalic
Placenta:                Posterior
P. Cord Insertion:       Previously Visualized

Amniotic Fluid
AFI FV:      Within normal limits

AFI Sum(cm)     %Tile       Largest Pocket(cm)
11.85           31

RUQ(cm)       RLQ(cm)       LUQ(cm)        LLQ(cm)
3.58
Biometry

BPD:      84.3  mm     G. Age:  33w 6d         75  %    CI:        83.95   %    70 - 86
FL/HC:       21.1  %    19.9 -
HC:        290  mm     G. Age:  31w 6d          5  %    HC/AC:       1.02       0.96 -
AC:      284.2  mm     G. Age:  32w 3d         39  %    FL/BPD:      72.6  %    71 - 87
FL:       61.2  mm     G. Age:  31w 5d         15  %    FL/AC:       21.5  %    20 - 24
HUM:      53.5  mm     G. Age:  31w 1d         23  %
LV:        3.3  mm

Est. FW:    9202   gm     4 lb 5 oz     47  %
OB History

Gravidity:    5         Term:   2        Prem:   2        SAB:   0
TOP:          0       Ectopic:  0        Living: 2
Gestational Age

LMP:           32w 6d        Date:  01/12/18                 EDD:   10/19/18
U/S Today:     32w 3d                                        EDD:   10/22/18
Best:          32w 6d     Det. By:  LMP  (01/12/18)          EDD:   10/19/18
Anatomy

Cranium:               Appears normal         Aortic Arch:            Previously seen
Cavum:                 Appears normal         Ductal Arch:            Previously seen
Ventricles:            Appears normal         Diaphragm:              Previously seen
Choroid Plexus:        Previously seen        Stomach:                Appears normal, left
sided
Cerebellum:            Previously seen        Abdomen:                Appears normal
Posterior Fossa:       Previously seen        Abdominal Wall:         Previously seen
Nuchal Fold:           Previously seen        Cord Vessels:           Previously seen
Face:                  Orbits and profile     Kidneys:                Appear normal
previously seen
Lips:                  Previously seen        Bladder:                Appears normal
Thoracic:              Appears normal         Spine:                  Previously seen
Heart:                 Appears normal         Upper Extremities:      Previously seen
(4CH, axis, and situs
RVOT:                  Appears normal         Lower Extremities:      Previously seen
LVOT:                  Previously seen

Other:  Female gender previously seen.Technically Technically difficult due
to fetal position.
Cervix Uterus Adnexa

Cervix
Not visualized (advanced GA >93wks)
Impression

Normal interval growth.
Recommendations

Follow up growth in 4-6 weeks.

## 2019-09-02 ENCOUNTER — Ambulatory Visit: Payer: Self-pay | Admitting: Clinical

## 2019-09-02 ENCOUNTER — Other Ambulatory Visit: Payer: Self-pay

## 2019-09-02 DIAGNOSIS — Z5329 Procedure and treatment not carried out because of patient's decision for other reasons: Secondary | ICD-10-CM

## 2019-09-02 DIAGNOSIS — Z91199 Patient's noncompliance with other medical treatment and regimen due to unspecified reason: Secondary | ICD-10-CM

## 2019-09-02 NOTE — BH Specialist Note (Signed)
Pt did not arrive to video visit and did not answer the phone; Left HIPPA-compliant message to call back Roselyn Reef from [facility] [employer] at [phone], and left MyChart message for patient.   [place] via Telemedicine Video Visit  09/02/2019 Samantha Guzman [id number]  Samantha Guzman

## 2020-11-23 ENCOUNTER — Ambulatory Visit (INDEPENDENT_AMBULATORY_CARE_PROVIDER_SITE_OTHER): Payer: Self-pay | Admitting: Obstetrics

## 2020-11-23 ENCOUNTER — Other Ambulatory Visit: Payer: Self-pay

## 2020-11-23 ENCOUNTER — Encounter: Payer: Self-pay | Admitting: Obstetrics

## 2020-11-23 DIAGNOSIS — R300 Dysuria: Secondary | ICD-10-CM

## 2020-11-23 LAB — POCT URINALYSIS DIPSTICK: pH, UA: 7.5 (ref 5.0–8.0)

## 2020-11-23 MED ORDER — NITROFURANTOIN MONOHYD MACRO 100 MG PO CAPS: 100.0000 mg | ORAL_CAPSULE | Freq: Two times a day (BID) | ORAL | 2 refills | Status: DC

## 2020-11-23 MED ORDER — NITROFURANTOIN MONOHYD MACRO 100 MG PO CAPS: 100.0000 mg | ORAL_CAPSULE | Freq: Two times a day (BID) | ORAL | 2 refills | Status: AC

## 2020-11-23 NOTE — Progress Notes (Signed)
Patient ID: Samantha Guzman, female   DOB: 1985-11-19, 35 y.o.   MRN: [id number]  Chief Complaint  Patient presents with  . Urinary Tract Infection    HPI Samantha Guzman is a 35 y.o. female.  Complains of urinary frequency, burning and dribbling small amount with voiding. HPI  Past Medical History:  Diagnosis Date  . Anxiety   . Depression    brief hx, ok now  . GERD (gastroesophageal reflux disease)   . Herpes genitalia 09/19/2018   Last outbreak was > 6 yrs ago  . Hyperemesis   . Incompetent cervix in pregnancy   . Infection    UTI  . No significant past medical history   . Vertigo     Past Surgical History:  Procedure Laterality Date  . CERVICAL CERCLAGE N/A 08/09/2013   Procedure: CERCLAGE CERVICAL;  Surgeon: Brock Bad, MD;  Location: [facility];  Service: Gynecology;  Laterality: N/A;  . CERVICAL CERCLAGE N/A 04/26/2018   Procedure: CERCLAGE CERVICAL;  Surgeon: Reva Bores, MD;  Location: [facility];  Service: Gynecology;  Laterality: N/A;  . CESAREAN SECTION    . CESAREAN SECTION N/A 02/02/2014   Procedure: REPEAT CESAREAN SECTION;  Surgeon: Brock Bad, MD;  Location: [facility];  Service: Obstetrics;  Laterality: N/A;  . CESAREAN SECTION N/A 10/22/2018   Procedure: CESAREAN SECTION;  Surgeon: Levie Heritage, DO;  Location: [facility] BIRTHING SUITES;  Service: Obstetrics;  Laterality: N/A;    Family History  Problem Relation Age of Onset  . Stroke Father   . Heart disease Father     Social History Social History   Tobacco Use  . Smoking status: Former Smoker    Types: Cigarettes    Quit date: 06/06/2009    Years since quitting: 11.4  . Smokeless tobacco: Never Used  Vaping Use  . Vaping Use: Never used  Substance Use Topics  . Alcohol use: No  . Drug use: No    Allergies  Allergen Reactions  . Tramadol Itching    Current Outpatient Medications  Medication Sig Dispense Refill  . amLODipine (NORVASC) 5 MG tablet Take 1 tablet (5 mg total) by mouth daily.  30 tablet 0  . ferrous sulfate 325 (65 FE) MG tablet Take 1 tablet (325 mg total) by mouth 2 (two) times daily with a meal. 60 tablet 3  . hydrochlorothiazide (HYDRODIURIL) 25 MG tablet Take 1 tablet (25 mg total) by mouth daily. (Patient not taking: Reported on 12/04/2018) 30 tablet 1  . ibuprofen (ADVIL,MOTRIN) 600 MG tablet Take 1 tablet (600 mg total) by mouth every 6 (six) hours as needed. 30 tablet 0  . oxyCODONE (OXY IR/ROXICODONE) 5 MG immediate release tablet Take 1-2 tablets (5-10 mg total) by mouth every 4 (four) hours as needed for moderate pain. 30 tablet 0   No current facility-administered medications for this visit.    Review of Systems Review of Systems Constitutional: negative for fatigue and weight loss Respiratory: negative for cough and wheezing Cardiovascular: negative for chest pain, fatigue and palpitations Gastrointestinal: negative for abdominal pain and change in bowel habits Genitourinary: positive for urinary requency, burning and dribbling Integument/breast: negative for nipple discharge Musculoskeletal:negative for myalgias Neurological: negative for gait problems and tremors Behavioral/Psych: negative for abusive relationship, depression Endocrine: negative for temperature intolerance      unknown if currently breastfeeding.  Physical Exam Physical Exam General:   alert and no distress  Skin:   no rash or abnormalities  Lungs:   clear  to auscultation bilaterally  Heart:   regular rate and rhythm, S1, S2 normal, no murmur, click, rub or gallop                 Back: no cva tenderness                Abdomen: no suprapubic tenderness  50% of 15 min visit spent on counseling and coordination of care.   Data Reviewed Urine Dipstick Urinalysis Results for Samantha, Guzman (MRN [id number]) as of 11/23/2020 14:51  Ref. Range 11/23/2020 14:48  Ketones, UA Unknown Moderate  pH, UA Latest Ref Range: 5.0 - 8.0  7.5  RBC, UA Unknown Moderate   Assessment and  Plan    1. Dysuria Rx: - nitrofurantoin, macrocrystal-monohydrate, (MACROBID) 100 MG capsule; Take 1 capsule (100 mg total) by mouth 2 (two) times daily. 1 po BID x 7days  Dispense: 14 capsule; Refill: 2 - Urine Culture - POCT urinalysis dipstick    Plan   Follow up prn  Brock Bad, MD 11/23/2020 2:41 PM

## 2020-11-26 ENCOUNTER — Other Ambulatory Visit: Payer: Self-pay | Admitting: Obstetrics

## 2020-11-26 LAB — URINE CULTURE

## 2021-02-22 ENCOUNTER — Telehealth: Payer: Self-pay | Admitting: Obstetrics and Gynecology

## 2021-02-22 MED ORDER — METRONIDAZOLE 500 MG PO TABS: 500.0000 mg | ORAL_TABLET | Freq: Two times a day (BID) | ORAL | 0 refills | Status: AC

## 2021-02-22 NOTE — Telephone Encounter (Signed)
Patient called requesting treatment for BV.  Rx routed to pharmacy per protocol. Patient states she has discharge with odor.

## 2022-06-14 ENCOUNTER — Telehealth: Payer: Self-pay | Admitting: *Deleted
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
## 2022-08-16 ENCOUNTER — Ambulatory Visit: Payer: Self-pay | Admitting: Obstetrics

## 2023-04-05 ENCOUNTER — Encounter (HOSPITAL_COMMUNITY): Payer: Self-pay

## 2023-04-05 ENCOUNTER — Emergency Department (HOSPITAL_COMMUNITY)
Admission: EM | Admit: 2023-04-05 | Discharge: 2023-04-05 | Disposition: A | Discharge status: Home / Self Care | Payer: Medicaid Other | Attending: Emergency Medicine | Admitting: Emergency Medicine

## 2023-04-05 DIAGNOSIS — T23201A Burn of second degree of right hand, unspecified site, initial encounter: Secondary | ICD-10-CM | POA: Insufficient documentation

## 2023-04-05 DIAGNOSIS — T23202A Burn of second degree of left hand, unspecified site, initial encounter: Secondary | ICD-10-CM | POA: Insufficient documentation

## 2023-04-05 DIAGNOSIS — S6992XA Unspecified injury of left wrist, hand and finger(s), initial encounter: Secondary | ICD-10-CM | POA: Diagnosis present

## 2023-04-05 DIAGNOSIS — Y93G3 Activity, cooking and baking: Secondary | ICD-10-CM | POA: Diagnosis not present

## 2023-04-05 DIAGNOSIS — T23209A Burn of second degree of unspecified hand, unspecified site, initial encounter: Secondary | ICD-10-CM

## 2023-04-05 DIAGNOSIS — X102XXA Contact with fats and cooking oils, initial encounter: Secondary | ICD-10-CM | POA: Diagnosis not present

## 2023-04-05 DIAGNOSIS — T25011A Burn of unspecified degree of right ankle, initial encounter: Secondary | ICD-10-CM | POA: Diagnosis not present

## 2023-04-05 DIAGNOSIS — Z23 Encounter for immunization: Secondary | ICD-10-CM | POA: Insufficient documentation

## 2023-04-05 DIAGNOSIS — T25111A Burn of first degree of right ankle, initial encounter: Secondary | ICD-10-CM

## 2023-04-05 MED ORDER — TETANUS-DIPHTH-ACELL PERTUSSIS 5-2.5-18.5 LF-MCG/0.5 IM SUSY
0.5000 mL | PREFILLED_SYRINGE | Freq: Once | INTRAMUSCULAR | Status: AC
  Administered 2023-04-05: 0.5 mL via INTRAMUSCULAR
  Filled 2023-04-05: qty 0.5

## 2023-04-05 MED ORDER — BACITRACIN ZINC 500 UNIT/GM EX OINT
TOPICAL_OINTMENT | Freq: Once | CUTANEOUS | Status: AC
  Administered 2023-04-05: 1 via TOPICAL
  Filled 2023-04-05: qty 1.8

## 2023-04-05 MED ORDER — IBUPROFEN 600 MG PO TABS: 600.0000 mg | ORAL_TABLET | Freq: Three times a day (TID) | ORAL | 0 refills | Status: AC | PRN
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
